# Patient Record
Sex: Male | Born: 1935
Health system: Southern US, Community
[De-identification: ages and names within clinical notes are randomized; demographics above are authoritative.]

## PROBLEM LIST (undated history)

## (undated) DIAGNOSIS — K222 Esophageal obstruction: Secondary | ICD-10-CM

## (undated) DIAGNOSIS — I739 Peripheral vascular disease, unspecified: Secondary | ICD-10-CM

## (undated) DIAGNOSIS — I722 Aneurysm of renal artery: Secondary | ICD-10-CM

## (undated) DIAGNOSIS — K219 Gastro-esophageal reflux disease without esophagitis: Secondary | ICD-10-CM

## (undated) DIAGNOSIS — Z9289 Personal history of other medical treatment: Secondary | ICD-10-CM

## (undated) DIAGNOSIS — I480 Paroxysmal atrial fibrillation: Secondary | ICD-10-CM

## (undated) DIAGNOSIS — H9190 Unspecified hearing loss, unspecified ear: Secondary | ICD-10-CM

## (undated) DIAGNOSIS — I25810 Atherosclerosis of coronary artery bypass graft(s) without angina pectoris: Secondary | ICD-10-CM

## (undated) DIAGNOSIS — G629 Polyneuropathy, unspecified: Secondary | ICD-10-CM

## (undated) DIAGNOSIS — K449 Diaphragmatic hernia without obstruction or gangrene: Secondary | ICD-10-CM

## (undated) DIAGNOSIS — I10 Essential (primary) hypertension: Secondary | ICD-10-CM

## (undated) DIAGNOSIS — C140 Malignant neoplasm of pharynx, unspecified: Secondary | ICD-10-CM

## (undated) DIAGNOSIS — D696 Thrombocytopenia, unspecified: Secondary | ICD-10-CM

## (undated) DIAGNOSIS — Z951 Presence of aortocoronary bypass graft: Secondary | ICD-10-CM

## (undated) DIAGNOSIS — K760 Fatty (change of) liver, not elsewhere classified: Secondary | ICD-10-CM

## (undated) DIAGNOSIS — N400 Enlarged prostate without lower urinary tract symptoms: Secondary | ICD-10-CM

## (undated) HISTORY — DX: Malignant neoplasm of pharynx, unspecified: C14.0

## (undated) HISTORY — DX: Gastro-esophageal reflux disease without esophagitis: K21.9

## (undated) HISTORY — DX: Aneurysm of renal artery: I72.2

## (undated) HISTORY — PX: OTHER SURGICAL HISTORY: SHX169

## (undated) HISTORY — DX: Presence of aortocoronary bypass graft: Z95.1

## (undated) HISTORY — DX: Atherosclerosis of coronary artery bypass graft(s) without angina pectoris: I25.810

## (undated) HISTORY — DX: Benign prostatic hyperplasia without lower urinary tract symptoms: N40.0

## (undated) HISTORY — DX: Esophageal obstruction: K22.2

## (undated) HISTORY — DX: Peripheral vascular disease, unspecified: I73.9

## (undated) HISTORY — DX: Polyneuropathy, unspecified: G62.9

## (undated) HISTORY — DX: Fatty (change of) liver, not elsewhere classified: K76.0

## (undated) HISTORY — PX: TONSILLECTOMY: SUR1361

## (undated) HISTORY — DX: Diaphragmatic hernia without obstruction or gangrene: K44.9

## (undated) HISTORY — DX: Essential (primary) hypertension: I10

## (undated) HISTORY — DX: Paroxysmal atrial fibrillation: I48.0

## (undated) HISTORY — DX: Gilbert syndrome: E80.4

## (undated) HISTORY — DX: Personal history of other medical treatment: Z92.89

---

## 2000-03-14 ENCOUNTER — Encounter: Payer: Self-pay | Admitting: Emergency Medicine

## 2000-03-14 ENCOUNTER — Emergency Department (HOSPITAL_COMMUNITY): Admission: EM | Admit: 2000-03-14 | Discharge: 2000-03-14 | Payer: Self-pay | Admitting: Emergency Medicine

## 2000-11-19 ENCOUNTER — Ambulatory Visit (HOSPITAL_COMMUNITY): Admission: RE | Admit: 2000-11-19 | Discharge: 2000-11-19 | Payer: Self-pay | Admitting: Family Medicine

## 2000-11-19 ENCOUNTER — Encounter: Payer: Self-pay | Admitting: Family Medicine

## 2001-04-20 ENCOUNTER — Ambulatory Visit (HOSPITAL_COMMUNITY): Admission: RE | Admit: 2001-04-20 | Discharge: 2001-04-20 | Payer: Self-pay | Admitting: Internal Medicine

## 2001-05-31 ENCOUNTER — Encounter: Payer: Self-pay | Admitting: Family Medicine

## 2001-05-31 ENCOUNTER — Ambulatory Visit (HOSPITAL_COMMUNITY): Admission: RE | Admit: 2001-05-31 | Discharge: 2001-05-31 | Payer: Self-pay | Admitting: Family Medicine

## 2001-07-08 ENCOUNTER — Ambulatory Visit (HOSPITAL_COMMUNITY): Admission: RE | Admit: 2001-07-08 | Discharge: 2001-07-08 | Payer: Self-pay | Admitting: Internal Medicine

## 2001-12-03 ENCOUNTER — Ambulatory Visit (HOSPITAL_COMMUNITY): Admission: RE | Admit: 2001-12-03 | Discharge: 2001-12-03 | Payer: Self-pay | Admitting: Internal Medicine

## 2001-12-03 ENCOUNTER — Encounter: Payer: Self-pay | Admitting: Internal Medicine

## 2002-03-30 ENCOUNTER — Ambulatory Visit (HOSPITAL_COMMUNITY): Admission: RE | Admit: 2002-03-30 | Discharge: 2002-03-30 | Payer: Self-pay | Admitting: Internal Medicine

## 2002-03-30 ENCOUNTER — Encounter: Payer: Self-pay | Admitting: Internal Medicine

## 2002-11-02 ENCOUNTER — Ambulatory Visit (HOSPITAL_COMMUNITY): Admission: RE | Admit: 2002-11-02 | Discharge: 2002-11-02 | Payer: Self-pay | Admitting: Family Medicine

## 2002-11-02 ENCOUNTER — Encounter: Payer: Self-pay | Admitting: Family Medicine

## 2002-11-18 ENCOUNTER — Ambulatory Visit (HOSPITAL_COMMUNITY): Admission: RE | Admit: 2002-11-18 | Discharge: 2002-11-18 | Payer: Self-pay | Admitting: Otolaryngology

## 2002-11-18 ENCOUNTER — Encounter: Payer: Self-pay | Admitting: Otolaryngology

## 2003-02-28 ENCOUNTER — Ambulatory Visit (HOSPITAL_COMMUNITY): Admission: RE | Admit: 2003-02-28 | Discharge: 2003-02-28 | Payer: Self-pay | Admitting: Internal Medicine

## 2003-03-30 ENCOUNTER — Ambulatory Visit (HOSPITAL_COMMUNITY): Admission: RE | Admit: 2003-03-30 | Discharge: 2003-03-30 | Payer: Self-pay | Admitting: Internal Medicine

## 2003-12-20 ENCOUNTER — Ambulatory Visit (HOSPITAL_COMMUNITY): Admission: RE | Admit: 2003-12-20 | Discharge: 2003-12-20 | Payer: Self-pay | Admitting: Family Medicine

## 2004-02-04 DIAGNOSIS — I25810 Atherosclerosis of coronary artery bypass graft(s) without angina pectoris: Secondary | ICD-10-CM

## 2004-02-04 DIAGNOSIS — Z951 Presence of aortocoronary bypass graft: Secondary | ICD-10-CM

## 2004-02-04 HISTORY — PX: ESOPHAGOGASTRODUODENOSCOPY: SHX1529

## 2004-02-04 HISTORY — DX: Atherosclerosis of coronary artery bypass graft(s) without angina pectoris: I25.810

## 2004-02-04 HISTORY — PX: COLONOSCOPY: SHX174

## 2004-02-04 HISTORY — DX: Presence of aortocoronary bypass graft: Z95.1

## 2004-04-03 HISTORY — PX: CORONARY ARTERY BYPASS GRAFT: SHX141

## 2004-04-18 ENCOUNTER — Inpatient Hospital Stay (HOSPITAL_COMMUNITY): Admission: EM | Admit: 2004-04-18 | Discharge: 2004-04-30 | Payer: Self-pay | Admitting: Emergency Medicine

## 2004-04-18 ENCOUNTER — Encounter: Payer: Self-pay | Admitting: Emergency Medicine

## 2004-05-24 ENCOUNTER — Encounter: Admission: RE | Admit: 2004-05-24 | Discharge: 2004-05-24 | Payer: Self-pay | Admitting: Cardiothoracic Surgery

## 2004-06-11 ENCOUNTER — Ambulatory Visit (HOSPITAL_COMMUNITY): Admission: RE | Admit: 2004-06-11 | Discharge: 2004-06-11 | Payer: Self-pay | Admitting: Internal Medicine

## 2004-07-22 ENCOUNTER — Ambulatory Visit: Payer: Self-pay | Admitting: Internal Medicine

## 2004-08-01 ENCOUNTER — Encounter: Payer: Self-pay | Admitting: Internal Medicine

## 2004-08-01 ENCOUNTER — Ambulatory Visit (HOSPITAL_COMMUNITY): Admission: RE | Admit: 2004-08-01 | Discharge: 2004-08-01 | Payer: Self-pay | Admitting: Internal Medicine

## 2004-08-01 ENCOUNTER — Ambulatory Visit: Payer: Self-pay | Admitting: Internal Medicine

## 2004-09-05 ENCOUNTER — Ambulatory Visit: Payer: Self-pay | Admitting: Internal Medicine

## 2004-09-17 ENCOUNTER — Ambulatory Visit: Payer: Self-pay | Admitting: Internal Medicine

## 2005-01-23 ENCOUNTER — Ambulatory Visit (HOSPITAL_COMMUNITY): Admission: RE | Admit: 2005-01-23 | Discharge: 2005-01-23 | Payer: Self-pay | Admitting: Family Medicine

## 2005-06-14 ENCOUNTER — Inpatient Hospital Stay (HOSPITAL_COMMUNITY): Admission: EM | Admit: 2005-06-14 | Discharge: 2005-06-17 | Payer: Self-pay | Admitting: Emergency Medicine

## 2005-06-16 ENCOUNTER — Ambulatory Visit: Payer: Self-pay | Admitting: Oncology

## 2005-06-16 ENCOUNTER — Ambulatory Visit: Payer: Self-pay | Admitting: Internal Medicine

## 2007-11-04 DIAGNOSIS — Z9289 Personal history of other medical treatment: Secondary | ICD-10-CM

## 2007-11-04 HISTORY — DX: Personal history of other medical treatment: Z92.89

## 2007-11-06 ENCOUNTER — Observation Stay (HOSPITAL_COMMUNITY): Admission: AD | Admit: 2007-11-06 | Discharge: 2007-11-07 | Payer: Self-pay | Admitting: Cardiology

## 2008-10-10 ENCOUNTER — Ambulatory Visit (HOSPITAL_COMMUNITY): Admission: RE | Admit: 2008-10-10 | Discharge: 2008-10-10 | Payer: Self-pay | Admitting: Family Medicine

## 2009-02-03 HISTORY — PX: CATARACT EXTRACTION: SUR2

## 2010-02-23 ENCOUNTER — Encounter: Payer: Self-pay | Admitting: Internal Medicine

## 2010-02-24 ENCOUNTER — Encounter: Payer: Self-pay | Admitting: Internal Medicine

## 2010-02-24 ENCOUNTER — Encounter: Payer: Self-pay | Admitting: Otolaryngology

## 2010-05-07 ENCOUNTER — Encounter: Payer: Self-pay | Admitting: Gastroenterology

## 2010-05-07 ENCOUNTER — Ambulatory Visit (INDEPENDENT_AMBULATORY_CARE_PROVIDER_SITE_OTHER): Payer: Medicare Other | Admitting: Gastroenterology

## 2010-05-07 VITALS — BP 138/66 | HR 61 | Temp 98.5°F | Ht 69.0 in | Wt 200.0 lb

## 2010-05-07 DIAGNOSIS — R110 Nausea: Secondary | ICD-10-CM

## 2010-05-07 DIAGNOSIS — R197 Diarrhea, unspecified: Secondary | ICD-10-CM

## 2010-05-07 DIAGNOSIS — K219 Gastro-esophageal reflux disease without esophagitis: Secondary | ICD-10-CM

## 2010-05-07 MED ORDER — ONDANSETRON HCL 4 MG PO TABS: 4.0000 mg | ORAL_TABLET | Freq: Three times a day (TID) | ORAL | Status: DC | PRN

## 2010-05-07 NOTE — Patient Instructions (Signed)
Take Protonix 30 minutes before your first meal of the day every day Call us in 5-7 days to inform us of how you are feeling. At that time, we will discuss what further evaluations need to be done. Take a probiotic daily (samples given) Supplemental fiber (samples given) Short course of nausea medicine.  If loose stools return, call our office, and we will do stool studies.

## 2010-05-08 ENCOUNTER — Encounter: Payer: Self-pay | Admitting: Gastroenterology

## 2010-05-08 ENCOUNTER — Telehealth: Payer: Self-pay | Admitting: Gastroenterology

## 2010-05-08 ENCOUNTER — Other Ambulatory Visit: Payer: Self-pay | Admitting: Gastroenterology

## 2010-05-08 DIAGNOSIS — R197 Diarrhea, unspecified: Secondary | ICD-10-CM | POA: Insufficient documentation

## 2010-05-08 DIAGNOSIS — K219 Gastro-esophageal reflux disease without esophagitis: Secondary | ICD-10-CM | POA: Insufficient documentation

## 2010-05-08 DIAGNOSIS — R110 Nausea: Secondary | ICD-10-CM | POA: Insufficient documentation

## 2010-05-08 NOTE — Progress Notes (Signed)
Primary Care Physician:  Glo Herring., MD  Chief Complaint  Patient presents with  . Abdominal Pain    diarrhea and stomach discomfort    HPI:  [name] is a 77 y.o. male here as a new patient for evaluation of nausea and loose stools. He was actually last seen by our practice in 2007. He reports occasional nausea, sometimes after eating, X 1 week now. Rare use of NSAIDs. Feels bloated at times. Taking Protonix in evenings, but he does not take regularly. Denies exacerbation of GERD. Does feel anxious since brother passing away last year. Had [place] food on Saturday and subsequently developed loose stools; he has taken imodium several times since then with a noticeable decrease in loose stools. No melena or brbpr. Does admit to taking abx 1 month ago. No sick contacts. Dull lower abdominal, non-specific and vague discomfort. +flatulence.   Past Medical History  Diagnosis Date  . CAD (coronary artery disease) of artery bypass graft 2006  . Neuropathy   . GERD (gastroesophageal reflux disease)   . HTN (hypertension)   . BPH (benign prostatic hyperplasia)   . Fatty liver     Past Surgical History  Procedure Date  . Coronary artery bypass graft 2006    placed stent for crossed coronary arteries  . Rotater cuff     repair  . Cataract extraction 2011  . Tonsillectomy   . Esophagogastroduodenoscopy 2006    non-critical Schatzki's ring, s/p 58-F Maloney dilation  . Colonoscopy 2006    scattered pancolonic diverticula    Current Outpatient Prescriptions  Medication Sig Dispense Refill  . ALPRAZolam (XANAX) 0.5 MG tablet Take 0.5 mg by mouth at bedtime as needed.        . doxazosin (CARDURA XL) 8 MG 24 hr tablet Take 8 mg by mouth daily with breakfast.        . gabapentin (NEURONTIN) 300 MG capsule Take 300 mg by mouth 3 (three) times daily.        . hydrochlorothiazide 25 MG tablet Take 12.5 mg by mouth daily.        Marland Kitchen lisinopril (PRINIVIL,ZESTRIL) 10 MG tablet Take 10 mg  by mouth daily.        Marland Kitchen loperamide (LOPERAMIDE A-D) 2 MG tablet Take 2 mg by mouth 4 (four) times daily as needed.        . metoprolol succinate (TOPROL-XL) 25 MG 24 hr tablet Take 12.5 mg by mouth daily.        . pantoprazole (PROTONIX) 40 MG tablet Take 40 mg by mouth daily.        . ondansetron (ZOFRAN) 4 MG tablet Take 1 tablet (4 mg total) by mouth every 8 (eight) hours as needed for nausea.  20 tablet  0    Allergies as of 05/07/2010  . (No Known Allergies)    Family History  Problem Relation Age of Onset  . Heart attack Mother 38    deceased  . Heart attack  80    deceased  . Colon cancer Neg Hx     History   Social History  . Marital Status: Married    Spouse Name: N/A    Number of Children: N/A  . Years of Education: N/A   Occupational History  . Not on file.   Social History Main Topics  . Smoking status: Former Smoker -- 1.0 packs/day for 40 years    Types: Cigarettes    Quit date: 02/04/2003  . Smokeless tobacco: Never  Used  . Alcohol Use: Yes     occassional drinker  . Drug Use: No  . Sexually Active: No   Other Topics Concern  . Not on file   Social History Narrative  . No narrative on file    Review of Systems: Gen: Denies any fever, chills, sweats, anorexia, fatigue, weakness CV: Denies chest pain, angina, palpitations, syncope, orthopnea, PND, peripheral edema, and claudication. Resp: Denies dyspnea at rest, dyspnea with exercise, cough, sputum, wheezing GI: See HPI GU : Denies urinary burning, blood in urine, urinary frequency, urinary hesitancy, nocturnal urination, and urinary incontinence. MS: Denies joint pain, limitation of movement, and swelling, stiffness, low back pain, extremity pain. Denies muscle weakness, cramps, atrophy.  Derm: Denies rash, itching, dry skin, hives Psych: Denies depression, anxiety, memory loss, suicidal ideation, hallucinations, paranoia, and confusion. Heme: Denies bruising, bleeding, and enlarged lymph  nodes.  Physical Exam: BP 138/66  Pulse 61  Temp 98.5 F (36.9 C)  Ht 5' 9"  (1.753 m)  Wt 200 lb (90.719 kg)  BMI 29.53 kg/m2  SpO2 95% General:   Alert,  Well-developed, well-nourished, pleasant and cooperative in NAD Head:  Normocephalic and atraumatic. Eyes:  Sclera clear, no icterus.   Conjunctiva pink. Nose:  No deformity, discharge,  or lesions. Lungs:  Clear throughout to auscultation.   No wheezes, crackles, or rhonchi. No acute distress. Heart:  Regular rate and rhythm; no murmurs, clicks, rubs,  or gallops. Abdomen: +BS, obese, large AP diameter. Non-tender/non-distended. Without rebound or guarding. No masses noted. Msk:  Symmetrical without gross deformities. Normal posture. Extremities:  Without clubbing or edema. Neurologic:  Alert and  oriented x4;  grossly normal neurologically. Skin:  Intact without significant lesions or rashes. Psych:  Alert and cooperative. Normal mood and affect.

## 2010-05-08 NOTE — Telephone Encounter (Signed)
His main complaint during OV was nausea. He had reported diarrhea was significantly improved. Let's do the following:  1. Stool studies to include:      Cdiff PCR      Giardia      Fecal lactoferrin      Stool culture  2. Imodium or Kaopectate as needed.  We will go from there. Inform pt we need to see what is causing diarrhea. May ultimately need updated colonoscopy.

## 2010-05-08 NOTE — Assessment & Plan Note (Signed)
New onset of diarrhea over weekend after eating chinese food. No melena or brbpr. Last colonoscopy in 2006. Seems to be self-limiting, as it is tapering off since this time. Has responded well to imodium. Did have course of abx over one month ago. Differentials include infectious etiology.   Probiotic daily Fiber supplement daily Stool studies if diarrhea continues or worsens. Seems to have tapered off by this visit today.

## 2010-05-08 NOTE — Telephone Encounter (Signed)
Pt would like something called in for diarrhea. He stated that was the reason he was seen but we did not give him anything. He would like it to be called in to the [facility] in [place].

## 2010-05-08 NOTE — Assessment & Plan Note (Signed)
See notes under GERD.

## 2010-05-08 NOTE — Assessment & Plan Note (Signed)
Well-controlled on Protonix. However, pt not taking daily. +nausea for approximately one week, sometimes after eating. Concern for gastritis, PUD, gastroparesis less likely but remains in the differentials. Doubt biliary component at this point. Will trial daily, scheduled PPI X 1 week and reassess.   Protonix 40 mg po 30 minutes prior to breakfast daily  Obtain labs from [employer] drawn recently Short course of Zofran secondary to nausea Consider GES, possible EGD if no symptom resolution or improvement with daily PPI dosing

## 2010-05-09 NOTE — Telephone Encounter (Signed)
Informed pt of above. Pt stated he didn't want to do stool studies right now. He wants to see if he gets any better first and he will call back if he doesn't.

## 2010-05-13 ENCOUNTER — Other Ambulatory Visit: Payer: Self-pay | Admitting: Gastroenterology

## 2010-05-24 LAB — GIARDIA ANTIGEN: Giardia Screen (EIA): NEGATIVE

## 2010-05-24 LAB — FECAL LACTOFERRIN, QUANT: Lactoferrin: POSITIVE

## 2010-05-24 LAB — STOOL CULTURE

## 2010-05-24 LAB — CLOSTRIDIUM DIFFICILE EIA: CDIFTX: NEGATIVE

## 2010-06-18 NOTE — Discharge Summary (Signed)
Shane, [name] NO.:  [id number]   MEDICAL RECORD NO.:  [id number]          PATIENT TYPE:  INP   LOCATION:  8466                         FACILITY:  [facility]   PHYSICIAN:  Tery Sanfilippo, MD     DATE OF BIRTH:  02/10/1933   DATE OF ADMISSION:  11/06/2007  DATE OF DISCHARGE:  11/07/2007                               DISCHARGE SUMMARY   DISCHARGE DIAGNOSES:  1. Bilateral arm pain, questionable anginal equivalent.  His CK-MBs      were negative x3 and troponins negative x3.  2. Known coronary artery disease with prior history of coronary artery      bypass graft x3 by Dr. Tharon Aquas Trigt in March 2006.  He had an      left internal mammary artery to his left anterior descending,      saphenous vein graft to his ramus intermedius, and saphenous vein      graft to his posterior descending artery.  3. History of gastroesophageal reflux disease and an esophageal spasm.  4. Peripheral neuropathy.  5. Hypertension.  6. History of recurrent thrombocytopenia, not on aspirin.  7. Benign prostatic hypertrophy.  8. History of transaminitis and fatty liver.  9. History of bilateral shoulder problems with rotator cuff surgery on      the left and right shoulder rotator cuff apparently has received      injections recently.  10.History of ventral and hiatal hernia.   LABORATORY DATA:  Total cholesterol is 106, triglycerides 42, HDL 44,  and LDL 54.  Sodium 137, potassium 3.8, chloride 102, CO2 24, glucose  100, BUN 16, creatinine 1.27, and total bili is 2.2.  AST is 56 and ALT  is 53.  Hemoglobin 12.7, hematocrit 36.4, WBCs 4.6, and platelets 114.  MCV is 102.2, TSH is 3.750, and magnesium 2.2.  CK-MB and troponin  negative x3.  Chest x-ray showed mild cardiomegaly.  No acute disease  status post CABG.   HOSPITAL COURSE:  [name] is 77 year old white married male with  known coronary artery disease.  He was transferred from [place]  because of arm pain.  About 1  or 2 days ago, he started having bilateral  arm discomfort.  He has a lot of shoulder problems and was unsure if  this was related to his shoulders or his heart.  Apparently, when he  initially was diagnosed with coronary artery disease, he had arm  discomfort.  The night prior to admission, the discomfort was kind of  escalated, it was nagging and he decided to go to his primary care  doctor.  On Saturday morning, he went to see somebody in Dr. Nolon Rod  office, they referred him to [facility].  [facility] did some labs  and called our doctors who was on call, Dr. Einar Gip and he was transferred  here for further eval.  He has had no associated shortness of breath,  diaphoresis, nausea, vomiting, presyncope, or palpitations with his arm  discomfort.  He works on a regular basis.  It does not seem to occur at  those times.  It was decided to admit him for observation and his CK-MBs  and troponins came back negative.  His EKG was negative for any  ischemia.  He was seen by Dr. Tery Sanfilippo on November 07, 2007, and  decided he could be discharged to home with outpatient Myoview and then  follow up with Dr. Chase Picket.   DISCHARGE MEDICATIONS:  1. Hydrochlorothiazide 25 mg half every day.  2. Doxazosin 8 mg one everyday.  3. Neurontin 300 mg 3 times per day.  4. Lisinopril 10 mg everyday.  5. Protonix 40 mg everyday.  6. Nizoral nose spray p.r.n.  7. Norvasc 2.5 mg every other day.  8. _________ 10 mg a half p.r.n.  9. Nitroglycerin 150 sublingual every 5 minutes x3 p.r.n.  10.Imdur 30 mg everyday.  11.Metoprolol 25 mg half twice per day.  Imdur and metoprolol are new.   Our office will call him with his appointments for his treadmill Myoview  and follow up with Dr. Rex Kras.  To note, his blood pressure on admission  was elevated at 177/67.  On the morning of discharge, his blood pressure  was 136/66 and heart rate was 63.      Cyndia Bent, N.P.      Tery Sanfilippo,  MD  Electronically Signed    BB/MEDQ  D:  11/07/2007  T:  11/08/2007  Job:  [id number]   cc:   Sherrilee Gilles. Gerarda Fraction, MD

## 2010-06-21 NOTE — Op Note (Signed)
NAME:  [name], [name]               ACCOUNT NO.:  [id number]   MEDICAL RECORD NO.:  [id number]          PATIENT TYPE:  OUT   LOCATION:  RAD                           FACILITY:  [facility]   PHYSICIAN:  Tharon Aquas Trigt III, M.D.DATE OF BIRTH:  August 13, 1933   DATE OF PROCEDURE:  DATE OF DISCHARGE:                                 OPERATIVE REPORT   OPERATION:  Coronary artery bypass grafting x3 (left internal mammary artery  to diagonal, saphenous vein graft to ramus intermediate, saphenous vein  graft to posterior descending).   PREOPERATIVE DIAGNOSIS:  Class IV unstable angina with severe 2 vessel  coronary artery disease.   POSTOPERATIVE DIAGNOSIS:  Class IV unstable angina with severe 2 vessel  coronary artery disease.   SURGEON:  Ivin Poot, M.D.   ASSISTANT:  Jadene Pierini PA-C.   ANESTHESIA:  General.   INDICATIONS:  The patient is a 77 year old male smoker who presented with  unstable angina and ruled out for MI.  Cardiac catheterization by Dr. Aldona Bar demonstrated high-grade proximal diagonal stenosis, high-grade  proximal circumflex disease and mid posterior descending stenosis. His EF  was preserved.  He was felt to be a candidate for surgical revascularization  as the vessels were too small for percutaneous intervention with stents.   Prior to surgery, I examined the patient in his room several times and  reviewed results of the cardiac catheterization with the patient and his  wife. I discussed the indications and expected benefits of coronary bypass  surgery for treatment for coronary artery disease.  I discussed with the  patient the alternatives to him besides coronary bypass surgery and the  expected outcome of those alternative therapies. I discussed with the  patient and wife the major aspects of the planned procedure including choice  of conduit to include mammary artery and endoscopically harvested saphenous  vein, the location of the surgical incisions, use of  general anesthesia and  cardiopulmonary bypass, and the expected postoperative hospital recovery. I  discussed with the patient the risks to him of coronary bypass surgery  including risks of MI, CVA, bleeding, blood transfusion requirement,  infection, and death.  He understood these implications for the surgery and  understood that his heavy smoking history preoperatively would place him at  increased risk for pulmonary problems after surgery.   After several discussions on all these issues, he demonstrated his  understanding and agreed to proceed with the operation as planned under what  I felt was an informed consent.   OPERATIVE FINDINGS:  The left-sided coronary vessels were all very small.  The mammary artery was a good conduit. The saphenous vein was a good conduit  but small below the knee. The patient was given a unit of packed cells for a  hemoglobin of 7 g.  The patient was given a unit of platelets following  following reversal of heparin with protamine for a platelet count of 80,000.  This patient's LAD would not be a graftable target.  This patient's  circumflex vessels are not graftable due to their small size.   PROCEDURE:  The patient was brought to the operating room, placed supine on  the operating table where general anesthesia was induced under invasive  hemodynamic monitoring.  The chest, abdomen and legs were prepped with  Betadine and draped as a sterile field. A sternal incision was made as the  saphenous vein was harvested endoscopically from the right leg. The left  internal mammary artery was harvested as a pedicle graft from its origin at  the subclavian vessels and was a good vessel with excellent flow. Heparin  was administered and the ACT was documented as being therapeutic. The  sternal retractor was placed and the pericardium was opened and suspended.  Pursestrings were placed in the ascending aorta and right atrium and the  patient was cannulated and  placed on bypass.   The coronaries were identified for grafting and cardioplegia catheters were  placed for both antegrade aortic and retrograde coronary sinus cardioplegia.  The patient was cooled to 30 degrees. Aortic crossclamp was applied. Then  800 cc of cold blood cardioplegia was delivered in split doses between the  antegrade aortic and retrograde coronary sinus catheters. There was good  cardioplegic arrest.  Septal temperature dropped to less than 12 degrees.  Topical iced saline was used to augment myocardial preservation. A  pericardial insulator pad was used to protect the left phrenic nerve.   The distal coronary anastomoses were then performed. The first distal  anastomosis was to the posterior descending. There is a 1.75 mm vessel with  a mid 50-60% stenosis.  A reverse saphenous vein was sewn end-to-side with a  running 7-0 Prolene with good flow through the graft. The second distal  anastomosis was the ramus intermediate. This was placed just under the  atrial appendage.  There is a small 1.4 mm vessel with proximal 80%  calcified stenosis.  A reverse saphenous vein was sewn end-to-side with  running 7-0 Prolene with good flow through the graft. The third distal  anastomosis was to the diagonal branch of the LAD. There is a 1.5 mm vessel  with proximal 80% stenosis. A running 8-0 Prolene anastomosis was  constructed with the left IMA pedicle which was brought through an opening  in the pericardium down to the diagonal vessel.  There is excellent flow  through the anastomosis after briefly flashing the mammary pedicle bulldog  clamp and then reapplying the bulldog. The pedicle was secured to the  epicardium and cardioplegia was redosed.   While the crossclamp was still in place, 2 proximal vein anastomoses were  placed on the ascending aorta using a 4.0 mm punch with running 6-0 Prolene. Air was vented from the coronaries and the left side of the heart using a  dose of  retrograde, warm blood cardioplegia and the usual de-airing  maneuvers on bypass. The crossclamp was removed as the last proximal  anastomosis was tied down.   The heart was cardioverted back to a regular rhythm. Air was aspirated from  the vein grafts with a 27 gauge needle, and the vein grafts were opened and  perfused.  There is good hemostasis at the proximal and distal anastomoses  with excellent flow. The cardioplegic catheters were removed. The patient  was rewarmed to 37 degrees. The temporary pacing wires were applied.  Lungs  re-expanded and the ventilator was resumed. The patient was weaned from  bypass without difficulty. Cardiac output and hemodynamics were stable.  Protamine was administered without adverse reaction. The cannulas were  removed. The mediastinum was irrigated with  warm antibiotic irrigation.   Leg incision was irrigated and closed in a standard fashion. The superior  pericardial fat was closed over the aorta and vein grafts. Two mediastinal  and a left pleural chest tube were placed and brought out through separate  incisions. The sternum was closed with interrupted steel wire. The  pectoralis fascia was closed in a running #1 Vicryl. The subcutaneous and  skin layers were closed in a running Vicryl and sterile dressings were  applied. Total bypass time was 90 minutes with crossclamp time of 55  minutes.    PV/MEDQ  D:  04/25/2004  T:  04/25/2004  Job:  [id number]

## 2010-06-21 NOTE — Consult Note (Signed)
NAME:  [name], [name]               ACCOUNT NO.:  [id number]   MEDICAL RECORD NO.:  [id number]          PATIENT TYPE:  INP   LOCATION:  A327                          FACILITY:  [facility]   PHYSICIAN:  R. Garfield Cornea, M.D. DATE OF BIRTH:  Oct 04, 1933   DATE OF CONSULTATION:  06/16/2005  DATE OF DISCHARGE:                                   CONSULTATION   REQUESTING PHYSICIAN:  Dr. Gerarda Fraction   REASON FOR CONSULTATION:  Diarrhea, abdominal pain.   HISTORY OF PRESENT ILLNESS:  [name] is a 77 year old Caucasian male who  developed acute onset of weakness, flushing, abdominal cramping, and watery  diarrhea while playing golf three days ago.  [name] has had anywhere  from three to 12 bowel movements a day.  This has gradually improved since  his admission.  He also had low-grade fever at home, hiccups, nausea without  any emesis or anorexia for two days.  His appetite has now returned.  He was  given Cipro and had had one dose on Saturday through Dr. Nolon Rod office.  He  did take multiple Lonox over the weekend, upwards of 12 per day.  He had a  CT scan on Jun 15, 2004 which showed a mild small bowel ileus pattern, mild  colonic ticks, fatty liver, and a large prostate.  C. difficile and stool  studies are pending.  Interestingly, his wife has been to the ER this  morning for similar symptoms including diarrhea.  NASH.  Will review old  records.  This may be contributing to his thrombocytopenia.   Anemia.  Hemoglobin 11.5 today down from 13.  Recent colonoscopy reassuring.  Hemoccult status unknown.  I suspect it may be positive at this point given  acute GI illness.   PAST MEDICAL HISTORY:  1.  Coronary artery disease status post CABG in March of 2006.  2.  Chronic GERD well controlled on PPI.  He has a history of EGD August 01, 2004 by Dr. Gala Romney which showed a subtle, non-critical Schatzki's ring      which was dilated with 58-French [facility] dilators, slight granularity of      the  duodenal mucosa which was biopsy benign.  3.  He has an upper abdominal hernia.  4.  Hypertension.  5.  Neuropathy.  6.  Left shoulder surgery.  7.  BPH.  8.  History of transaminitis and fatty liver.  9.  Tonsillectomy.  10. He had a colonoscopy on August 01, 2004 which showed scattered __________      diverticula by Dr. Gala Romney.   MEDICATIONS PRIOR TO ADMISSION:  1.  Doxazosin maleate 8 mg daily.  2.  Neurontin 300 mg t.i.d.  3.  Lonox 2.5/0.025 mg two p.o. q.i.d.  4.  Lisinopril 10 mg b.i.d.  5.  Hydrochlorothiazide 12.5 mg daily p.r.n.  6.  Cipro 500 mg b.i.d. since Saturday.  7.  Protonix 40 mg daily.   ALLERGIES:  UNKNOWN ANTIHISTAMINE causes anxiety.   FAMILY HISTORY:  [name] parents are both deceased mother at age 64 and  father age  80 secondary to MI.  He has four brothers with history  significant for coronary artery disease, one daughter deceased secondary to  motor vehicle accident.   SOCIAL HISTORY:  [name] is retired from [employer].  He  has been married for 46 years.  He lost a daughter in 31 secondary to [place].  He has a 40-pack-year history of tobacco use quitting two years ago.  He  drinks alcohol rarely, denies any drug use.  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   REVIEW OF SYSTEMS:  CONSTITUTIONAL:  Weight is stable.  See HPI.  CARDIOVASCULAR:  Denies __________  palpitation.  PULMONARY:  Denies  shortness of breath, dyspnea, cough, hemoptysis.  GU:  Denies any dysuria,  hematuria, increased urinary frequency.  GI:  See HPI.   PHYSICAL EXAMINATION:  VITAL SIGNS:  Temperature 97.9, pulse 66,  respirations 20, blood pressure 151/83, height 69 inches, weight 194.4  pounds.  GENERAL:  [name] is a 77 year old Caucasian male who is alert, oriented,  pleasant, cooperative, in no acute distress.  HEENT:  Sclerae clear.  Non-icteric.  Conjunctivae pink.  Oropharynx pink  and moist without any lesions.  NECK:  Supple  without any mass or thyromegaly.  CHEST:  Regular rate and rhythm.  Normal S1, S2 without any murmurs, clicks,  rubs, or gallops.  LUNGS:  Clear to auscultation bilaterally.  ABDOMEN:  Protuberant with positive bowel sounds x4.  No wheeze auscultated.  He does have diastasis rectus.  Abdomen is soft, nontender, mildly  distended.  There is no hepatosplenomegaly or mass, although examination is  limited given patient's body habitus.  No rebound tenderness or guarding.  EXTREMITIES:  Without clubbing or edema bilaterally.  SKIN:  Pink, warm, and dry without any rash or jaundice.   LABORATORIES:  WBC 5.4, hemoglobin 11.5, hematocrit 33.1, platelets 124.  Calcium 7.9, sodium 135, potassium 3.6, chloride 105, CO2 23, BUN 18,  creatinine 1.3, glucose 105.  Total bilirubin 2.6, alkaline phosphatase 53,  AST 79, ALT 54, total protein 6.8, albumin 3.9.   IMPRESSION:  [name] is a 77 year old Caucasian male with acute  onset of weakness, diarrhea, abdominal cramping, and hiccups.  CT showed  nonspecific small bowel ileus pattern.  I suspect [name] has  gastroenteritis which is resolving versus food borne illness.  Interestingly, his wife also has similar symptoms.  I doubt Clostridium  difficile is the culprit as [name] only had one dose of Cipro.   [name] has history of chronic gastroesophageal reflux disease well  controlled on PPI.   [name] has history of transaminitis and non-alcoholic steatohepatitis.  Will review old records.   PLAN:  1.  Await stool studies.  2.  Supportive management.  3.  Dr. Tressie Stalker has seen [name] regarding thrombocytopenia.   We would like to thank Dr. Gerarda Fraction for allowing [place] to participate in the care  of [name].      Les Pou, N.P.      Bridgette Habermann, M.D.  Electronically Signed   KC/MEDQ  D:  06/16/2005  T:  06/16/2005  Job:  [id number]

## 2010-06-21 NOTE — Op Note (Signed)
NAME:  [name], [name]               ACCOUNT NO.:  [id number]   MEDICAL RECORD NO.:  [id number]          PATIENT TYPE:  AMB   LOCATION:  DAY                           FACILITY:  [facility]   PHYSICIAN:  R. Garfield Cornea, M.D. DATE OF BIRTH:  October 13, 1933   DATE OF PROCEDURE:  08/01/2004  DATE OF DISCHARGE:                                 OPERATIVE REPORT   PROCEDURE PERFORMED:  Esophagogastroduodenoscopy with Venia Minks dilatation,  followed by diagnostic colonoscopy.   INDICATIONS FOR PROCEDURE:  The patient is a 77 year old gentleman with  intermittent esophageal dysphagia now with three out of three Hemoccult card  positive reportedly through the [place] [facility].  He has not had any  melena or rectal bleeding.  Last colonoscopy in 2003.   Esophagogastroduodenoscopy and colonoscopy are now being done.  This  approach has been discussed with the patient at length, potential risks,  benefits and alternatives have been reviewed and questions answered.  The  patient is agreeable.   Oxygen saturations, blood pressure, pulse and respirations were monitored  throughout the entirety of both procedures.   CONSCIOUS SEDATION:  Versed 5 mg IV, Demerol 125 mg IV in divided doses.   INSTRUMENT USED:  Olympus video chip system.   FINDINGS:  EGD:  Examination of the tubular esophagus revealed a  questionable subtle Schazke's ring, otherwise esophageal mucosa appeared  normal and the esophageal lumen was patent all the way through the EG  junction.  Stomach:  The gastric cavity was emptied and insufflated well with air.  Thorough examination of the gastric mucosa including retroflex view of the  proximal stomach, esophagogastric junction demonstrated no abnormalities.  Pylorus was patent and easily traversed.  Examination of the bulb and second  portion revealed somewhat granular mucosa.  This granularity would not wash  away.   THERAPY/DIAGNOSTIC MANEUVERS PERFORMED:  Biopsies times two at D2 were  taken  for histologic study.  Subsequently, a 7 French Maloney dilator was passed  to full insertion with ease and good patient tolerance, a look back revealed  no apparent complications related to passage of the dilator.   Colonoscopy findings:  Digital rectal exam revealed no abnormalities.   Endoscopic findings:  Prep was adequate.   Rectum:  Examination of the rectal mucosa including retroflexion to the anal  verge revealed no abnormalities.  Colon:  Colonic mucosa surveyed from the rectosigmoid junction through the  left, transverse, right colon to the area of the appendiceal orifice and  ileocecal valve and cecum.  These structures were well seen and photographed  for the record.  From this level  the scope was slowly withdrawn and all  previously mentioned mucosal surfaces were again seen.  The only abnormality  observed were scattered pancolonic diverticulum. Remainder of colonic mucosa  appeared normal.  The patient tolerated the above procedures well, was  reacted in endoscopy.   IMPRESSION:  1.  Questionable subtle, noncritical Schatzke's ring, status post dilation      as described above.  Otherwise normal esophagus.  2.  Normal stomach.  Slight granularity of the duodenal mucosa of uncertain  clinical significance.  Biopsied.   Colonoscopic findings:  Normal rectum, scattered pancolonic diverticula.  Remainder of colonic mucosa appeared normal.   RECOMMENDATIONS:  1.  CBC today.  2.  Diverticulosis literature provided to Mr. Avery.  3.  Follow-up on pathology.  4.  Further recommendations to follow.       RMR/MEDQ  D:  08/01/2004  T:  08/01/2004  Job:  [id number]

## 2010-06-21 NOTE — Discharge Summary (Signed)
[name], [name]               ACCOUNT NO.:  [id number]   MEDICAL RECORD NO.:  [id number]          PATIENT TYPE:  INP   LOCATION:  A327                          FACILITY:  [facility]   PHYSICIAN:  Sherrilee Gilles. Gerarda Fraction, MD  DATE OF BIRTH:  August 29, 1933   DATE OF ADMISSION:  06/14/2005  DATE OF DISCHARGE:  05/15/2007LH                                 DISCHARGE SUMMARY   DISCHARGE DIAGNOSES:  1.  Gastroenteritis.  2.  Recurrent thrombocytopenia.  3.  __________ hepatitis, chronic.  4.  Stable coronary artery disease status post CABG in March 2006.  5.  Chronic stable gastroesophageal reflux disease.  6.  Peripheral neuropathy.  7.  Benign prostatic hypertrophy.   DISCHARGE MEDICATIONS:  1.  Doxazosin 8 mg p.o. q.h.s.  2.  Neurontin 300 mg p.o. t.i.d.  3.  Lovenox p.o. q.i.d. p.r.n.  4.  Lisinopril 10 mg p.o. b.i.d.  5.  HCTZ 12.5 mg p.o. daily.  6.  Cipro 500 mg b.i.d., discontinued.  7.  Protonix 40 mg p.o. daily.   SUMMARY:  The patient was admitted with intractable explosive diarrhea and  an ileus-type pattern on his acute abdomen series.  He underwent evaluation  which included CT of the abdomen to rule out partial bowel obstruction, and  this was negative for same.  He responded well to lactose-free diet  following clear liquids as well as symptomatic therapy and IV hydration.  He  was seen in consultation by Dr. Tressie Stalker for mild thrombocytopenia as well  as Dr. Gala Romney for enteritis symptoms.  On the day of discharge, he was  asymptomatic, afebrile with stable vital signs.  He will resume his usual  medications.  Discharged on a lactose free diet.      Sherrilee Gilles. Gerarda Fraction, MD  Electronically Signed     LJF/MEDQ  D:  06/17/2005  T:  06/17/2005  Job:  [id number]

## 2010-06-21 NOTE — Op Note (Signed)
[facility]  Patient:    [name], [name] Visit Number: [id number] MRN: [id number]          Service Type: END Location: DAY Attending Physician:  Bridgette Habermann Dictated by:   Garfield Cornea, M.D. Proc. Date: 07/08/01 Admit Date:  07/08/2001   CC:         Elsie Lincoln, M.D.   Operative Report  PROCEDURE:  Diagnostic colonoscopy.  INDICATIONS FOR PROCEDURE:  The patient is a 77 year old gentleman followed by Dr. Orson Ape, referred for colonoscopy. Mr. Fetterman has occasional blood per rectum in a setting of constipation. He also has an elevated CEA of 8.9 but does smoke. Colonoscopy is now being done to further evaluate the intermittent rectal bleeding/elevated CEA. This approach has been discussed with Mr. Holte previously and again today at the bedside. The potential risks, benefits, and alternatives have been reviewed, questions answered. He is agreeable. He is low risk for conscious sedation.  Please see my handwritten H&P for more information.  MONITORING:  O2 saturations, blood pressure, pulse and respirations were monitored throughout the entire procedure.  CONSCIOUS SEDATION:  Versed 5 mg IV in divided doses, Demerol 75 mg IV.  INSTRUMENT:  Olympus video chip colonoscope.  FINDINGS:  Digital rectal exam revealed no abnormalities.  ENDOSCOPIC FINDINGS:  The prep was good.  RECTUM:  Examination of the rectal mucosal including a retroflexed view of the anal verge revealed and en face view of the anal canal revealed some anal canal hemorrhoids, otherwise rectal mucosa appeared normal.  COLON:  The colonic mucosa was surveyed from the rectosigmoid junction through the left transverse right colon to the area of the appendiceal orifice, ileocecal valve, and cecum. These structures were well seen and photographed for the record. The patient had a few scattered sigmoid diverticula. The remainder of the colonic mucosa appeared normal. The  terminal ileum was intubated to 5 cm and this segment of the GI tract also appeared normal. From this level, the scope was slowly and cautiously withdrawn.  All previously mentioned mucosal surfaces were again seen and no other abnormalities were observed. The patient tolerated the procedure well and was reactive in endoscopy.  IMPRESSION:  1. Anal canal hemorrhoids otherwise normal rectum.  2. A few scattered sigmoid diverticula, remainder of colonic mucosa and     terminal ileum appeared normal.  RECOMMENDATIONS:  1. Hemorrhoids/diverticulosis literature provided to Mr. Basquez. Daily     Metamucil and Citrucel fiber supplement.  2. Anusol-HC suppositories one per rectum at bedtime x10 days.  3. Mr. Haq should consider having a repeat colonoscopy in 10 years. He is     to follow-up with Dr. Orson Ape. Dictated by:   Garfield Cornea, M.D. Attending Physician:  Bridgette Habermann DD:  07/08/01 TD:  07/09/01 Job: [id number]

## 2010-06-21 NOTE — H&P (Signed)
NAME:  [name], [name]               ACCOUNT NO.:  [id number]   MEDICAL RECORD NO.:  [id number]           PATIENT TYPE:  AMB   LOCATION:  DAY                           FACILITY:  [facility]   PHYSICIAN:  R. Garfield Cornea, M.D.      DATE OF BIRTH:   DATE OF ADMISSION:  08/01/2004  DATE OF DISCHARGE:  LH                                HISTORY & PHYSICAL   CHIEF COMPLAINT:  Three out of three Hemoccults positive.  Dysphagia.   [name] is a pleasant 77 year old gentleman with history of  coronary artery disease who just had a CABG performed back in March of this  year.  Apparently his angina symptoms were somewhat atypical.  He was  complaining of some esophageal dysphagia at that time, was seen by Dr.  Cristina Gong.  He felt that his dysphagia symptoms could be worked up once his  CABG was performed.  He has a long history of esophageal dysphagia.  I  performed multiple Venia Minks dilations on him over the years for esophageal  dysphagia.  All endostructural abnormality has been found previously.  He  does respond to [facility] dilations; last EGD was in 2003.  Barium pill  esophagogram since that time has demonstrated no structural abnormality.  No  impediment to the passage of the barium pill.  He also had a modified barium  swallow which did not demonstrate any oropharyngeal abnormalities.  Since  being seen by Dr. Cristina Gong, he went to the [place] and they had him do three  Hemoccults, all reportedly positive, although we do not have any  documentation in the chart.  He has not had any melena, rectal bleeding and  no abdominal pain.  I last saw this gentleman back in February of 2005 in  the office.  He weighed 190.5 pounds and he weighs 195.5 pounds now.   PAST MEDICAL HISTORY:  1.  Significant coronary artery disease.  2.  Status post subendocardial MI.  3.  Status post CABG.  4.  History of hypertension.  5.  History of neuropathy.  6.  History of GERD, which he takes Protonix.   He has had  multiple colonoscopies along the way for elevated CEAs (without a  prior diagnosis of colon cancer).  His last colonoscopy was for some low  volume hematochezia on July 08, 2001 performed by me at [facility].  He was found to have __________ hemorrhoids and a couple of left-sided  diverticula.  That was all.   Other surgeries include tonsillectomy and left shoulder surgery.  Also has  BPH.   CURRENT MEDICATIONS:  1.  Neurontin 300 mg t.i.d.  2.  Vitamin E 400 international units daily.  3.  HCTZ 25 mg tablet, half daily.  4.  Doxazosin 8 mg daily.  5.  Lisinopril 10 mg daily.  6.  Pantoprazole 40 mg daily.   ALLERGIES:  No known drug allergies.   FAMILY HISTORY:  No history of chronic GI or liver illness, although an  uncle may have had gastric cancer.   SOCIAL HISTORY:  The patient has  been married for 43 years.  He is retired  from the [employer].  He stopped smoking.  He occasionally has  an alcoholic beverage.   REVIEW OF SYSTEMS:  As in history of present illness.   PHYSICAL EXAMINATION:  GENERAL:  A pleasant 77 year old gentleman, resting  comfortably.  VITAL SIGNS:  Weight 195.5, height 5 feet 9-1/2 inches.  Temperature 98,  blood pressure 148/68, pulse 76.  SKIN:  Warm and dry.  HEENT:  No scleral icterus.  JVD is not prominent.  Oral cavity has a  partial plate.  CHEST:  Lungs clear to auscultation.  CARDIAC:  Regular rate and rhythm without murmur, gallops or rub.  Median  sternotomy site well healed.  ABDOMEN:  Nondistended.  Positive bowel sounds.  Soft, nontender.  No  appreciable mass or organomegaly.  RECTAL:  Exam deferred until time of colonoscopy.   IMPRESSION:  [name] is a 77 year old gentleman with intermittent  esophageal dysphagia, who now has three out of three Hemoccult cards  positive through the [facility].   He has had multiple colonoscopies previously, the last one being 2003  without significant  findings.   Esophageal dysphagia may be related to a motility disorder, but he does  enjoy significant relief in symptoms after getting his esophagus dilated.   RECOMMENDATIONS:  We have offered Mr. Bridge both an EGD with probable  esophageal dilatation and a diagnostic colonoscopy in the very near future.  Potential risks, benefits, alternatives have been reviewed and  questions answered.  He is agreeable.  Not mentioned above, apparently had a  mild transaminitis during his hospitalization back in March of this year.  According to Dr. Osborn Coho note, will see that he has a hepatic profile  repeated in the near future.       RMR/MEDQ  D:  07/22/2004  T:  07/22/2004  Job:  [id number]   cc:   Bonne Dolores, M.D.  [address] [place] [address]  Fax: Jefferson Little, M.D.  [address]  [place] [address]  Fax: [phone]

## 2010-06-21 NOTE — Op Note (Signed)
[facility]  Patient:    [name], [name] Visit Number: [id number] MRN: [id number]          Service Type: END Location: DAY Attending Physician:  Bridgette Habermann Dictated by:   Garfield Cornea, M.D. Proc. Date: 04/15/01 Admit Date:  04/20/2001 Discharge Date: 04/20/2001   CC:         Bonne Dolores, M.D.   Operative Report  PROCEDURE:  Esophagogastroduodenoscopy with [facility] dilation.  ENDOSCOPIST:  Garfield Cornea, M.D.  INDICATIONS:  The patient is a 77 year old gentleman with recurrent esophageal dysphagia.  He had similar symptoms back in 1999 for which I performed an EGD. No structural lesion was found.  I passed a 23 [place] Maloney dilator.  This was associated with marked improvement of his dysphagia until recently.  He does have some reflux symptoms, but they are well controlled on what sounds like generic Prilosec 20 mg orally daily.  The EGD is being done now to further evaluate his symptoms.  Approach has been discussed with the patients wife previously in my office and today at the bedside.  Potential risks, benefits, and alternatives have been reviewed and questions answered.  He is agreeable. Please see my dictated history and physical exam for more information.  DESCRIPTION OF PROCEDURE:  The O2 saturation, blood pressure, pulse and respirations were monitored throughout the procedure, conscious sedation. Intravenous Versed and Demerol.  Cetacaine spray for topical pharyngeal anesthesia.  Olympus is video diagnostic gastroscope.  Findings:  Examination of the tubular esophagus revealed no mucosal abnormalities.  There was a 2 x 2 cm extrinsic compression which was not significantly intruding on the diameter of the esophagus in the proximal third.  Please see photos.  This again was extrinsic lesion with a widely patent esophageal lumen.  I was not able to identify a ring or stricture or other abnormality.  The EG junction was easily  traversed. On entering the stomach, gastric cavity insufflated well with air.  It was emptied.  Thorough examination of the gastric mucosa including a retroflexed view of the proximal stomach, esophagogastric junction demonstrated no mucosal abnormalities.  The pylorus was patent and easily traversed.  The duodenum, bulb and second portion appeared normal.  Therapeutic/diagnostic maneuvers performed:  The 71 French Maloney dilator was passed with good patient tolerance and without blood or turn of the dilator. A look back revealed no apparent complication related to passage of the dilator.  ADDENDUM:  Incidentally, when passing the scope initially, I noted a very small 2-3 mm AVM on his right arytenoid photos.  The patient tolerated the procedure well and was reactive in endoscopy.  IMPRESSION: 1. Noncritical extrinsic compression of the proximal esophagus. 2. The remainder of the esophagus, stomach and duodenum through the    second portion appeared normal status post passage of 56 [place]    Maloney dilator. 3. 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  RECOMMENDATIONS: 1. Continue omeprazole 20 mg orally for gastroesophageal reflux disease. 2. Hopefully todays dilation will provide the patient with years of relief    from dysphagia. 3. He is to follow up with Dr. Drenda Freeze of [facility]. Dictated by:   Garfield Cornea, M.D. Attending Physician:  Bridgette Habermann DD:  04/20/01 TD:  04/22/01 Job: [id number]

## 2010-06-21 NOTE — Cardiovascular Report (Signed)
[name], [name]               ACCOUNT NO.:  [id number]   MEDICAL RECORD NO.:  [id number]          PATIENT TYPE:  OUT   LOCATION:  RAD                           FACILITY:  [facility]   PHYSICIAN:  Jeanella Craze. Little, M.D. DATE OF BIRTH:  [birth year]   DATE OF PROCEDURE:  04/18/2004  DATE OF DISCHARGE:                              CARDIAC CATHETERIZATION   INDICATIONS:  This 77 year old male has a long history of GI type complaints  and what sounds like esophageal spasm/stricture. He has complained of  bilateral arm pain predominately after he eats not necessarily with physical  activity. A nuclear study had been performed on January 03, 2004 that was  negative for ischemia.   He presents to the emergency room with his arm pain, has borderline  troponins and because of this is brought to the cath lab for resolution of  the cardiac issue.   DESCRIPTION OF PROCEDURE:  After obtaining informed consent, the patient was  prepped and draped in the usual sterile fashion exposing the right groin.  Following local anesthetic with 1% Xylocaine, the Seldinger technique was  employed and a 5-French introducer sheath was placed in the right femoral  artery. Left and right coronary arteriography and ventriculography in the  RAO projection was performed.   EQUIPMENT:  5-French Judkins configuration catheters.   TOTAL CONTRAST USED:  85 cc.   RESULTS:  1.  Hemodynamic monitoring central aortic pressures 203/88.  2.  Left ventricular pressures 211/12 and there was no significant aortic      valve gradient at time of pullback.   VENTRICULOGRAPHY:  Ventriculography in the RAO projection revealed normal LV  systolic function with ejection fraction excess of 60%. Left ventricular end-  diastolic pressure was only 15 and there was normal systolic function.   CORONARY ARTERIOGRAPHY:  1.  Left main normal.  2.  LAD: The LAD was a small vessel that extended towards the apex of the      heart. It gave  rise to a small diagonal vessel. The ostium of the      diagonal vessel was 90% narrowed.  3.  Circumflex:  There is ostial 60-80% narrowing of the circumflex. The      first OM has ostial 80% narrowing and in the midportion of the vessel      before it bifurcates into two branches. There is another area of 80%      narrowing. The mid circumflex has an area of 50% narrowing.  4.  The right coronary artery:  The right coronary artery is a huge vessel      about 4.5 mm. There is mild irregularities. The greatest significance is      in the midportion at about 40%. The posterior lateral vessel is free of      disease and enlarged. The PDA had an area of 40% narrowing in its      midportion.   CONCLUSION:  1.  Ostial circumflex, diagonal and obtuse marginal disease. All of which is      not amenable by percutaneous intervention.  2.  Normal left  ventricular function.  3.  Hypertension. The patient was given a total of 40 mg of IV labetalol 20      mg IV hydralazine and 1 mg of p.o. clonidine.  4.  Because of the smallness of the left system, I have asked my colleagues      for consideration of reviewing the films and their opinion regarding      bypass surgery. For now, we will add beta blockers to his current      medications. end of the note on Mr. Meiklejohn thank you      ABL/MEDQ  D:  04/18/2004  T:  04/18/2004  Job:  [id number]   cc:   Sherrilee Gilles. Gerarda Fraction, MD  [address] [place] [address]  Fax: [phone]   Catheter Lab

## 2010-06-21 NOTE — Consult Note (Signed)
[name], [name] NO.:  [id number]   MEDICAL RECORD NO.:  [id number]          PATIENT TYPE:  INP   LOCATION:  2620                         FACILITY:  [facility]   PHYSICIAN:  Tharon Aquas Trigt III, M.D.DATE OF BIRTH:  09/16/1933   DATE OF CONSULTATION:  04/19/2004  DATE OF DISCHARGE:                                   CONSULTATION   REQUESTING PHYSICIAN:  Aldona Bar, MD   PRIMARY CARE PHYSICIAN:  Redmond School, MD in [place].   REASON FOR CONSULTATION:  Severe coronary artery disease with subendocardial  MI and unstable angina.   CHIEF COMPLAINT:  Chest pain.   HISTORY OF PRESENT ILLNESS:  I was asked to evaluate this 77 year old white  male smoker for potential surgical coronary revascularization for recently  diagnosed severe coronary artery disease based on cardiac catheterization  performed yesterday on March 16.  He has had exertional chest pain while  playing golf.  He was initially evaluated at [facility] where his  EKG was unremarkable, and his CT scan showed no evidence of a dissection.  His cardiac enzymes were positive with a CPK-MB of 2.5, troponin of 0.2.  The patient initially thought his chest and shoulder pain was due to his old  problems with arthritis, and he delayed presentation for that reason.  His  risk factors include hypertension, positive smoking, and positive family  history.  He underwent a cardiac catheterization by Dr. Rex Kras which  demonstrated a small LAD diagonal system with a 90% stenosis and a fairly  small circumflex system with an approximal 80% stenosis and a mid 50%  stenosis.  The right coronary was large and ectatic with a 40-50% stenosis.  His ejection fraction was normal, and he had no evidence of aortic of mitral  valve disease.  Based on his coronary anatomy and small size coronaries, he  is not felt to be a candidate for percutaneous intervention, and a surgical  evaluation was requested.   PAST MEDICAL  HISTORY:  1.  Actively smoking 1 pack-per-day for 40 years.  2.  Hypertension.  3.  BPH.  4.  Neuropathy of the lower extremities.  5.  Status post left shoulder surgery, tonsillectomy.  6.  No known drug allergies.  7.  Unstable angina with three-vessel disease.   HOME MEDICATIONS:  Doxycycline, HCTZ, nitroglycerin, Protonix, Neurontin,  Imdur.   ALLERGIES:  None, however ANTIHISTAMINES give him voiding difficulties.   SOCIAL HISTORY:  He is a retired Product/process development scientist.  He smokes 1 pack of  cigarettes a day.  He lives with his wife and does not use alcohol.   FAMILY HISTORY:  Positive for coronary disease.   REVIEW OF SYSTEMS:  CONSTITUTIONAL REVIEW:  Negative for fever or weight  loss.  ENT:  Negative for active dental problems.  He has had some GERD, but  his barium swallow study shows no obstruction or stricture.  PULMONARY:  Positive for his active smoking without history of an abnormal chest x-ray  with nodular mass.  He denies hemoptysis.  GI:  Positive for GERD, negative  for blood per  rectum.  UROLOGIC:  Positive for BPH and nocturia, negative  for kidney stones.  VASCULAR:  Negative for DVT or claudication.  MUSCULOSKELETAL:  Negative for fracture of the lower extremities.  NEUROLOGIC:  Negative for stroke, seizure, or syncope.   PHYSICAL EXAMINATION:  VITAL SIGNS:  The patient is 5 feet 9 inches and  weighs 190 pounds.  Blood pressure 130/70, pulse 80 and regular.  Respirations 18, temperature 99.0.  GENERAL APPEARANCE:  An elderly male, who appears to have chronic lung  disease but no distress.  HEENT:  Normocephalic, full EOMs.  Pharynx is clear.  NECK:  Without JVD, mass, or carotid bruit.  LYMPHATICS:  No palpable supraclavicular or axillary adenopathy.  LUNGS:  Scattered rhonchi, no wheezing.  CARDIAC:  Regular rhythm without S3 gallop or murmur.  ABDOMEN:  Obese without pulsatile mass or focal tenderness.  EXTREMITIES:  2+ pulses.  No edema or cyanosis.   NEUROLOGIC:  Alert and oriented without focal deficit.   LABORATORY DATA:  I have reviewed the coronary arteriograms, and he has  fairly tight proximal disease of the LAD, diagonal, and circumflex vessels.  His right coronary has a 40-50% lesion in the mid posterior descending.   IMPRESSION AND PLAN:  The patient has unstable angina with significant 2-3  vessel coronary disease.  He is not a candidate for percutaneous  intervention.  Surgical coronary vascularization would be his best long-term  option for relief of symptoms and preservation of LV function.  Prior to  surgery, we will start pulmonary toilet with bronchodilators and  expectorants to improve his pulmonary function.  His surgery will be  scheduled next week at the first available operating time.  This was  explained in detail with the patient and all the questions addressed.  Thank  you for the consultation.      PV/MEDQ  D:  04/19/2004  T:  04/19/2004  Job:  [id number]

## 2010-06-21 NOTE — H&P (Signed)
[name], [name]               ACCOUNT NO.:  [id number]   MEDICAL RECORD NO.:  [id number]          PATIENT TYPE:  INP   LOCATION:  A327                          FACILITY:  [facility]   PHYSICIAN:  Sherrilee Gilles. Gerarda Fraction, MD  DATE OF BIRTH:  Apr 22, 1933   DATE OF ADMISSION:  06/14/2005  DATE OF DISCHARGE:  LH                                HISTORY & PHYSICAL   CHIEF COMPLAINT:  Intractable diarrhea with abdominal pain.   HISTORY OF PRESENT ILLNESS:  The patient began with abdominal pain,  cramping, nausea and profuse explosive watery diarrhea, too numerous to  count.  He had no hematemesis, hematochezia or melena, but he did have a  stool approximately every one hour all evening long.  This was a gradual  onset.  Severity severe.  He was started on Cipro as an outpatient prior to  the onset of this diarrhea.   PAST MEDICAL HISTORY:  1.  Coronary artery disease.  2.  Gastroesophageal reflux disease.  3.  Abdominal hernia.   SOCIAL HISTORY:  A non-smoker, non-drinker.  No alcohol or drug use.   FAMILY HISTORY:  Noncontributory for this illness.   REVIEW OF SYSTEMS:  As under the HPI.   PHYSICAL EXAMINATION:  GENERAL:  Awake, alert and cooperative.  HEENT/NECK:  No jugular venous distention or adenopathy.  Neck is supple.  CHEST:  Clear.  HEART:  No murmur, gallop or rub.  ABDOMEN:  Soft, no organomegaly or masses.  Positive hernia noted without  incarceration.  EXTREMITIES:  No clubbing, cyanosis or edema.  NEUROLOGIC:  Nonfocal.   IMPRESSION:  Probable gastroenteritis, rule out Clostridium difficile  enterocolitis, although very early on antibiotics for this to appear.   PLAN:  Will give him IV fluids, hydration.  One of his x-rays was reported  to me as showing marked air fluid levels, possible ileus.  Therefore we will  get a CT scan of his abdomen, to make sure that there is nothing else going  on.  Supportive care.  Intervene as needed.      Sherrilee Gilles. Gerarda Fraction, MD  Electronically Signed     LJF/MEDQ  D:  06/15/2005  T:  06/15/2005  Job:  [id number]

## 2010-06-21 NOTE — Discharge Summary (Signed)
[name], [name] NO.:  [id number]   MEDICAL RECORD NO.:  [id number]          PATIENT TYPE:  INP   LOCATION:  2023                         FACILITY:  [facility]   PHYSICIAN:  Ivin Poot, M.D.  DATE OF BIRTH:  02/12/1933   DATE OF ADMISSION:  04/18/2004  DATE OF DISCHARGE:  04/30/2004                                 DISCHARGE SUMMARY   PRIMARY ADMISSION DIAGNOSIS:  Chest pain.   DISCHARGE DIAGNOSES:  1.  Coronary artery disease.  2.  Unstable angina.  3.  Status post subendocardial myocardial infarction.  4.  History of tobacco abuse.  5.  Hypertension.  6.  Benign prostatic hypertrophy.  7.  Peripheral neuropathy.  8.  History of esophageal spasm.   PROCEDURES PERFORMED:  1.  Cardiac catheterization.  2.  Coronary artery bypass graft surgery x3 (left internal mammary artery to      the diagonal, saphenous vein graft to the ramus intermedius, saphenous      vein graft to the posterior descending).  3.  Endoscopic vein harvest, right thigh.   HISTORY OF PRESENT ILLNESS:  The patient is a 77 year old male with a  history of hypertension and tobacco abuse.  On the date of this admission he  presented to the emergency room at [facility], complaining of an  intermittent bilateral upper arm pain, which had been occurring on and off  for several weeks.  The pain usually resolved within 15-20 minutes, and is  unrelated to activity.  On evaluation at [facility], his  electrocardiogram was unremarkable, and his CT scan showed no evidence of an  aortic dissection.  His cardiac enzymes, however, were positive with a CK-MB  of 2.5 and a troponin of 0.2.  Because of these findings, he was transferred  to [facility] for a cardiac catheterization and for  further treatment.   HOSPITAL COURSE:  He was admitted by cardiology and underwent a cardiac  catheterization which showed a small LAD diagonal system with a 90% stenosis  and a  fairly small circumflex system with an approximately 80% stenosis and  a mid-50% stenosis.  The right coronary artery was large and ectatic with a  40%-50% stenosis.  His ejection fraction was normal, and he had no evidence  of aortic or mitral valve disease.  Because of his small coronaries, he was  not felt to be a good candidate for a percutaneous intervention.  He was  seen in consultation by Dr. Tharon Aquas Trigt, for the consideration of bypass  surgery.  After a review of his arteriogram and examination of the patient,  Dr. Prescott Gum agreed that his best course of action would be to proceed with  a surgical revascularization at this time.  He explained the risks, benefits  and alternatives to the patient, and the patient agreed to proceed.  He was  taken to the operating room on April 25, 2004, and underwent a coronary  artery bypass graft surgery x3, as described in detail above.  He tolerated  the procedure well and was transferred to the  SICU in stable condition.  He  was able to be extubated shortly after surgery.  He was hemodynamically  stable and doing well on postoperative day one.  Overall he has done very  well postoperatively.  His  only postoperative difficulty really has been  some abdominal distention and nausea, which was felt to be related to his  pain medication.  He did undergo an abdominal x-ray series, and this showed  no evidence of obstruction.  He also had no evidence of significant ileus.  Since that time his pain medications have been adjusted, and he is not on  any narcotics at this time.  His diet has been advanced as tolerated.  He  has had normal return of his bowel function.  From a cardiac standpoint, he  has remained stable.  He has been started on a beta blocker, and the dose  has been titrated upward, based on his blood pressure and heart rate.  He is  tolerating this well and is remaining in sinus rhythm.  He has otherwise  been afebrile and his vital  signs have been stable.  He has been diuresed  back down to just below his preoperative weight, and shows no evidence of  clinical volume overload.  The surgical incision sites are all healing well.  He is ambulating in the halls without difficulty.  He has been weaned off of  supplemental oxygen, and is maintaining O2 saturations of greater than 90%  on room air.   PRESENT LABORATORY VALUES:  Hemoglobin 10, hematocrit 28.1, platelets 131,  white count 4.7.  Potassium 4.2, BUN 17, creatinine 1.2.   His last chest x-ray showed no evidence of pneumothorax with very mild lower  lobe atelectasis.   DISPOSITION:  It was felt that if he continues to remain stable over the  next 24 hours, and his GI function remains stable, he will hopefully be  ready for discharge home on April 30, 2004.   DISCHARGE MEDICATIONS:  1.  Enteric-coated aspirin 325 mg daily.  2.  Lopressor 50 mg t.i.d.  3.  Lipitor 20 mg daily.  4.  Nicotine patch 21 mg daily.  5.  Neurontin 300 mg t.i.d.  6.  Protonix 40 mg daily.  7.  Ultram 50 mg to 100 mg q.4-6h. p.r.n. pain.   DISCHARGE INSTRUCTIONS:  1.  He is asked to refrain from driving, heavy lifting, or strenuous      activity.  2.  He may continue ambulating daily and using his incentive spirometer.  3.  He may shower daily and clean his incisions with soap and water.   DISCHARGE FOLLOWUP:  He is asked to call and schedule an appointment with  Dr. Delrae Alfred B. Little in two weeks.  1.  He will then see Dr. Prescott Gum the following week, and our office will      call to arrange this appointment.  2.  He will have a chest x-ray at [facility] one hour      prior to his appointment with Dr. Prescott Gum, and should bring his films      to the [facility] Office.  He will call our office in the interim if he      experiences any problems or has questions.      GC/MEDQ  D:  04/29/2004  T:  04/29/2004  Job:  [id number]   cc:   Sherrilee Gilles. Gerarda Fraction, MD [address] [place] [address]  Fax: [phone]  Jeanella Craze. Little, M.D.  [address]  [place] [address]  Fax: [phone]

## 2010-06-21 NOTE — Consult Note (Signed)
[name], [name]               ACCOUNT NO.:  [id number]   MEDICAL RECORD NO.:  [id number]          PATIENT TYPE:  OUT   LOCATION:  RAD                           FACILITY:  [facility]   PHYSICIAN:  [name], M.D. DATE OF BIRTH:  1933/12/19   DATE OF CONSULTATION:  04/19/2004  DATE OF DISCHARGE:                                   CONSULTATION   REFERRING PHYSICIAN:  Jeanella Craze. [name], M.D.   REASON FOR CONSULTATION:  Dr. Delrae Alfred B. [name], covering for [name], asked [place] to see this 77 year old gentleman because of  dysphagia.   HISTORY OF PRESENT ILLNESS:  [name] was admitted to the hospital  yesterday with chest pain that turned out to be due to unstable angina with  a subendocardial MI (positive enzymes) and a catheterization showing two-  vessel coronary disease for which bypass surgery is planned next week. The  patient is currently on heparin.   With that background, we were asked to see the patient because of dysphagia  symptoms and initially was even thought that perhaps his chest pain was of  esophageal origin.   The patient has had longstanding dysphagia symptoms, for which he has been  treated by [name] in [place]. In 1999, he underwent  esophageal dilatation to 56-French with marked improvement in his dysphagia  symptoms apparently for several years, and then he represented with  dysphagia in 2003 and underwent a subsequent dilatation, apparently again  with good fairly long-lasting control of symptoms. He is maintained on  chronic PPI therapy (Protonix).   For the past eight months or so, however, he has had recurrent dysphagia  symptoms occurring on a frequent, almost daily basis. The dysphagia can  occur with liquids or solids and consists of transient hang up of the bolus  in the lower esophagus with associated epigastric pain. The spells are  brief, lasting less than a minute, and are typically terminated by taking  a  few additional swallows of a liquid to flush the bolus through. He has never  had a prolonged food impaction nor vomiting or regurgitation nor has he  required self-induced regurgitation to clear the bolus.   Today, we obtained a barium swallow with a tablet. This showed free passage  of barium tablet but some of the films revealed a corkscrew pattern to the  esophagus (not called on the official interpretation) suggesting the  presence of esophageal spasm.   ALLERGY:  ANTIHISTAMINE.   CURRENT MEDICATIONS:  HCTZ, nitroglycerin, Protonix, gabapentin, isosorbide  mononitrate, and doxazosin.   PAST SURGICAL HISTORY:  Abdominal surgery to my knowledge.   PAST MEDICAL HISTORY:  1.  Coronary disease with current subendocardial MI and unstable angina as      described above.  2.  History of hypertension and neuropathy.   HABITS:  Smokes one pack per day, nondrinker.   FAMILY HISTORY:  His father apparently had reflux symptoms and dysphagia.   SOCIAL HISTORY:  Retired, former [name] who lives with his wife.   REVIEW OF SYSTEMS:  The patient is apparently  up-to-date on colon cancer  screening, having had several colonoscopies by Dr. Bridgette [name] and  apparently he has been released for some time before he needs another   PHYSICAL EXAMINATION:  GENERAL:  A pleasant overweight white male in no  evident distress.  CHEST: Clear.  HEART: Normal.  ABDOMEN:  Abdomen has what appears to be a small ventral hernia in the  midabdominal area (no associated abdominal incision), versus a mild case of  diastasis recti. No overt abdominal tenderness.   LABORATORY DATA:  Mild elevation of liver chemistries (AST 67, ALT 71 with  bilirubin and alkaline phosphate normal), albumin normal at 3.5. Hemoglobin  13.1 with an MCV of 100, platelets 130,000.   IMPRESSION:  1.  Clinical and radiographic evidence of esophageal spasm. No evidence of      esophageal stricture.  2.  Mild elevation  of liver chemistries, hepatocellular pattern, presumably      due to fatty infiltration of the liver.   RECOMMENDATIONS:  1.  Since the patient's symptoms are not disabling, and since he is awaiting      bypass surgery with unstable angina, I would favor expectant management      for now, with consideration for elective dilatation of the esophagus by      his primary gastroenterologist ([name]) when he has      recovered from was CABG surgery.  2.  Concerning elevated liver chemistries, these could be followed as an      outpatient. It may be that these are stable for the patient and/or that      he has had imaging studies of the liver such as an ultrasound which      might      demonstrate fatty infiltration. If not, consideration might be given to      serologic workup including hepatitis serologies iron studies, etc.   I appreciate the opportunity of seeing this patient in consultation with  you. signed [name] end of dictation      RB/MEDQ  D:  04/19/2004  T:  04/20/2004  Job:  [id number]   cc:   [name], M.D.  Fax: [phone]   [name], M.D.  [address]  [place]  [place] [address]   Shane J. Gerarda Fraction, MD  [address] [place] [address]  Fax: [phone]

## 2010-06-21 NOTE — Consult Note (Signed)
[name], [name]               ACCOUNT NO.:  [id number]   MEDICAL RECORD NO.:  [id number]          PATIENT TYPE:  INP   LOCATION:  A327                          FACILITY:  [facility]   PHYSICIAN:  Gaston Islam. Neijstrom, MD  DATE OF BIRTH:  1933/07/09   DATE OF CONSULTATION:  06/16/2005  DATE OF DISCHARGE:                                   CONSULTATION   ADMITTING DIAGNOSIS:  1.  Acute diarrheal disorder with dehydration, abdominal pain, cramping,      nausea.  2.  History of coronary artery disease status post coronary artery bypass      grafting March of 2006 by Dr. Tharon Aquas Trigt.  3.  Gastroesophageal reflux disease.  4.  History of steatohepatitis in the past for which he has seen Dr. Gala Romney      and Dr. Laural Golden according to the nurse practitioner who has seen him this      morning.  5.  Obesity.  6.  Peripheral neuropathy for which he was diagnosed by Dr. Katherina Right      approximately six years ago and he has been treated with gabapentin 300      mg t.i.d. since.  7.  Hypertension.  8.  Benign prostatic hypertrophy (sees Dr. Lawerance Bach and is on doxazosin).  9.  Left rotator cuff repair several years ago.   HISTORY:  This is a pleasant 77 year old gentleman who is going to be 58  tomorrow who was admitted the other day with explosive diarrheal disorder.  He had been feeling well until Friday when he was playing golf and felt  lightheaded, weak, felt like he had to have bowel movement.  He went to his  clubhouse, had fairly significant stool, but became more and more loose as  the day went on.  Friday night he was having just watery diarrheal stools  throughout the night.  He was very weak and dehydrated.  By Saturday was  very, very weak.  Called his [employer] referral and nurse who said he should go  to the hospital.   He was admitted here by Dr. Gerarda Fraction and his group, found to be afebrile but  his pulse was 82, respirations 20, blood pressure was 157/83 and those vital  signs have not  changed very much since then.   He was also found to have thrombocytopenia which indeed in retrospect is not  necessarily new.  It was 138,000 on admission and 124,000 yesterday.  His  hemoglobin was 13 on admission, dropped down to 11.5 with rehydration.  His  white count was normal.  Differential was unremarkable.   His BUN was 20 on admission, creatinine 1.4.  Today his creatinine is 1.2  with a BUN of 13.   His liver enzymes showed his SGOT and SGPT to be mildly elevated at 79 and  54, respectively.   Total bilirubin was 2.6 on admission, was 1.8 today.   Urinalysis was unremarkable.  He had CT of the abdomen and pelvis which in  retrospect clearly does show splenomegaly.  He has no distinct focal hepatic  lesions but  has diffuse fatty infiltration of his liver.  CT of the pelvis  showed an enlarged prostate gland and some mild diverticular disease, but  nothing specific.   He had a chest x-ray which along with his flat plate and upright showed no  acute cardiopulmonary findings.   In retrospect, his laboratories show that he had thrombocytopenia even at  the time of his heart surgery in March of 2006 as well as a repeat in March  of 2006.  His platelets in June of 2006 were in the normal range of 165,000.   Even prior to surgery, however, his platelets were low at 136,000 last year.   He has not had any bleeding problems that he has ever been aware of.  He has  no family history of a blood disorder that he is aware of.   All of his family members died of heart disease including his parents,  essentially.   He has four brothers, one of whom also has cardiac disease as well.   He and his wife have been married many years.  He is a retired former  Counsellor.  His wife is in good health for the most part.  He and his  wife had one daughter who died at age 20 many years ago in a car wreck.  They had no other children.   He has no known allergies.   He does drink  alcohol, perhaps one to four mixed drinks per night and may  have used as much as a pint of alcohol per week in years past.   PHYSICAL EXAMINATION:  GENERAL:  He is afebrile.  Skin is warm and dry to  the touch.  He has no obvious lymphadenopathy.  VITAL SIGNS:  Blood pressure is 150/70, respirations 16-18 and unlabored at  rest.  He certainly is overweight for his height.  LYMPH NODES:  His lymph node examination, however, is negative throughout.  LUNGS:  Clear at this time.  He was a former smoker __________ one to two  packs a day for many years.  HEART:  Regular rhythm and rate without murmur, rub, or gallop.  He has a  mid sternal incision which has healed well.  ABDOMEN:  Little fullness left upper quadrant but I can feel his liver edge,  but overall span is about 12 cm by percussion, palpation.  He has normal  bowel sounds.  EXTREMITIES:  He has no peripheral edema of his arms or legs at this time.  Pulses in his feet are 1-2+ and symmetrical.  HEENT:  He has partial upper and lower plates.  Tongue is unremarkable.  Pupils are small, nonreactive to light at this time.  He is alert and very  oriented.  There are no petechiae, no ecchymoses.   This gentleman certainly has several reasons possibly for thrombocytopenia.   He certainly only has what appears to be liver damage from fatty  infiltration and I think he has secondary splenomegaly probably causing  sequestration.   This acute viral illness which is what this appears to be so far may also be  causing his platelets to decline in the the immediate phase of this disease  but lastly he has been taking HCTZ as part of his therapy for a p.r.n. edema  and I think that is a drug that might be causing some of this potentially as  well.  He has been on HCTZ for quite some time so I would like to stop the  HCTZ, asked him to stay away from alcohol completely which might be exacerbating his thrombocytopenia, see him back in six  weeks.  If things  have not changed I think we have to blame this process primarily on  sequestration from secondary splenomegaly from his primary liver disease.   Dr. Gerarda Fraction has already ordered a P11 level, folic acid level, and serum iron,  TIBC, and ferritin.  I totally agree with those laboratories and will see if  they show [place] anything.  Thus far he has no stigmata suggesting E16 or folic  acid deficiency.  At this time the iron will be interesting because he has  had surgery and he states he was not given any blood transfusions after his  cardiac bypass surgery.      Gaston Islam. Tressie Stalker, MD  Electronically Signed     ESN/MEDQ  D:  06/16/2005  T:  06/17/2005  Job:  [id number]

## 2010-06-21 NOTE — H&P (Signed)
NAME:  COMPTON, BRIGANCE NO.:  [id number]   MEDICAL RECORD NO.:  [id number]          PATIENT TYPE:  EMS   LOCATION:  MAJO                         FACILITY:  [facility]   PHYSICIAN:  Broadus John, MD DATE OF BIRTH:  07-12-1933   DATE OF ADMISSION:  04/18/2004  DATE OF DISCHARGE:                                HISTORY & PHYSICAL   HISTORY OF PRESENT ILLNESS:  [name] is a 77 year old white male who  is admitted to [facility] for suspected unstable angina.   The patient, who has no past history of cardiac disease, presented to the  emergency department at [facility] with a several week history of  bilateral upper arm pain.  This has been intermittent in nature, occurring  both during activity such as walking and also during eating.  The discomfort  is described as an ache.  It usually resolves within 15-20 minutes.  It  appears to be unrelated to respirations or other activities.  He does not  have to discontinue the activity when he experiences this discomfort.  There  is no associated dyspnea, diaphoresis or nausea.  There were no exacerbating  or ameliorating factors.  He has been undergoing evaluation recently for  epigastric discomfort which, at times, seems to be related to the upper arm  discomfort at times not, but the level of discomfort in his arm today  appeared was out of proportion to what he has experienced.  He has  experienced perhaps three episodes of upper arm pain daily.  He reports no  chest pain, tightness, heaviness, pressure or squeezing.   As noted, the patient has no past history of cardiac disease, including no  history of chest pain, myocardial infarction, coronary artery disease,  congestive heart failure or arrhythmia.  He was placed on Isosorbide  mononitrate and nitroglycerin by his primary care physician yesterday.  However, he has not taken any nitroglycerin yet.  He has a number of risk  factors for coronary  artery disease including hypertension, smoking (one  pack per day) and both parents and numerous siblings who suffered heart  attacks, some at young and some at advanced ages.  There was no history of  diabetes mellitus.  There was no history of dyslipidemia.   The patient's other medical problems including prostate enlargement and  neuropathy.   MEDICATIONS:  1.  Doxycin.  2.  Hydrochlorothiazide.  3.  Nitroglycerin.  4.  Protonix.  5.  Gabapentin.  6.  Isosorbide mononitrate.   ALLERGIES:  He reports that he is allergic to some unknown antihistamine.   FAMILY HISTORY:  As described above.   SIGNIFICANT INJURIES:  None.   PREVIOUS OPERATIONS:  None.   SOCIAL HISTORY:  The patient is retired.  He lives with his wife.  He does  not drink.  He smokes one pack of cigarettes per day.   REVIEW OF SYSTEMS:  No problems related to his head, eyes, ears, nose,  mouth, throat, lungs, gastrointestinal system, genitourinary system or  extremities.  There is no history of neurological or psychiatric disorder.  There  is no history of fever, chills or weight loss.   PHYSICAL EXAMINATION:  VITAL SIGNS:  Blood pressure 136/74, pulse 80 and  regular, respirations 18, temperature 98.1.  The patient was an obese, older  white male in no discomfort.  He was alert, oriented, appropriate and  responsive.  HEENT:  Normal.  NECK:  Without thyromegaly or adenopathy.  Carotid pulses were palpable  bilaterally and without bruits.  CARDIAC:  Normal S1, S2.  There was no S3, S4, murmur, rub or click.  Cardiac rhythm was regular.  No chest wall tenderness was noted.  LUNGS:  Clear.  ABDOMEN:  Soft and nontender.  There was no mass, hepatosplenomegaly, bruit,  distention, rebound, guarding, or rigidity.  Bowel sounds were normal.  RECTAL/GENITAL:  No performed as they were not pertinent for the reason for  acute care hospitalization.  EXTREMITIES:  Without edema, deviation or deformity.  Radial and  dorsalis  pedis pulses were palpable bilaterally.  Prescreening neurological survey  was unremarkable.   LABORATORY DATA:  The electrocardiogram was normal.  Chest radiograph was  unremarkable.  Initial set of cardiac markers revealed a myoglobin of 54.3,  CK MB 2.5, troponin 0.10.  White count was 8.3 with a hemoglobin of 14.3 and  hematocrit of 40.0.  Potassium 3.9, BUN 22, creatinine 1.2.  The abdominal  and pelvic CT scans from [facility], according to the radiologist,  demonstrated no evidence of acute cardiovascular disease.  The full reports  are pending.  The remaining studies were pending at the time of this  dictation.   IMPRESSION:  1.  Unstable angina.  2.  Hypertension.  3.  Prostate enlargement.  4.  Neuropathy.   PLAN:  1.  Telemetry.  2.  Serial cardiac enzymes.  3.  Aspirin for intravenous heparin.  4.  Intravenous nitroglycerin.  5.  Metoprolol.  6.  Fasting lipid profile.  7.  Further measures per Dr. Gwenlyn Found.      MSC/MEDQ  D:  04/18/2004  T:  04/18/2004  Job:  [id number]   cc:   Quay Burow, M.D.  Fax: [phone]

## 2010-11-04 LAB — POCT CARDIAC MARKERS
CKMB, poc: 1.1
CKMB, poc: <1 — ABNORMAL LOW
Myoglobin, poc: 84.4
Troponin i, poc: <0.05

## 2010-11-04 LAB — CBC
HCT: 36.4 — ABNORMAL LOW
HCT: 36.2 — ABNORMAL LOW
MCHC: 34.1
MCV: 102.2 — ABNORMAL HIGH
MCV: 102.2 — ABNORMAL HIGH
MCV: 100.5 — ABNORMAL HIGH
Platelets: 114 — ABNORMAL LOW
RBC: 3.47 — ABNORMAL LOW
RBC: 3.6 — ABNORMAL LOW
RDW: 12.8
WBC: 4.6
WBC: 3.8 — ABNORMAL LOW

## 2010-11-04 LAB — CARDIAC PANEL(CRET KIN+CKTOT+MB+TROPI)
CK, MB: 1
Troponin I: 0.01

## 2010-11-04 LAB — COMPREHENSIVE METABOLIC PANEL
BUN: 16
CO2: 24
Chloride: 102
Creatinine, Ser: 1.27
GFR calc non Af Amer: 56 — ABNORMAL LOW
Total Bilirubin: 2.2 — ABNORMAL HIGH

## 2010-11-04 LAB — LIPID PANEL
LDL Cholesterol: 54
Triglycerides: 42

## 2010-11-04 LAB — DIFFERENTIAL
Eosinophils Absolute: 0.1
Eosinophils Relative: 3
Lymphs Abs: 0.9
Monocytes Relative: 10
Neutrophils Relative %: 63

## 2010-11-04 LAB — BASIC METABOLIC PANEL
Chloride: 104
GFR calc Af Amer: >60
Potassium: 3.8

## 2011-02-07 DIAGNOSIS — R0989 Other specified symptoms and signs involving the circulatory and respiratory systems: Secondary | ICD-10-CM | POA: Diagnosis not present

## 2011-06-19 DIAGNOSIS — R269 Unspecified abnormalities of gait and mobility: Secondary | ICD-10-CM | POA: Diagnosis not present

## 2011-06-19 DIAGNOSIS — M9420 Chondromalacia, unspecified site: Secondary | ICD-10-CM | POA: Diagnosis not present

## 2011-08-13 DIAGNOSIS — Z79899 Other long term (current) drug therapy: Secondary | ICD-10-CM | POA: Diagnosis not present

## 2011-08-13 DIAGNOSIS — D649 Anemia, unspecified: Secondary | ICD-10-CM | POA: Diagnosis not present

## 2011-08-13 DIAGNOSIS — R42 Dizziness and giddiness: Secondary | ICD-10-CM | POA: Diagnosis not present

## 2011-08-26 ENCOUNTER — Encounter (HOSPITAL_COMMUNITY): Payer: Self-pay | Admitting: Oncology

## 2011-08-26 ENCOUNTER — Encounter (HOSPITAL_COMMUNITY): Payer: Medicare Other | Attending: Oncology | Admitting: Oncology

## 2011-08-26 VITALS — BP 158/89 | HR 58 | Temp 98.3°F | Ht 68.5 in | Wt 198.0 lb

## 2011-08-26 DIAGNOSIS — Z951 Presence of aortocoronary bypass graft: Secondary | ICD-10-CM | POA: Diagnosis not present

## 2011-08-26 DIAGNOSIS — D649 Anemia, unspecified: Secondary | ICD-10-CM | POA: Diagnosis not present

## 2011-08-26 DIAGNOSIS — D696 Thrombocytopenia, unspecified: Secondary | ICD-10-CM | POA: Diagnosis not present

## 2011-08-26 DIAGNOSIS — I10 Essential (primary) hypertension: Secondary | ICD-10-CM | POA: Diagnosis not present

## 2011-08-26 LAB — CBC WITH DIFFERENTIAL/PLATELET
Basophils Absolute: 0 10*3/uL (ref 0.0–0.1)
Basophils Relative: 0 % (ref 0–1)
Hemoglobin: 12.5 g/dL — ABNORMAL LOW (ref 13.0–17.0)
MCHC: 35.1 g/dL (ref 30.0–36.0)
Monocytes Relative: 8 % (ref 3–12)
Neutro Abs: 3 10*3/uL (ref 1.7–7.7)
Neutrophils Relative %: 61 % (ref 43–77)
Platelets: 136 10*3/uL — ABNORMAL LOW (ref 150–400)

## 2011-08-26 LAB — RETICULOCYTES
RBC.: 3.64 MIL/uL — ABNORMAL LOW (ref 4.22–5.81)
Retic Count, Absolute: 83.7 10*3/uL (ref 19.0–186.0)
Retic Ct Pct: 2.3 % (ref 0.4–3.1)

## 2011-08-26 LAB — COMPREHENSIVE METABOLIC PANEL
AST: 42 U/L — ABNORMAL HIGH (ref 0–37)
Albumin: 4.2 g/dL (ref 3.5–5.2)
BUN: 15 mg/dL (ref 6–23)
Calcium: 9.6 mg/dL (ref 8.4–10.5)
Creatinine, Ser: 1.24 mg/dL (ref 0.50–1.35)
Total Protein: 7.3 g/dL (ref 6.0–8.3)

## 2011-08-26 LAB — GAMMA GT: GGT: 107 U/L — ABNORMAL HIGH (ref 7–51)

## 2011-08-26 NOTE — Progress Notes (Signed)
[name] presented for labwork. Labs per MD order drawn via Peripheral Line 25 gauge needle inserted in lt forearm.  Good blood return present. Procedure without incident.  Needle removed intact. Patient tolerated procedure well.

## 2011-08-26 NOTE — Patient Instructions (Signed)
[facility]  Discharge Instructions [name]  [id number] September 28, 1933 Dr. Everardo All  RECOMMENDATIONS MADE BY THE CONSULTANT AND ANY TEST RESULTS WILL BE SENT TO YOUR REFERRING DOCTOR.   EXAM FINDINGS BY MD TODAY AND SIGNS AND SYMPTOMS TO REPORT TO CLINIC OR PRIMARY MD:  We need to do some lab work and ct scans of your abdomen to look at your spleen, and liver.   INSTRUCTIONS GIVEN AND DISCUSSED: We don't think this is leukemia based on past labs that you have had and your history.  SPECIAL INSTRUCTIONS/FOLLOW-UP: We will see you back next year to go over tests   I acknowledge that I have been informed and understand all the instructions given to me and received a copy. I do not have any more questions at this time, but understand that I may call the Specialty Clinic at [facility] at [phone] during business hours should I have any further questions or need assistance in obtaining follow-up care.    __________________________________________  _____________  __________ Signature of Patient or Authorized Representative            Date                   Time    __________________________________________ Nurse's Signature

## 2011-08-26 NOTE — Progress Notes (Signed)
Problem #1 thrombocytopenia dating back to at least 2011 when his platelets were 129,000. At that time his hemoglobin was 12.7 g also low. White count was normal at that time. Problem #2 anemia rarely macrocytic but usually normocytic but his MCV was 102.3 on 04/16/2010  Problem #3 CABG 8 years ago by Dr. Prescott Gum he still takes one aspirin 3-4 times per week  Problem #4 BPH on Cardura XL8 milligrams daily  Problem #5 peripheral neuropathy on gabapentin  Problem #6 hypertension on HCTZ amlodipine lisinopril and metoprolol  Problem #7 long-standing smoking history though he quit at the time of his heart surgery  Problem #8 orthostatic hypotension intermittently.  This is a very pleasant gentleman who is 78 years old and tells me that he has been told that his platelets were low for more than 5-10 years. He said that a physician at the [place] several years ago told him not to worry about them. He does not bleed from his GU GI or and nose or gums. He has not lost weight has no fevers no chills no weight loss. His appetite is good he is not aware any lumps anywhere. He has had 2 or 3 benign skin lesions removed by Dr. Denna Haggard several years ago. He has no change in bowel habits passes urine fine with the Cardura which is taken for several years. He sees Dr. Rosana Hoes in urology for followup of his BPH.  None of these drugs are recent additions that he can remember.  He is accompanied by his wife. They had one daughter who was killed in an auto accident at age 62 and they have no other children. He was one a 6 boys and all who made it to adulthood have had heart disease. He had one brother who died at 18 months of age from pneumonia.  Vital signs are recorded he weighs 198 pounds and is 5 feet 8/2 inches tall with a body surface area 2.08 m. His BMI is 29.7. Pulse is 60 and regular respirations 16-18 and unlabored at rest. Blood pressure is recorded he denies pain. He has a large ventral abdominal hernia. He  has chest scar from his CABG. Lungs are clear but with diminished breath sounds throughout. He has no thyromegaly. No adenopathy in any location. He has numerous seborrheic keratoses on his chest and back. He has upper and lower partial plate he has eye changes from prior cataract operations. His facial symmetry is intact. He has a protuberant abdomen with what I think is a palpable spleen in the right lateral decubitus position in the left upper quadrant on deep this of inspirations. There is no hepatomegaly. Bowel sounds are diminished. He has puffiness of his anterior tibial areas and 1+ pulses in his feet both posterior tibialis and dorsalis pedis pulses. He has pale nail color and palmar erythema.  This gentleman never drank heavily perhaps one or 2 cocktails on her very social basis only. He has been overweight for some time. He did serve in [employer] for 2 years weighing 150 pounds when he entered and 180 pounds when he was discharged. I think he has need of a workup for both anemia and thrombocytopenia and may need a bone marrow biopsy if we cannot come up with an explanation for the above findings. I will see him after her CAT scan and blood work that I ordered today

## 2011-08-27 LAB — IRON AND TIBC
Iron: 106 ug/dL (ref 42–135)
TIBC: 294 ug/dL (ref 215–435)
UIBC: 188 ug/dL (ref 125–400)

## 2011-08-27 LAB — FERRITIN: Ferritin: 213 ng/mL (ref 22–322)

## 2011-09-01 ENCOUNTER — Ambulatory Visit (HOSPITAL_COMMUNITY)
Admission: RE | Admit: 2011-09-01 | Discharge: 2011-09-01 | Disposition: A | Discharge status: Home / Self Care | Payer: Medicare Other | Source: Ambulatory Visit | Attending: Oncology | Admitting: Oncology

## 2011-09-01 DIAGNOSIS — K7689 Other specified diseases of liver: Secondary | ICD-10-CM | POA: Insufficient documentation

## 2011-09-01 DIAGNOSIS — D696 Thrombocytopenia, unspecified: Secondary | ICD-10-CM | POA: Diagnosis not present

## 2011-09-01 DIAGNOSIS — I722 Aneurysm of renal artery: Secondary | ICD-10-CM | POA: Diagnosis not present

## 2011-09-01 DIAGNOSIS — R161 Splenomegaly, not elsewhere classified: Secondary | ICD-10-CM | POA: Insufficient documentation

## 2011-09-01 MED ORDER — IOHEXOL 300 MG/ML  SOLN
100.0000 mL | Freq: Once | INTRAMUSCULAR | Status: AC | PRN
Start: 2011-09-01 — End: 2011-09-01
  Administered 2011-09-01: 100 mL via INTRAVENOUS

## 2011-09-02 ENCOUNTER — Encounter (HOSPITAL_BASED_OUTPATIENT_CLINIC_OR_DEPARTMENT_OTHER): Payer: Medicare Other | Admitting: Oncology

## 2011-09-02 VITALS — BP 165/60 | HR 58 | Temp 98.2°F | Wt 197.0 lb

## 2011-09-02 DIAGNOSIS — D696 Thrombocytopenia, unspecified: Secondary | ICD-10-CM

## 2011-09-02 DIAGNOSIS — K219 Gastro-esophageal reflux disease without esophagitis: Secondary | ICD-10-CM | POA: Diagnosis not present

## 2011-09-02 DIAGNOSIS — R161 Splenomegaly, not elsewhere classified: Secondary | ICD-10-CM

## 2011-09-02 DIAGNOSIS — D649 Anemia, unspecified: Secondary | ICD-10-CM | POA: Diagnosis not present

## 2011-09-02 NOTE — Progress Notes (Signed)
Problem #1 thrombocytopenia and minimal anemia. Problem #2 steatohepatitis and splenomegaly Problem #3 excessive weight with a BMI of 29.7  Problem #4 history of CABG Problem #5 hypertension Problem #6 GERD  The patient is here today with his wife to go over the results of his tests. He does not have any indication of vitamin deficiency etc. when he does have is mild splenomegaly and CLL hepatitis with elevation of his GGT SGOT and mild elevation of his bilirubin. It appears that I have seen this gentleman in the past and 2009 when he was hospitalized with an acute diarrheal disorder at which time he had the same findings. His spleen has not changed in size essentially.  I do think he needs to lose weight which might help retard the development of worsening liver disease. I want him a consultation once again with Dr. Sydell Axon who he has seen in the past. He does not want to pursue that presently. Therefore he will try to change his lifestyle and try to lose to 3 pounds of weight per month and aim for a weight of 165 to 170 pounds. I will see him back in 6-7 months with repeat blood count and liver enzymes if he is liver enzymes any worse I will send him back to Dr. Sydell Axon. Otherwise I do not think he is changed significantly from 2009. I do not think is an indication for bone marrow aspirate and biopsy.

## 2011-09-02 NOTE — Patient Instructions (Addendum)
[name]  [id number] Feb 16, 1933 Dr. Everardo All   [facility]  Discharge Instructions  RECOMMENDATIONS MADE BY THE CONSULTANT AND ANY TEST RESULTS WILL BE SENT TO YOUR REFERRING DOCTOR.   EXAM FINDINGS BY MD TODAY AND SIGNS AND SYMPTOMS TO REPORT TO CLINIC OR PRIMARY MD: MD feels that your problems with you blood counts are related to your fatty liver disease which is due to excessive weight.  This can affect your spleen which can in turn can hold onto your platelets.  Try to lose at 2 - 3 pounds each month.  If you want we can refer you to a gastroenterologist - just let us know.  MEDICATIONS PRESCRIBED: none   INSTRUCTIONS GIVEN AND DISCUSSED: Other :  Report unusual bruising or bleeding.  SPECIAL INSTRUCTIONS/FOLLOW-UP: Lab work Needed in 6 months and Return to Clinic for follow-up in 6 months after blood work.   I acknowledge that I have been informed and understand all the instructions given to me and received a copy. I do not have any more questions at this time, but understand that I may call the Specialty Clinic at [facility] at [phone] during business hours should I have any further questions or need assistance in obtaining follow-up care.    __________________________________________  _____________  __________ Signature of Patient or Authorized Representative            Date                   Time    __________________________________________ Nurse's Signature

## 2011-09-26 DIAGNOSIS — M545 Low back pain: Secondary | ICD-10-CM | POA: Diagnosis not present

## 2011-09-26 DIAGNOSIS — M5430 Sciatica, unspecified side: Secondary | ICD-10-CM | POA: Diagnosis not present

## 2011-09-26 DIAGNOSIS — Z6828 Body mass index (BMI) 28.0-28.9, adult: Secondary | ICD-10-CM | POA: Diagnosis not present

## 2011-10-28 DIAGNOSIS — M5137 Other intervertebral disc degeneration, lumbosacral region: Secondary | ICD-10-CM | POA: Diagnosis not present

## 2011-10-28 DIAGNOSIS — M25569 Pain in unspecified knee: Secondary | ICD-10-CM | POA: Diagnosis not present

## 2011-10-28 DIAGNOSIS — M25559 Pain in unspecified hip: Secondary | ICD-10-CM | POA: Diagnosis not present

## 2011-12-08 ENCOUNTER — Telehealth: Payer: Self-pay | Admitting: Internal Medicine

## 2011-12-08 NOTE — Telephone Encounter (Signed)
Pt agreed to come Friday at 9 to see RMR

## 2011-12-08 NOTE — Telephone Encounter (Signed)
Sere if we can put the patient on Dr. Roseanne Kaufman schedule for this Friday.

## 2011-12-08 NOTE — Telephone Encounter (Signed)
Patient called to make OV with RMR ONLY and refuses to see anyone else. He is having bad burning in his stomach after he eats and he is having bad gas. I offered OV with PA, but patient does not want to see anyone but RMR or he was going to find another doctor in [place]. I told him to let me see what RMR advises and I would call him back at [phone]

## 2011-12-12 ENCOUNTER — Ambulatory Visit (INDEPENDENT_AMBULATORY_CARE_PROVIDER_SITE_OTHER): Payer: Medicare Other | Admitting: Internal Medicine

## 2011-12-12 ENCOUNTER — Encounter: Payer: Self-pay | Admitting: Internal Medicine

## 2011-12-12 VITALS — BP 120/64 | HR 73 | Temp 98.2°F | Ht 68.5 in | Wt 198.8 lb

## 2011-12-12 DIAGNOSIS — R143 Flatulence: Secondary | ICD-10-CM | POA: Diagnosis not present

## 2011-12-12 DIAGNOSIS — R141 Gas pain: Secondary | ICD-10-CM

## 2011-12-12 DIAGNOSIS — R140 Abdominal distension (gaseous): Secondary | ICD-10-CM | POA: Insufficient documentation

## 2011-12-12 DIAGNOSIS — R142 Eructation: Secondary | ICD-10-CM | POA: Diagnosis not present

## 2011-12-12 NOTE — Patient Instructions (Addendum)
Return 1 stool sample for occult blood testing  Trial of Restora - probiotic daily to combat abdominal bloating.  Weight loss recommended. GERD information provided  Office followup appointment for 6 weeks

## 2011-12-12 NOTE — Progress Notes (Signed)
Primary Care Physician:  Glo Herring., MD Primary Gastroenterologist:  Dr. Gala Romney  Pre-Procedure History & Physical: HPI:  [name] is a 78 y.o. male here for several day history of abdominal bloating. History of GERD. History Schatzki's ring  - dilated. Was seen here last year for a acute diarrheal illness after consuming [place] food. Stool studies negative. The symptoms resolved. He stopped taking protonix recently bloating has improved considerably. Has 1-2 bowel movements daily - does not always feel he completely evacuates. Has trouble with the anorectal hygiene from time to time because of this problem. Last colonoscopy 2006 demonstrated diverticulosis. Has not been on regular fiber supplement. Follow Dr. Dr. Tressie Stalker for mild thrombocytopenia. Occasional EtOH-1-2 times weekly. No dysphagia. No melena no rectal bleeding. Family history GI neoplasia.  Mild splenomegaly and fatty liver on prior CT.  Past Medical History  Diagnosis Date  . CAD (coronary artery disease) of artery bypass graft 2006  . Neuropathy   . GERD (gastroesophageal reflux disease)   . HTN (hypertension)   . BPH (benign prostatic hyperplasia)   . Fatty liver   . Schatzki's ring     non critical    Past Surgical History  Procedure Date  . Coronary artery bypass graft 2006    placed stent for crossed coronary arteries  . Rotater cuff     repair  . Cataract extraction 2011  . Tonsillectomy   . Esophagogastroduodenoscopy 2006    Dr. Ruthell Rummage Schatzki's ring, s/p 58-F Maloney dilation  . Colonoscopy 2006    Dr. Margarito Courser pancolonic diverticula    Prior to Admission medications   Medication Sig Start Date End Date Taking? Authorizing Provider  ALPRAZolam Duanne Moron) 0.5 MG tablet Take 0.5 mg by mouth at bedtime as needed.     Yes Historical Provider, MD  amLODipine (NORVASC) 10 MG tablet Take 10 mg by mouth daily.   Yes Historical Provider, MD  aspirin 81 MG tablet Take 81 mg by mouth 2  (two) times a week.   Yes Historical Provider, MD  B Complex Vitamins (B COMPLEX PO) Take 1 mL by mouth daily.   Yes Historical Provider, MD  doxazosin (CARDURA XL) 8 MG 24 hr tablet Take 8 mg by mouth daily with breakfast.     Yes Historical Provider, MD  flunisolide (NASALIDE) 0.025 % SOLN Inhale 2 sprays into the lungs 2 (two) times daily.   Yes Historical Provider, MD  gabapentin (NEURONTIN) 300 MG capsule Take 300 mg by mouth 3 (three) times daily.     Yes Historical Provider, MD  hydrochlorothiazide 25 MG tablet Take 12.5 mg by mouth daily.     Yes Historical Provider, MD  lisinopril (PRINIVIL,ZESTRIL) 10 MG tablet Take 10 mg by mouth daily.     Yes Historical Provider, MD  loperamide (LOPERAMIDE A-D) 2 MG tablet Take 2 mg by mouth 4 (four) times daily as needed.     Yes Historical Provider, MD  metoprolol succinate (TOPROL-XL) 25 MG 24 hr tablet Take 12.5 mg by mouth daily.     Yes Historical Provider, MD  pantoprazole (PROTONIX) 40 MG tablet Take 40 mg by mouth daily.     Yes Historical Provider, MD    Allergies as of 12/12/2011 - Review Complete 12/12/2011  Allergen Reaction Noted  . Antihistamines, chlorpheniramine-type Anxiety 08/26/2011    Family History  Problem Relation Age of Onset  . Heart attack Mother 53    deceased  . Heart attack  80    deceased  .  Colon cancer Neg Hx     History   Social History  . Marital Status: Married    Spouse Name: N/A    Number of Children: N/A  . Years of Education: N/A   Occupational History  . Not on file.   Social History Main Topics  . Smoking status: Former Smoker -- 1.0 packs/day for 40 years    Types: Cigarettes    Quit date: 02/04/2003  . Smokeless tobacco: Never Used  . Alcohol Use: Yes     Comment: occassional drinker  . Drug Use: No  . Sexually Active: No   Other Topics Concern  . Not on file   Social History Narrative  . No narrative on file    Review of Systems: See HPI, otherwise negative  ROS  Physical Exam: BP 120/64  Pulse 73  Temp 98.2 F (36.8 C) (Temporal)  Ht 5' 8.5" (1.74 m)  Wt 198 lb 12.8 oz (90.175 kg)  BMI 29.79 kg/m2 General:   Alert,  Well-developed, well-nourished, pleasant and cooperative in NAD Skin:  Intact without significant lesions or rashes. Eyes:  Sclera clear, no icterus.   Conjunctiva pink. Ears:  Normal auditory acuity. Nose:  No deformity, discharge,  or lesions. Mouth:  No deformity or lesions. Neck:  Supple; no masses or thyromegaly. No significant cervical adenopathy. Lungs:  Clear throughout to auscultation.   No wheezes, crackles, or rhonchi. No acute distress. Heart:  Regular rate and rhythm; no murmurs, clicks, rubs,  or gallops. Abdomen: Obese. Positive bowel sounds. No succussion splash Non-distended, normal bowel sounds.  Soft and nontender without appreciable mass or hepatosplenomegaly.  Pulses:  Normal pulses noted. Extremities:  Without clubbing or edema.  Impression/Plan: 77 year old gentleman with recent nonspecific abdominal bloating symptoms now resolved with cessation of Protonix. This may be a coincidence. He does not look at all acutely ill; has a benign abdominal exam currently. Has some trouble with a emptying after having a bowel movement. He has known diverticulosis.  Recommendations: Weight loss. Antireflux diet. Trial of Crestor a probiotic. Benefiber 2 tablespoons daily. The patient is to return 1 stool sample for occult blood testing. Office visit with [place] in 4-6 weeks. History of minimally elevated transaminases previously and a history of fatty-appearing liver on CT previously . Will repeat hepatic profile at next office visit.

## 2011-12-15 ENCOUNTER — Ambulatory Visit (INDEPENDENT_AMBULATORY_CARE_PROVIDER_SITE_OTHER): Payer: Medicare Other | Admitting: Gastroenterology

## 2011-12-15 DIAGNOSIS — R197 Diarrhea, unspecified: Secondary | ICD-10-CM | POA: Diagnosis not present

## 2011-12-16 NOTE — Progress Notes (Signed)
ifobt to be addressed by Dr. Gala Romney, ordering provider.

## 2011-12-19 ENCOUNTER — Other Ambulatory Visit: Payer: Self-pay | Admitting: Internal Medicine

## 2011-12-19 DIAGNOSIS — K921 Melena: Secondary | ICD-10-CM

## 2011-12-19 MED ORDER — PEG 3350-KCL-NA BICARB-NACL 420 G PO SOLR: 4000.0000 mL | ORAL | Status: DC

## 2011-12-19 NOTE — Progress Notes (Signed)
Patient is scheduled for TCS+/-EGD w/RMR on 01/07/12 I have mailed him instructions and I have LMOM for him to call me and confirm

## 2011-12-29 ENCOUNTER — Encounter (HOSPITAL_COMMUNITY): Payer: Self-pay | Admitting: Pharmacy Technician

## 2012-01-06 MED ORDER — SODIUM CHLORIDE 0.45 % IV SOLN
INTRAVENOUS | Status: DC
  Administered 2012-01-07: 1000 mL via INTRAVENOUS

## 2012-01-07 ENCOUNTER — Encounter (HOSPITAL_COMMUNITY): Payer: Self-pay | Admitting: *Deleted

## 2012-01-07 ENCOUNTER — Ambulatory Visit (HOSPITAL_COMMUNITY)
Admission: RE | Admit: 2012-01-07 | Discharge: 2012-01-07 | Disposition: A | Discharge status: Home / Self Care | Payer: Medicare Other | Source: Ambulatory Visit | Attending: Internal Medicine | Admitting: Internal Medicine

## 2012-01-07 ENCOUNTER — Encounter (HOSPITAL_COMMUNITY)
Admission: RE | Disposition: A | Discharge status: Home / Self Care | Payer: Self-pay | Source: Ambulatory Visit | Attending: Internal Medicine

## 2012-01-07 DIAGNOSIS — K219 Gastro-esophageal reflux disease without esophagitis: Secondary | ICD-10-CM | POA: Insufficient documentation

## 2012-01-07 DIAGNOSIS — K449 Diaphragmatic hernia without obstruction or gangrene: Secondary | ICD-10-CM | POA: Insufficient documentation

## 2012-01-07 DIAGNOSIS — K2940 Chronic atrophic gastritis without bleeding: Secondary | ICD-10-CM | POA: Insufficient documentation

## 2012-01-07 DIAGNOSIS — K2990 Gastroduodenitis, unspecified, without bleeding: Secondary | ICD-10-CM | POA: Diagnosis not present

## 2012-01-07 DIAGNOSIS — D126 Benign neoplasm of colon, unspecified: Secondary | ICD-10-CM | POA: Insufficient documentation

## 2012-01-07 DIAGNOSIS — K2960 Other gastritis without bleeding: Secondary | ICD-10-CM | POA: Diagnosis not present

## 2012-01-07 DIAGNOSIS — K5730 Diverticulosis of large intestine without perforation or abscess without bleeding: Secondary | ICD-10-CM | POA: Insufficient documentation

## 2012-01-07 DIAGNOSIS — R195 Other fecal abnormalities: Secondary | ICD-10-CM | POA: Insufficient documentation

## 2012-01-07 DIAGNOSIS — R1310 Dysphagia, unspecified: Secondary | ICD-10-CM | POA: Diagnosis not present

## 2012-01-07 DIAGNOSIS — D131 Benign neoplasm of stomach: Secondary | ICD-10-CM | POA: Diagnosis not present

## 2012-01-07 DIAGNOSIS — K2970 Gastritis, unspecified, without bleeding: Secondary | ICD-10-CM | POA: Diagnosis not present

## 2012-01-07 DIAGNOSIS — K222 Esophageal obstruction: Secondary | ICD-10-CM | POA: Diagnosis not present

## 2012-01-07 DIAGNOSIS — I10 Essential (primary) hypertension: Secondary | ICD-10-CM | POA: Insufficient documentation

## 2012-01-07 DIAGNOSIS — K921 Melena: Secondary | ICD-10-CM

## 2012-01-07 HISTORY — PX: COLONOSCOPY: SHX5424

## 2012-01-07 HISTORY — PX: ESOPHAGOGASTRODUODENOSCOPY: SHX5428

## 2012-01-07 SURGERY — COLONOSCOPY
Anesthesia: Moderate Sedation

## 2012-01-07 MED ORDER — BUTAMBEN-TETRACAINE-BENZOCAINE 2-2-14 % EX AERO
INHALATION_SPRAY | CUTANEOUS | Status: DC | PRN
  Administered 2012-01-07: 2 via TOPICAL

## 2012-01-07 MED ORDER — MEPERIDINE HCL 100 MG/ML IJ SOLN
INTRAMUSCULAR | Status: DC | PRN
  Administered 2012-01-07: 50 mg via INTRAVENOUS
  Administered 2012-01-07 (×2): 25 mg via INTRAVENOUS

## 2012-01-07 MED ORDER — MIDAZOLAM HCL 5 MG/5ML IJ SOLN
INTRAMUSCULAR | Status: AC
  Filled 2012-01-07: qty 10

## 2012-01-07 MED ORDER — MEPERIDINE HCL 100 MG/ML IJ SOLN
INTRAMUSCULAR | Status: AC
  Filled 2012-01-07: qty 2

## 2012-01-07 MED ORDER — MIDAZOLAM HCL 5 MG/5ML IJ SOLN
INTRAMUSCULAR | Status: DC | PRN
  Administered 2012-01-07: 1 mg via INTRAVENOUS
  Administered 2012-01-07 (×2): 2 mg via INTRAVENOUS

## 2012-01-07 MED ORDER — STERILE WATER FOR IRRIGATION IR SOLN
Status: DC | PRN
  Administered 2012-01-07: 11:00:00

## 2012-01-07 NOTE — Op Note (Signed)
NAME:  [name], [name]               ACCOUNT NO.:  [id number]  MEDICAL RECORD NO.:  [id number]  LOCATION:                                 FACILITY:  PHYSICIAN:  R. Garfield Cornea, MD [place]:  [birth year]  DATE OF PROCEDURE:  01/07/2012 DATE OF DISCHARGE:  01/07/2012                              OPERATIVE REPORT   PROCEDURE:  Colonoscopy biopsy.  INDICATIONS FOR PROCEDURE:  A 78 year old gentleman with Hemoccult positive stool  recently.  Notes he has had some sinus problems and may have swallowed some blood from the sinuses.  Colonoscopy is now being done.  Risks, benefits, limitations, alternatives, imponderables have been discussed, questions answered.  Please see the documentation medical record.  PROCEDURE NOTE:  O2 saturation, blood pressure, pulse, respirations were monitored throughout the entire procedure.  Conscious sedation of Versed 5 mg IV and Demerol 100 mg IV in divided doses.  INSTRUMENT:  Pentax video chip system.  FINDINGS:  Digital rectal exam revealed no abnormalities.  Endoscopic findings, prep was marginal to poor with some formed stool elements throughout his colon.  Examination of rectal mucosa including retroflexed view of the anal verge demonstrated no abnormalities.  Colon:  Colonic mucosa was surveyed from the rectosigmoid junction through the left, transverse, right colon to the appendiceal orifice, ileocecal valve/cecum.  These structures were well seen, photographed for the record.  From this level, scope was slowly and cautiously withdrawn.  All previously discussed surfaces were again seen.  The patient had diverticula throughout his colon from the rectosigmoid junction all way to the cecum.  There was a single diminutive polyp in the mid sigmoid segment.  The remainder of colonic mucosa appeared grossly normal,  however, poor prep hampered the examination.  The smaller lesion may not have been seen. The polyp in the sigmoid was  cold biopsy/removed.  The patient tolerated the procedure well.  IMPRESSION: 1. Grossly normal rectum. 2. Pancolonic diverticulosis. 3. Colonic polyp - removed as described above.  RECOMMENDATIONS:  Diverticulosis and polyp  information provided.  Follow up pathology.  See EGD report.     Bridgette Habermann, MD FACP [facility]     RMR/MEDQ  D:  01/07/2012  T:  01/07/2012  Job:  [id number]  cc:   Sherrilee Gilles. Gerarda Fraction, MD Fax: [phone]

## 2012-01-07 NOTE — H&P (View-Only) (Signed)
Primary Care Physician:  Glo Herring., MD Primary Gastroenterologist:  Dr. Gala Romney  Pre-Procedure History & Physical: HPI:  [name] is a 78 y.o. male here for several day history of abdominal bloating. History of GERD. History Schatzki's ring  - dilated. Was seen here last year for a acute diarrheal illness after consuming [place] food. Stool studies negative. The symptoms resolved. He stopped taking protonix recently bloating has improved considerably. Has 1-2 bowel movements daily - does not always feel he completely evacuates. Has trouble with the anorectal hygiene from time to time because of this problem. Last colonoscopy 2006 demonstrated diverticulosis. Has not been on regular fiber supplement. Follow Dr. Dr. Tressie Stalker for mild thrombocytopenia. Occasional EtOH-1-2 times weekly. No dysphagia. No melena no rectal bleeding. Family history GI neoplasia.  Mild splenomegaly and fatty liver on prior CT.  Past Medical History  Diagnosis Date  . CAD (coronary artery disease) of artery bypass graft 2006  . Neuropathy   . GERD (gastroesophageal reflux disease)   . HTN (hypertension)   . BPH (benign prostatic hyperplasia)   . Fatty liver   . Schatzki's ring     non critical    Past Surgical History  Procedure Date  . Coronary artery bypass graft 2006    placed stent for crossed coronary arteries  . Rotater cuff     repair  . Cataract extraction 2011  . Tonsillectomy   . Esophagogastroduodenoscopy 2006    Dr. Ruthell Rummage Schatzki's ring, s/p 58-F Maloney dilation  . Colonoscopy 2006    Dr. Margarito Courser pancolonic diverticula    Prior to Admission medications   Medication Sig Start Date End Date Taking? Authorizing Provider  ALPRAZolam Duanne Moron) 0.5 MG tablet Take 0.5 mg by mouth at bedtime as needed.     Yes Historical Provider, MD  amLODipine (NORVASC) 10 MG tablet Take 10 mg by mouth daily.   Yes Historical Provider, MD  aspirin 81 MG tablet Take 81 mg by mouth 2  (two) times a week.   Yes Historical Provider, MD  B Complex Vitamins (B COMPLEX PO) Take 1 mL by mouth daily.   Yes Historical Provider, MD  doxazosin (CARDURA XL) 8 MG 24 hr tablet Take 8 mg by mouth daily with breakfast.     Yes Historical Provider, MD  flunisolide (NASALIDE) 0.025 % SOLN Inhale 2 sprays into the lungs 2 (two) times daily.   Yes Historical Provider, MD  gabapentin (NEURONTIN) 300 MG capsule Take 300 mg by mouth 3 (three) times daily.     Yes Historical Provider, MD  hydrochlorothiazide 25 MG tablet Take 12.5 mg by mouth daily.     Yes Historical Provider, MD  lisinopril (PRINIVIL,ZESTRIL) 10 MG tablet Take 10 mg by mouth daily.     Yes Historical Provider, MD  loperamide (LOPERAMIDE A-D) 2 MG tablet Take 2 mg by mouth 4 (four) times daily as needed.     Yes Historical Provider, MD  metoprolol succinate (TOPROL-XL) 25 MG 24 hr tablet Take 12.5 mg by mouth daily.     Yes Historical Provider, MD  pantoprazole (PROTONIX) 40 MG tablet Take 40 mg by mouth daily.     Yes Historical Provider, MD    Allergies as of 12/12/2011 - Review Complete 12/12/2011  Allergen Reaction Noted  . Antihistamines, chlorpheniramine-type Anxiety 08/26/2011    Family History  Problem Relation Age of Onset  . Heart attack Mother 44    deceased  . Heart attack  80    deceased  .  Colon cancer Neg Hx     History   Social History  . Marital Status: Married    Spouse Name: N/A    Number of Children: N/A  . Years of Education: N/A   Occupational History  . Not on file.   Social History Main Topics  . Smoking status: Former Smoker -- 1.0 packs/day for 40 years    Types: Cigarettes    Quit date: 02/04/2003  . Smokeless tobacco: Never Used  . Alcohol Use: Yes     Comment: occassional drinker  . Drug Use: No  . Sexually Active: No   Other Topics Concern  . Not on file   Social History Narrative  . No narrative on file    Review of Systems: See HPI, otherwise negative  ROS  Physical Exam: BP 120/64  Pulse 73  Temp 98.2 F (36.8 C) (Temporal)  Ht 5' 8.5" (1.74 m)  Wt 198 lb 12.8 oz (90.175 kg)  BMI 29.79 kg/m2 General:   Alert,  Well-developed, well-nourished, pleasant and cooperative in NAD Skin:  Intact without significant lesions or rashes. Eyes:  Sclera clear, no icterus.   Conjunctiva pink. Ears:  Normal auditory acuity. Nose:  No deformity, discharge,  or lesions. Mouth:  No deformity or lesions. Neck:  Supple; no masses or thyromegaly. No significant cervical adenopathy. Lungs:  Clear throughout to auscultation.   No wheezes, crackles, or rhonchi. No acute distress. Heart:  Regular rate and rhythm; no murmurs, clicks, rubs,  or gallops. Abdomen: Obese. Positive bowel sounds. No succussion splash Non-distended, normal bowel sounds.  Soft and nontender without appreciable mass or hepatosplenomegaly.  Pulses:  Normal pulses noted. Extremities:  Without clubbing or edema.  Impression/Plan: 78 year old gentleman with recent nonspecific abdominal bloating symptoms now resolved with cessation of Protonix. This may be a coincidence. He does not look at all acutely ill; has a benign abdominal exam currently. Has some trouble with a emptying after having a bowel movement. He has known diverticulosis.  Recommendations: Weight loss. Antireflux diet. Trial of Crestor a probiotic. Benefiber 2 tablespoons daily. The patient is to return 1 stool sample for occult blood testing. Office visit with [place] in 4-6 weeks. History of minimally elevated transaminases previously and a history of fatty-appearing liver on CT previously . Will repeat hepatic profile at next office visit.

## 2012-01-07 NOTE — Interval H&P Note (Signed)
History and Physical Interval Note:  01/07/2012 11:12 AM  [name]  has presented today for surgery, with the diagnosis of HEME POSITIVE STOOLS  The various methods of treatment have been discussed with the patient and family. After consideration of risks, benefits and other options for treatment, the patient has consented to  Procedure(s) (LRB) with comments: COLONOSCOPY (N/A) - 12:00 ESOPHAGOGASTRODUODENOSCOPY (EGD) (N/A) as a surgical intervention .  The patient's history has been reviewed, patient examined, no change in status, stable for surgery.  I have reviewed the patient's chart and labs.  Questions were answered to the patient's satisfaction.     [name]  Stool occult blood positive for blood. Colonoscopy today. Patient having some typical reflux symptoms and recurrent esophageal dysphagia. Patient tells me that abdominal bloating improved significantly on Restora.  Colonoscopy and EGD with dilation as appropriate now being performed.  The risks, benefits, limitations, imponderables and alternatives regarding both EGD and colonoscopy have been reviewed with the patient. Questions have been answered. All parties agreeable.

## 2012-01-07 NOTE — Op Note (Signed)
NAME:  [name], [name]               ACCOUNT NO.:  [id number]  MEDICAL RECORD NO.:  [id number]  LOCATION:  [facility]                          FACILITY:  [facility]  PHYSICIAN:  R. Garfield Cornea, MD [place]:  1933/07/21  DATE OF PROCEDURE:  01/07/2012 DATE OF DISCHARGE:                              OPERATIVE REPORT   PROCEDURE:  EGD with Venia Minks dilation followed by gastric biopsy.  INDICATIONS FOR PROCEDURE:  A 78 year old gentleman, who presents for further evaluation of Hemoccult-positive stool.  Colonoscopy demonstrated diverticulosis with a colonic polyp - removed.  EGD now being performed.  The patient related to me today, was having recurrent esophageal dysphagia in setting of a known Schatzki's ring.  He came off acid suppression, recently feeling Protonix produced some side affects. EGD now being done.  Risks, benefits, limitations, alternatives, imponderables have been discussed, questions answered.  Potential plans for esophageal dilation reviewed.  PROCEDURE NOTE:  O2 saturation, blood pressure, pulse, respirations were monitored throughout the entire procedure.  Conscious sedation of Versed 5 mg IV and Demerol 100 mg IV in divided doses.  INSTRUMENT:  Pentax video chip system, Cetacaine spray.  FINDINGS:  Examination of tubular esophagus revealed noncritical appearing Schatzki's ring, otherwise esophageal mucosa appeared normal. EG junction easily traversed.  Stomach:  Gastric cavity was emptied and insufflated well with air. Thorough exam of the gastric mucosa including retroflexed, proximal stomach, esophagogastric junction demonstrated a small hiatal hernia. Multiple hyperplastic appearing polyps and some focal erosions in the antrum.  No ulcer infiltrating process was observed.  Pylorus was patent and easily traversed.  Examination of the bulb and second portion revealed no abnormalities.  Therapeutic/diagnostic maneuvers performed. Scope was withdrawn.  A  56-French Maloney dilator passed a full insertion easily.  Capped subsequently, a 58-French Maloney dilator passed a full insertion easily.  A look back revealed superficial tear through the UES and adherent to the GE junction consistent with good rupture of the ring.  There was minimal bleeding and no apparent complication to these maneuvers.  Finally, biopsies of the eroded antrum and biopsies of one of the polyps was taken separately for histologic study.  The patient tolerated the procedure well and was nonreactive to endoscopy.  IMPRESSION: 1. Schatzki's ring - status post dilation as described above. 2. Hiatal hernia. 3. Gastric polyps - status post biopsy. 4. Antral erosions - status post biopsy.  RECOMMENDATIONS: 1. Begin Prevacid 30 mg orally daily. 2. Follow up on pathology. 3. Go to my office to get samples of Restore 1 capsule daily.  This     has helped in tremendously.  Further recommendations to follow, pending review of pathology report. See EGD report.     Bridgette Habermann, MD FACP [facility]     RMR/MEDQ  D:  01/07/2012  T:  01/07/2012  Job:  [id number]  cc:   Sherrilee Gilles. Gerarda Fraction, MD Fax: [phone]

## 2012-01-08 DIAGNOSIS — E782 Mixed hyperlipidemia: Secondary | ICD-10-CM | POA: Diagnosis not present

## 2012-01-08 DIAGNOSIS — I2510 Atherosclerotic heart disease of native coronary artery without angina pectoris: Secondary | ICD-10-CM | POA: Diagnosis not present

## 2012-01-08 DIAGNOSIS — Z9861 Coronary angioplasty status: Secondary | ICD-10-CM | POA: Diagnosis not present

## 2012-01-09 ENCOUNTER — Encounter: Payer: Self-pay | Admitting: Internal Medicine

## 2012-01-12 ENCOUNTER — Encounter (HOSPITAL_COMMUNITY): Payer: Self-pay | Admitting: Internal Medicine

## 2012-01-12 ENCOUNTER — Encounter: Payer: Self-pay | Admitting: *Deleted

## 2012-02-13 ENCOUNTER — Encounter: Payer: Self-pay | Admitting: Internal Medicine

## 2012-02-13 ENCOUNTER — Other Ambulatory Visit (HOSPITAL_COMMUNITY): Payer: Self-pay | Admitting: Family Medicine

## 2012-02-13 ENCOUNTER — Ambulatory Visit (INDEPENDENT_AMBULATORY_CARE_PROVIDER_SITE_OTHER): Payer: Medicare Other | Admitting: Internal Medicine

## 2012-02-13 ENCOUNTER — Ambulatory Visit (HOSPITAL_COMMUNITY)
Admission: RE | Admit: 2012-02-13 | Discharge: 2012-02-13 | Disposition: A | Discharge status: Home / Self Care | Payer: Medicare Other | Source: Ambulatory Visit | Attending: Family Medicine | Admitting: Family Medicine

## 2012-02-13 ENCOUNTER — Other Ambulatory Visit: Payer: Self-pay

## 2012-02-13 VITALS — BP 145/65 | HR 67 | Temp 98.2°F | Ht 68.0 in | Wt 202.0 lb

## 2012-02-13 DIAGNOSIS — K219 Gastro-esophageal reflux disease without esophagitis: Secondary | ICD-10-CM

## 2012-02-13 DIAGNOSIS — M545 Low back pain, unspecified: Secondary | ICD-10-CM | POA: Diagnosis not present

## 2012-02-13 DIAGNOSIS — R1310 Dysphagia, unspecified: Secondary | ICD-10-CM

## 2012-02-13 DIAGNOSIS — R110 Nausea: Secondary | ICD-10-CM | POA: Diagnosis not present

## 2012-02-13 DIAGNOSIS — R197 Diarrhea, unspecified: Secondary | ICD-10-CM | POA: Diagnosis not present

## 2012-02-13 DIAGNOSIS — Z6829 Body mass index (BMI) 29.0-29.9, adult: Secondary | ICD-10-CM | POA: Diagnosis not present

## 2012-02-13 DIAGNOSIS — M5137 Other intervertebral disc degeneration, lumbosacral region: Secondary | ICD-10-CM | POA: Diagnosis not present

## 2012-02-13 LAB — CBC WITH DIFFERENTIAL/PLATELET
Basophils Absolute: 0 10*3/uL (ref 0.0–0.1)
Basophils Relative: 0 % (ref 0–1)
HCT: 38 % — ABNORMAL LOW (ref 39.0–52.0)
Hemoglobin: 13.5 g/dL (ref 13.0–17.0)
Lymphocytes Relative: 29 % (ref 12–46)
MCHC: 35.5 g/dL (ref 30.0–36.0)
Neutro Abs: 2.7 10*3/uL (ref 1.7–7.7)
Neutrophils Relative %: 58 % (ref 43–77)
RDW: 12.8 % (ref 11.5–15.5)
WBC: 4.7 10*3/uL (ref 4.0–10.5)

## 2012-02-13 LAB — HEPATIC FUNCTION PANEL
AST: 31 U/L (ref 0–37)
Albumin: 4.6 g/dL (ref 3.5–5.2)
Alkaline Phosphatase: 65 U/L (ref 39–117)
Total Bilirubin: 1.3 mg/dL — ABNORMAL HIGH (ref 0.3–1.2)
Total Protein: 7 g/dL (ref 6.0–8.3)

## 2012-02-13 NOTE — Patient Instructions (Signed)
Continue Protonix for GERD  LFTs and CBC within the next week  Further recommendations to follow

## 2012-02-13 NOTE — Progress Notes (Signed)
Primary Care Physician:  Glo Herring., MD Primary Gastroenterologist:  Dr. Gala Romney  Pre-Procedure History & Physical: HPI:  [name] is a 79 y.o. male here for followup of dysphagia and Hemoccult-positive stool colonoscopy and EGD last month demonstrated a benign colonic polyp and pancolonic diverticulosis. He also had a benign gastric polyp. Schatzki's ring was dilated. Dysphagia has resolved. He had a sinus infection and may have swallowed some blood leading to the Hemoccult-positive stool. Reflux symptoms well controlled on Protonix 40 mg orally daily. He has not had a followup CBC recently. He is followed by Dr. Tressie [name] for thrombocytopenia. Had a mildly elevated ALT previously; followup lab work is planned at this time. He tells me he's having some pelvic and thigh pain and feels that he has "prostatitis" once again. He is planning see Dr. Gerarda [name] this afternoon. He was having some abdominal bloating. Protonixs was held previously but he tells me he's back on that at this time and doing well.  Past Medical History  Diagnosis Date  . CAD (coronary artery disease) of artery bypass graft 2006  . Neuropathy   . GERD (gastroesophageal reflux disease)   . HTN (hypertension)   . BPH (benign prostatic hyperplasia)   . Fatty liver   . Schatzki's ring     non critical  . Hiatal hernia     Past Surgical History  Procedure Date  . Coronary artery bypass graft 2006    placed stent for crossed coronary arteries  . Rotater cuff     repair  . Cataract extraction 2011  . Tonsillectomy   . Esophagogastroduodenoscopy 2006    Dr. Ruthell Rummage Schatzki's ring, s/p 58-F Maloney dilation  . Colonoscopy 2006    Dr. Margarito Courser pancolonic diverticula  . Colonoscopy 01/07/2012    Dr. Abbe [place] normal rectum, pancolonic diverticulosis, hyperplastic polyp  . Esophagogastroduodenoscopy 01/07/2012    Dr. Gareth Morgan ring, hiatal hernia, fundic gland polyp    Prior to Admission  medications   Medication Sig Start Date End Date Taking? Authorizing Provider  amLODipine (NORVASC) 10 MG tablet Take 10 mg by mouth daily.   Yes Historical Provider, MD  aspirin 81 MG tablet Take 81 mg by mouth 2 (two) times a week.   Yes Historical Provider, MD  doxazosin (CARDURA XL) 8 MG 24 hr tablet Take 8 mg by mouth daily with breakfast.     Yes Historical Provider, MD  flunisolide (NASALIDE) 0.025 % SOLN Inhale 2 sprays into the lungs 2 (two) times daily.   Yes Historical Provider, MD  gabapentin (NEURONTIN) 300 MG capsule Take 300 mg by mouth 3 (three) times daily.     Yes Historical Provider, MD  hydrochlorothiazide 25 MG tablet Take 12.5 mg by mouth daily.     Yes Historical Provider, MD  lisinopril (PRINIVIL,ZESTRIL) 10 MG tablet Take 10 mg by mouth daily.     Yes Historical Provider, MD  loperamide (LOPERAMIDE A-D) 2 MG tablet Take 2 mg by mouth 4 (four) times daily as needed. Diarrhea.   Yes Historical Provider, MD  metoprolol succinate (TOPROL-XL) 25 MG 24 hr tablet Take 12.5 mg by mouth daily.     Yes Historical Provider, MD  pantoprazole (PROTONIX) 40 MG tablet Take 40 mg by mouth daily.     Yes Historical Provider, MD  ALPRAZolam Duanne Moron) 0.5 MG tablet Take 0.5 mg by mouth at bedtime as needed. Sleep.    Historical Provider, MD  polyethylene glycol-electrolytes (TRILYTE) 420 G solution Take 4,000 mLs by mouth as directed. 12/19/11  Daneil Dolin, MD    Allergies as of 02/13/2012 - Review Complete 02/13/2012  Allergen Reaction Noted  . Antihistamines, chlorpheniramine-type Anxiety 08/26/2011    Family History  Problem Relation Age of Onset  . Heart attack Mother 68    deceased  . Heart attack  80    deceased  . Colon cancer Neg Hx     History   Social History  . Marital Status: Married    Spouse Name: N/A    Number of Children: N/A  . Years of Education: N/A   Occupational History  . Not on file.   Social History Main Topics  . Smoking status: Former Smoker  -- 1.0 packs/day for 40 years    Types: Cigarettes    Quit date: 02/04/2003  . Smokeless tobacco: Never Used  . Alcohol Use: Yes     Comment: occassional drinker  . Drug Use: No  . Sexually Active: No   Other Topics Concern  . Not on file   Social History Narrative  . No narrative on file    Review of Systems: See HPI, otherwise negative ROS  Physical Exam: BP 145/65  Pulse 67  Temp 98.2 F (36.8 C) (Oral)  Ht 5' 8"  (1.727 m)  Wt 202 lb (91.627 kg)  BMI 30.71 kg/m2 General:   Alert,  Well-developed, well-nourished, pleasant and cooperative in NAD Skin:  Intact without significant lesions or rashes. Eyes:  Sclera clear, no icterus.   Conjunctiva pink. Ears:  Normal auditory acuity. Nose:  No deformity, discharge,  or lesions. Mouth:  No deformity or lesions. Neck:  Supple; no masses or thyromegaly. No significant cervical adenopathy. Lungs:  Clear throughout to auscultation.   No wheezes, crackles, or rhonchi. No acute distress. Heart:  Regular rate and rhythm; no murmurs, clicks, rubs,  or gallops. Abdomen: Non-distended, normal bowel sounds.  Soft and nontender without appreciable mass or hepatosplenomegaly.  Pulses:  Normal pulses noted. Extremities:  Without clubbing or edema.  Impression/Plan:  Dysphagia resolved status post dilation of a Schatzki's ring. GERD well-controlled on for tonics. History Hemoccult-positive stool workup as outlined above. We need to check a CBC to see where we stand. Also noted to reassess LFTs. I told him deathly follow through was followed up on with Dr. Gerarda [name] later today.  I'll make further recommendations once I have a CBC and LFTs on file for review.

## 2012-02-17 NOTE — Progress Notes (Signed)
Results faxed to Dr Gerarda Fraction and Dr Tressie Stalker

## 2012-03-08 ENCOUNTER — Other Ambulatory Visit (HOSPITAL_COMMUNITY): Payer: Medicare Other

## 2012-03-10 ENCOUNTER — Ambulatory Visit (HOSPITAL_COMMUNITY): Payer: Medicare Other | Admitting: Oncology

## 2012-03-19 ENCOUNTER — Ambulatory Visit (HOSPITAL_COMMUNITY): Payer: Medicare Other | Admitting: Oncology

## 2012-03-23 ENCOUNTER — Encounter (HOSPITAL_COMMUNITY): Payer: Self-pay | Admitting: Oncology

## 2012-03-23 ENCOUNTER — Encounter (HOSPITAL_COMMUNITY): Payer: Medicare Other | Attending: Oncology | Admitting: Oncology

## 2012-03-23 VITALS — BP 126/66 | HR 84 | Temp 98.3°F | Resp 18 | Wt 204.4 lb

## 2012-03-23 DIAGNOSIS — D696 Thrombocytopenia, unspecified: Secondary | ICD-10-CM

## 2012-03-23 DIAGNOSIS — I10 Essential (primary) hypertension: Secondary | ICD-10-CM | POA: Diagnosis not present

## 2012-03-23 DIAGNOSIS — D649 Anemia, unspecified: Secondary | ICD-10-CM | POA: Diagnosis not present

## 2012-03-23 NOTE — Patient Instructions (Addendum)
.  [facility] Discharge Instructions  RECOMMENDATIONS MADE BY THE CONSULTANT AND ANY TEST RESULTS WILL BE SENT TO YOUR REFERRING PHYSICIAN.  EXAM FINDINGS BY THE PHYSICIAN TODAY AND SIGNS OR SYMPTOMS TO REPORT TO CLINIC OR PRIMARY PHYSICIAN:  We will do labs in 7 months then to see our physcian assistant.  SPECIAL INSTRUCTIONS/FOLLOW-UP: Work on your weight loss.   Thank you for choosing [facility] to provide your oncology and hematology care.  To afford each patient quality time with our providers, please arrive at least 15 minutes before your scheduled appointment time.  With your help, our goal is to use those 15 minutes to complete the necessary work-up to ensure our physicians have the information they need to help with your evaluation and healthcare recommendations.    Effective January 1st, 2014, we ask that you re-schedule your appointment with our physicians should you arrive 10 or more minutes late for your appointment.  We strive to give you quality time with our providers, and arriving late affects you and other patients whose appointments are after yours.    Again, thank you for choosing [facility]  Our hope is that these requests will decrease the amount of time that you wait before being seen by our physicians.       _____________________________________________________________  Should you have questions after your visit to [facility], please contact our office at [phone] between the hours of 8:30 a.m. and 5:00 p.m.  Voicemails left after 4:30 p.m. will not be returned until the following business day.  For prescription refill requests, have your pharmacy contact our office with your prescription refill request.

## 2012-03-23 NOTE — Progress Notes (Signed)
#  1 minimal thrombocytopenia stable recent values. He also has occasional mild anemia which is also stable. I suspect these are related to #2 #2 steatohepatitis with splenomegaly #3 obesity #4 CABG in the past #5 hypertension #6 GERD #7 degenerative joint disease for which she was recently treated with steroids. His steroids were used after his blood work in January. He has gained 7 pounds since I saw him. I would just like to see him in 7 months with repeat blood work sooner he has clinical problems with bleeding etc. he is fine with this plan. He needs no intervention presently.

## 2012-05-12 DIAGNOSIS — I2510 Atherosclerotic heart disease of native coronary artery without angina pectoris: Secondary | ICD-10-CM | POA: Diagnosis not present

## 2012-05-12 DIAGNOSIS — I10 Essential (primary) hypertension: Secondary | ICD-10-CM | POA: Diagnosis not present

## 2012-05-12 DIAGNOSIS — E782 Mixed hyperlipidemia: Secondary | ICD-10-CM | POA: Diagnosis not present

## 2012-05-17 DIAGNOSIS — Z6830 Body mass index (BMI) 30.0-30.9, adult: Secondary | ICD-10-CM | POA: Diagnosis not present

## 2012-05-17 DIAGNOSIS — J019 Acute sinusitis, unspecified: Secondary | ICD-10-CM | POA: Diagnosis not present

## 2012-05-17 DIAGNOSIS — T148 Other injury of unspecified body region: Secondary | ICD-10-CM | POA: Diagnosis not present

## 2012-05-20 ENCOUNTER — Encounter (HOSPITAL_COMMUNITY): Payer: Self-pay | Admitting: Emergency Medicine

## 2012-05-20 ENCOUNTER — Other Ambulatory Visit: Payer: Self-pay

## 2012-05-20 ENCOUNTER — Emergency Department (HOSPITAL_COMMUNITY)
Admission: EM | Admit: 2012-05-20 | Discharge: 2012-05-20 | Disposition: A | Discharge status: Home / Self Care | Payer: Medicare Other | Attending: Emergency Medicine | Admitting: Emergency Medicine

## 2012-05-20 DIAGNOSIS — I2510 Atherosclerotic heart disease of native coronary artery without angina pectoris: Secondary | ICD-10-CM | POA: Insufficient documentation

## 2012-05-20 DIAGNOSIS — K219 Gastro-esophageal reflux disease without esophagitis: Secondary | ICD-10-CM | POA: Insufficient documentation

## 2012-05-20 DIAGNOSIS — J3489 Other specified disorders of nose and nasal sinuses: Secondary | ICD-10-CM | POA: Diagnosis not present

## 2012-05-20 DIAGNOSIS — G589 Mononeuropathy, unspecified: Secondary | ICD-10-CM | POA: Diagnosis not present

## 2012-05-20 DIAGNOSIS — Z87891 Personal history of nicotine dependence: Secondary | ICD-10-CM | POA: Diagnosis not present

## 2012-05-20 DIAGNOSIS — Z8719 Personal history of other diseases of the digestive system: Secondary | ICD-10-CM | POA: Insufficient documentation

## 2012-05-20 DIAGNOSIS — Z79899 Other long term (current) drug therapy: Secondary | ICD-10-CM | POA: Diagnosis not present

## 2012-05-20 DIAGNOSIS — I10 Essential (primary) hypertension: Secondary | ICD-10-CM

## 2012-05-20 DIAGNOSIS — Z7982 Long term (current) use of aspirin: Secondary | ICD-10-CM | POA: Insufficient documentation

## 2012-05-20 DIAGNOSIS — N400 Enlarged prostate without lower urinary tract symptoms: Secondary | ICD-10-CM | POA: Insufficient documentation

## 2012-05-20 DIAGNOSIS — R6889 Other general symptoms and signs: Secondary | ICD-10-CM | POA: Diagnosis not present

## 2012-05-20 NOTE — ED Notes (Signed)
Pt reports having bouts of hypertension throughout yesterday and this morning. Reports taking HTN medication with no effect. Denies chest pain, SOB, dizziness.

## 2012-05-20 NOTE — ED Provider Notes (Signed)
History     CSN: [id number]  Arrival date & time 05/20/12  0436   First MD Initiated Contact with Patient 05/20/12 [phone]      Chief Complaint  Patient presents with  . Hypertension    (Consider location/radiation/quality/duration/timing/severity/associated sxs/prior treatment) The history is provided by the patient.   patient reports he woke up in the middle the night and was having a slight sense of nasal congestion and fullness in his forehead and therefore took his blood pressure note his blood pressure to be 035 systolic and thus he became concerned checked his blood pressure several more times again the emergency department.  He states he's been compliant with his blood pressure medicines for which he takes hydrochlorothiazide, lisinopril, Toprol, Norvasc.  He does have a history of coronary artery bypass grafting.  No prior history of stroke.  He denies headache or weakness of his upper lower extremities.  No altered mental status or confusion per family.  No chest pain or shortness of breath.  No palpitations.  No abdominal pain.  No nausea vomiting or diarrhea.  He denies chest pain or back pain.  At this time he is without any symptoms at all on arrival to the emergency department.  He states he feels fine and that he was simply concerned about the degree of his blood pressure elevation.  He is without any focal complaints.  No recent illness.  He is currently on doxycycline for questionable tick bite.  He wonders whether or not this could have elevated his blood pressure.  Past Medical History  Diagnosis Date  . CAD (coronary artery disease) of artery bypass graft 2006  . Neuropathy   . GERD (gastroesophageal reflux disease)   . HTN (hypertension)   . BPH (benign prostatic hyperplasia)   . Fatty liver   . Schatzki's ring     non critical  . Hiatal hernia     Past Surgical History  Procedure Laterality Date  . Coronary artery bypass graft  2006    placed stent for crossed  coronary arteries  . Rotater cuff      repair  . Cataract extraction  2011  . Tonsillectomy    . Esophagogastroduodenoscopy  2006    Dr. Ruthell Rummage Schatzki's ring, s/p 58-F Maloney dilation  . Colonoscopy  2006    Dr. Margarito Courser pancolonic diverticula  . Colonoscopy  01/07/2012    Dr. Abbe Amsterdam normal rectum, pancolonic diverticulosis, hyperplastic polyp  . Esophagogastroduodenoscopy  01/07/2012    Dr. Gareth Morgan ring, hiatal hernia, fundic gland polyp    Family History  Problem Relation Age of Onset  . Heart attack Mother 21    deceased  . Heart attack  80    deceased  . Colon cancer Neg Hx     History  Substance Use Topics  . Smoking status: Former Smoker -- 1.00 packs/day for 40 years    Types: Cigarettes    Quit date: 02/04/2003  . Smokeless tobacco: Never Used  . Alcohol Use: Yes     Comment: occassional drinker      Review of Systems  All other systems reviewed and are negative.    Allergies  Antihistamines, chlorpheniramine-type  Home Medications   Current Outpatient Rx  Name  Route  Sig  Dispense  Refill  . ALPRAZolam (XANAX) 0.5 MG tablet   Oral   Take 0.5 mg by mouth at bedtime as needed. Sleep.         Marland Kitchen amLODipine (NORVASC) 10 MG tablet  Oral   Take 10 mg by mouth daily.         Marland Kitchen aspirin 81 MG tablet   Oral   Take 81 mg by mouth 2 (two) times a week.         . doxazosin (CARDURA XL) 8 MG 24 hr tablet   Oral   Take 8 mg by mouth daily with breakfast.           . Flaxseed, Linseed, (FLAXSEED OIL) 1000 MG CAPS   Oral   Take 1,000 mg by mouth daily.         . flunisolide (NASALIDE) 0.025 % SOLN   Inhalation   Inhale 2 sprays into the lungs 2 (two) times daily.         Marland Kitchen gabapentin (NEURONTIN) 300 MG capsule   Oral   Take 300 mg by mouth 3 (three) times daily.           . hydrochlorothiazide 25 MG tablet   Oral   Take 12.5 mg by mouth daily.           Marland Kitchen lisinopril (PRINIVIL,ZESTRIL) 10 MG  tablet   Oral   Take 10 mg by mouth daily.           Marland Kitchen loperamide (LOPERAMIDE A-D) 2 MG tablet   Oral   Take 2 mg by mouth 4 (four) times daily as needed. Diarrhea.         . metoprolol succinate (TOPROL-XL) 25 MG 24 hr tablet   Oral   Take 12.5 mg by mouth daily.           . pantoprazole (PROTONIX) 40 MG tablet   Oral   Take 40 mg by mouth daily.           . polyethylene glycol-electrolytes (TRILYTE) 420 G solution   Oral   Take 4,000 mLs by mouth as directed.   4000 mL   0     BP 199/78  Pulse 68  Temp(Src) 98.1 F (36.7 C) (Oral)  Resp 20  Ht 5' 9"  (1.753 m)  Wt 202 lb (91.627 kg)  BMI 29.82 kg/m2  SpO2 98%  Physical Exam  Nursing note and vitals reviewed. Constitutional: He is oriented to person, place, and time. He appears well-developed and well-nourished.  HENT:  Head: Normocephalic and atraumatic.  Eyes: EOM are normal.  Neck: Normal range of motion.  Cardiovascular: Normal rate, regular rhythm, normal heart sounds and intact distal pulses.   Pulmonary/Chest: Effort normal and breath sounds normal. No respiratory distress.  Abdominal: Soft. He exhibits no distension. There is no tenderness.  Musculoskeletal: Normal range of motion.  Neurological: He is alert and oriented to person, place, and time.  Skin: Skin is warm and dry.  Psychiatric: He has a normal mood and affect. Judgment normal.    ED Course  Procedures (including critical care time)   Date: 05/20/2012  Rate: 59  Rhythm: normal sinus rhythm  QRS Axis: normal  Intervals: normal  ST/T Wave abnormalities: normal  Conduction Disutrbances: none  Narrative Interpretation:   Old EKG Reviewed: No significant changes noted     Labs Reviewed - No data to display No results found.   1. Hypertension       MDM  Asymptomatic HTN.  No chest pain shortness of breath.  EKG is normal.  The patient's discharge blood pressure was 177/66.  I've encouraged the patient to followup with  his physicians regarding his blood pressure.  Also encourage the  patient to not check his blood pressure too much at home as this can lead to increased worry when the patient is completely without symptoms.  We discussed exercise and diet changes.        Hoy Morn, MD 05/20/12 [phone]

## 2012-05-20 NOTE — ED Notes (Signed)
Pt reports taking 325 mg ASA prior to arrival.

## 2012-05-20 NOTE — ED Notes (Signed)
Patient reports "I took extra of pretty much all my blood pressure medication to bring it down but it isn't working". Last dose he took was his Amlodapine at roughly 00:00 this morning.

## 2012-05-24 ENCOUNTER — Encounter: Payer: Self-pay | Admitting: Emergency Medicine

## 2012-06-07 DIAGNOSIS — N529 Male erectile dysfunction, unspecified: Secondary | ICD-10-CM | POA: Diagnosis not present

## 2012-06-07 DIAGNOSIS — N320 Bladder-neck obstruction: Secondary | ICD-10-CM | POA: Diagnosis not present

## 2012-06-07 DIAGNOSIS — N400 Enlarged prostate without lower urinary tract symptoms: Secondary | ICD-10-CM | POA: Diagnosis not present

## 2012-06-21 DIAGNOSIS — J209 Acute bronchitis, unspecified: Secondary | ICD-10-CM | POA: Diagnosis not present

## 2012-06-21 DIAGNOSIS — J029 Acute pharyngitis, unspecified: Secondary | ICD-10-CM | POA: Diagnosis not present

## 2012-07-02 ENCOUNTER — Other Ambulatory Visit (HOSPITAL_COMMUNITY): Payer: Self-pay | Admitting: Family Medicine

## 2012-07-02 ENCOUNTER — Ambulatory Visit (HOSPITAL_COMMUNITY)
Admission: RE | Admit: 2012-07-02 | Discharge: 2012-07-02 | Disposition: A | Discharge status: Home / Self Care | Payer: Medicare Other | Source: Ambulatory Visit | Attending: Family Medicine | Admitting: Family Medicine

## 2012-07-02 DIAGNOSIS — R059 Cough, unspecified: Secondary | ICD-10-CM | POA: Diagnosis not present

## 2012-07-02 DIAGNOSIS — Z6829 Body mass index (BMI) 29.0-29.9, adult: Secondary | ICD-10-CM | POA: Diagnosis not present

## 2012-07-02 DIAGNOSIS — R05 Cough: Secondary | ICD-10-CM | POA: Diagnosis not present

## 2012-07-02 DIAGNOSIS — J209 Acute bronchitis, unspecified: Secondary | ICD-10-CM | POA: Diagnosis not present

## 2012-07-02 DIAGNOSIS — R0989 Other specified symptoms and signs involving the circulatory and respiratory systems: Secondary | ICD-10-CM | POA: Diagnosis not present

## 2012-07-02 DIAGNOSIS — I10 Essential (primary) hypertension: Secondary | ICD-10-CM | POA: Diagnosis not present

## 2012-07-02 DIAGNOSIS — I2510 Atherosclerotic heart disease of native coronary artery without angina pectoris: Secondary | ICD-10-CM | POA: Diagnosis not present

## 2012-09-06 DIAGNOSIS — T148 Other injury of unspecified body region: Secondary | ICD-10-CM | POA: Diagnosis not present

## 2012-09-06 DIAGNOSIS — Z6829 Body mass index (BMI) 29.0-29.9, adult: Secondary | ICD-10-CM | POA: Diagnosis not present

## 2012-09-06 DIAGNOSIS — W57XXXA Bitten or stung by nonvenomous insect and other nonvenomous arthropods, initial encounter: Secondary | ICD-10-CM | POA: Diagnosis not present

## 2012-09-06 DIAGNOSIS — I10 Essential (primary) hypertension: Secondary | ICD-10-CM | POA: Diagnosis not present

## 2012-09-20 DIAGNOSIS — L570 Actinic keratosis: Secondary | ICD-10-CM | POA: Diagnosis not present

## 2012-09-20 DIAGNOSIS — L821 Other seborrheic keratosis: Secondary | ICD-10-CM | POA: Diagnosis not present

## 2012-09-20 DIAGNOSIS — D1800 Hemangioma unspecified site: Secondary | ICD-10-CM | POA: Diagnosis not present

## 2012-09-20 DIAGNOSIS — D485 Neoplasm of uncertain behavior of skin: Secondary | ICD-10-CM | POA: Diagnosis not present

## 2012-10-18 ENCOUNTER — Encounter (HOSPITAL_COMMUNITY): Payer: Medicare Other | Attending: Internal Medicine

## 2012-10-18 DIAGNOSIS — D696 Thrombocytopenia, unspecified: Secondary | ICD-10-CM | POA: Insufficient documentation

## 2012-10-18 LAB — CBC WITH DIFFERENTIAL/PLATELET
Eosinophils Absolute: 0.2 10*3/uL (ref 0.0–0.7)
Eosinophils Relative: 4 % (ref 0–5)
Hemoglobin: 13.4 g/dL (ref 13.0–17.0)
Lymphocytes Relative: 30 % (ref 12–46)
Lymphs Abs: 1.2 10*3/uL (ref 0.7–4.0)
Monocytes Relative: 10 % (ref 3–12)
Neutro Abs: 2.2 10*3/uL (ref 1.7–7.7)
Neutrophils Relative %: 55 % (ref 43–77)
Platelets: 112 10*3/uL — ABNORMAL LOW (ref 150–400)
RBC: 3.87 MIL/uL — ABNORMAL LOW (ref 4.22–5.81)
Smear Review: DECREASED
WBC: 4 10*3/uL (ref 4.0–10.5)

## 2012-10-18 NOTE — Progress Notes (Signed)
Labs drawn today for cbc/diff

## 2012-10-20 ENCOUNTER — Encounter (HOSPITAL_COMMUNITY): Payer: Self-pay

## 2012-10-20 ENCOUNTER — Encounter (HOSPITAL_BASED_OUTPATIENT_CLINIC_OR_DEPARTMENT_OTHER): Payer: Medicare Other

## 2012-10-20 VITALS — BP 179/75 | HR 55 | Temp 97.6°F | Resp 18 | Wt 194.4 lb

## 2012-10-20 DIAGNOSIS — D696 Thrombocytopenia, unspecified: Secondary | ICD-10-CM

## 2012-10-20 NOTE — Progress Notes (Signed)
[facility] OFFICE PROGRESS NOTE  Glo Herring., MD [address] [place] [address]  DIAGNOSIS: Thrombocytopenia, unspecified  Chief Complaint  Patient presents with  . Abnormal Lab    CURRENT THERAPY: No active treatment for thrombocytopenia at this time  INTERVAL HISTORY: [name] 79 y.o. male returns for followup of chronic ITP. He also suffers from steatohepatitis with splenomegaly and moderate obesity having undergone coronary artery bypass grafting in the past in the setting of hypertension and gastroesophageal reflux disease.  He continues to do well with good appetite and no episodes of epistaxis, melena, hematochezia, hematuria, or significant lower M.D. swelling. He does have chronic back pain and approximately 8 months ago was infected by a tick for which he received appropriate antibiotic therapy from his family physician.  He denies any easy bruising, fever, night sweats, easy satiety, cough, wheezing, headache, or seizures.  MEDICAL HISTORY: Past Medical History  Diagnosis Date  . CAD (coronary artery disease) of artery bypass graft 2006  . Neuropathy   . GERD (gastroesophageal reflux disease)   . HTN (hypertension)   . BPH (benign prostatic hyperplasia)   . Fatty liver   . Schatzki's ring     non critical  . Hiatal hernia     INTERIM HISTORY: has Nausea; GERD (gastroesophageal reflux disease); Diarrhea; Bloating symptom; and Thrombocytopenia, unspecified on his problem list.    ALLERGIES:  is allergic to antihistamines, chlorpheniramine-type.  MEDICATIONS: has a current medication list which includes the following prescription(s): amlodipine, aspirin, doxazosin, flunisolide, gabapentin, lisinopril, loperamide, metoprolol succinate, pantoprazole, alprazolam, flaxseed oil, and hydrochlorothiazide.  SURGICAL HISTORY:  Past Surgical History  Procedure Laterality Date  . Coronary artery bypass graft  2006   placed stent for crossed coronary arteries  . Rotater cuff      repair  . Cataract extraction  2011  . Tonsillectomy    . Esophagogastroduodenoscopy  2006    Dr. Ruthell Rummage Schatzki's ring, s/p 58-F Maloney dilation  . Colonoscopy  2006    Dr. Margarito Courser pancolonic diverticula  . Colonoscopy  01/07/2012    Dr. Abbe Amsterdam normal rectum, pancolonic diverticulosis, hyperplastic polyp  . Esophagogastroduodenoscopy  01/07/2012    Dr. Gareth Morgan ring, hiatal hernia, fundic gland polyp    REVIEW OF SYSTEMS:   Constitutional: Denies fevers, chills or abnormal weight loss Eyes: Denies blurriness of vision Ears, nose, mouth, throat, and face: Denies mucositis or sore throat Respiratory: Denies cough, dyspnea or wheezes Cardiovascular: Denies palpitation, chest discomfort or lower extremity swelling Gastrointestinal:  Denies nausea, heartburn or change in bowel habits Skin: Denies abnormal skin rashes. Tick bite associated with a residual ulcer on his posterior thorax Lymphatics: Denies new lymphadenopathy or easy bruising Neurological:Denies numbness, tingling or new weaknesses Behavioral/Psych: Mood is stable, no new changes Musculoskeletal: Chronic low back pain not worsening  All other systems were reviewed with the patient and are negative.  PHYSICAL EXAMINATION: ECOG PERFORMANCE STATUS: 0 - Asymptomatic  Blood pressure 179/75, pulse 55, temperature 97.6 F (36.4 C), temperature source Oral, resp. rate 18, weight 194 lb 6.4 oz (88.179 kg).  GENERAL:alert, no distress and comfortable SKIN: skin color, texture, turgor are normal, no rashes or significant lesions EYES: normal, Conjunctiva are pink and non-injected, sclera clear OROPHARYNX:no exudate, no erythema and lips, buccal mucosa, and tongue normal  NECK: supple, thyroid normal size, non-tender, without nodularity LYMPH:  no palpable lymphadenopathy in the cervical, axillary or inguinal LUNGS: clear to  auscultation and percussion with normal breathing effort HEART:  regular rate & rhythm and no murmurs and no lower extremity edema ABDOMEN:abdomen soft, non-tender and normal bowel sounds Musculoskeletal:no cyanosis of digits and no clubbing  NEURO: alert & oriented x 3 with fluent speech, no focal motor/sensory deficits   LABORATORY DATA: 10/18/2012 WBC 4.0, hemoglobin 13.4, platelets 112,000. This is in comparison to a platelet count of 150,000 in January of 2014. No results found for this or any previous visit (from the past 48 hour(s)).    RADIOGRAPHIC STUDIES: No results found.  ASSESSMENT: #1. Chronic immune pancytopenia, stable. #2. Recent take bite with appropriate antibiotic therapy for Lyme disease. #3. Coronary artery disease, status post coronary artery bypass, no evidence of heart failure or dysrhythmia. #4. Hypertension, controlled. #5. Hypertension #6 degenerative joint disease, stable. #7. Steatohepatitis with splenomegaly, enlarged spleen not appreciated on physical examination   PLAN: #1. No definitive therapy is recommended. #2 in the presence of his wife, he was advised about what to look for in the future which would indicate a drop in his platelet count. This included any bleeding episode that would not stopped, epistaxis, melena, hematochezia, hematuria, or the appearance of petechiae. Followup is scheduled for one year with CBC one day prior to his next visit.   All questions were answered. The patient knows to call the clinic with any problems, questions or concerns. We can certainly see the patient much sooner if necessary.  The patient and plan discussed with Farrel Gobble A and he is in agreement with the aforementioned.  I spent 25 minutes counseling the patient face to face. The total time spent in the appointment was 20 minutes.    Doroteo Bradford, MD 10/20/2012 2:00 PM

## 2012-10-20 NOTE — Patient Instructions (Signed)
.  [facility] Discharge Instructions  RECOMMENDATIONS MADE BY THE CONSULTANT AND ANY TEST RESULTS WILL BE SENT TO YOUR REFERRING PHYSICIAN.  EXAM FINDINGS BY THE PHYSICIAN TODAY AND SIGNS OR SYMPTOMS TO REPORT TO CLINIC OR PRIMARY PHYSICIAN: Exam and findings as discussed by Dr. Barnet Glasgow.  Report any unusual bruising or bleeding INSTRUCTIONS/FOLLOW-UP:  labs in 1 year then to see the MD.  Thank you for choosing [facility] to provide your oncology and hematology care.  To afford each patient quality time with our providers, please arrive at least 15 minutes before your scheduled appointment time.  With your help, our goal is to use those 15 minutes to complete the necessary work-up to ensure our physicians have the information they need to help with your evaluation and healthcare recommendations.    Effective January 1st, 2014, we ask that you re-schedule your appointment with our physicians should you arrive 10 or more minutes late for your appointment.  We strive to give you quality time with our providers, and arriving late affects you and other patients whose appointments are after yours.    Again, thank you for choosing [facility].  Our hope is that these requests will decrease the amount of time that you wait before being seen by our physicians.       _____________________________________________________________  Should you have questions after your visit to [facility], please contact our office at [phone] between the hours of 8:30 a.m. and 5:00 p.m.  Voicemails left after 4:30 p.m. will not be returned until the following business day.  For prescription refill requests, have your pharmacy contact our office with your prescription refill request.

## 2012-11-15 DIAGNOSIS — Z6829 Body mass index (BMI) 29.0-29.9, adult: Secondary | ICD-10-CM | POA: Diagnosis not present

## 2012-11-15 DIAGNOSIS — M5137 Other intervertebral disc degeneration, lumbosacral region: Secondary | ICD-10-CM | POA: Diagnosis not present

## 2012-11-15 DIAGNOSIS — R7301 Impaired fasting glucose: Secondary | ICD-10-CM | POA: Diagnosis not present

## 2012-11-15 DIAGNOSIS — M545 Low back pain: Secondary | ICD-10-CM | POA: Diagnosis not present

## 2012-11-15 DIAGNOSIS — F411 Generalized anxiety disorder: Secondary | ICD-10-CM | POA: Diagnosis not present

## 2012-12-22 DIAGNOSIS — Z23 Encounter for immunization: Secondary | ICD-10-CM | POA: Diagnosis not present

## 2013-01-05 ENCOUNTER — Encounter: Payer: Self-pay | Admitting: *Deleted

## 2013-01-07 ENCOUNTER — Ambulatory Visit (INDEPENDENT_AMBULATORY_CARE_PROVIDER_SITE_OTHER): Payer: Medicare Other | Admitting: Internal Medicine

## 2013-01-07 ENCOUNTER — Encounter: Payer: Self-pay | Admitting: Internal Medicine

## 2013-01-07 VITALS — BP 130/62 | HR 64 | Ht 69.0 in | Wt 199.9 lb

## 2013-01-07 DIAGNOSIS — I6523 Occlusion and stenosis of bilateral carotid arteries: Secondary | ICD-10-CM

## 2013-01-07 DIAGNOSIS — K219 Gastro-esophageal reflux disease without esophagitis: Secondary | ICD-10-CM | POA: Diagnosis not present

## 2013-01-07 DIAGNOSIS — I6529 Occlusion and stenosis of unspecified carotid artery: Secondary | ICD-10-CM

## 2013-01-07 DIAGNOSIS — Z951 Presence of aortocoronary bypass graft: Secondary | ICD-10-CM | POA: Diagnosis not present

## 2013-01-07 DIAGNOSIS — I10 Essential (primary) hypertension: Secondary | ICD-10-CM

## 2013-01-07 DIAGNOSIS — I2510 Atherosclerotic heart disease of native coronary artery without angina pectoris: Secondary | ICD-10-CM | POA: Diagnosis not present

## 2013-01-07 DIAGNOSIS — D696 Thrombocytopenia, unspecified: Secondary | ICD-10-CM

## 2013-01-07 DIAGNOSIS — I658 Occlusion and stenosis of other precerebral arteries: Secondary | ICD-10-CM

## 2013-01-07 NOTE — Patient Instructions (Signed)
Your physician wants you to follow-up in: 1 year. You will receive a reminder letter in the mail two months in advance. If you don't receive a letter, please call our office to schedule the follow-up appointment.  

## 2013-01-07 NOTE — Progress Notes (Signed)
OFFICE NOTE  Chief Complaint:  Occasional dyspnea, otherwise feels well  Primary Care Physician: Glo Herring., MD  HPI:  [name] is a 79 year old gentleman who had bypass in 2006, a normal nuclear study in 2009 and has done well without any episodes of angina. He has remained active and does exercise several times a week and has had no worsening shortness of breath, palpitations, presyncope or syncopal symptoms. He also has a history of right carotid disease which he was told to be about 60% at the time of bypass, however, recent Dopplers show only mild disease. He does have a very faint bruit. He last saw Tarri Fuller, PA-C, in the office this past summer. He was having problems with elevated blood pressures at night. It was recommended that he change his blood pressure medications around, however he did not make that change. He continues on the lisinopril 10 mg daily and his blood pressure is better controlled today. He occasionally gets short of breath with marked exertion but denies any chest pain.   PMHx:  Past Medical History  Diagnosis Date  . CAD (coronary artery disease) of artery bypass graft 2006  . Neuropathy   . GERD (gastroesophageal reflux disease)   . HTN (hypertension)   . BPH (benign prostatic hyperplasia)   . Fatty liver   . Schatzki's ring     non critical  . Hiatal hernia   . S/P CABG (coronary artery bypass graft) 2006  . PVD (peripheral vascular disease)     0-49% R & L ICA stenosis (2013)   . History of nuclear stress test 11/2007    bruce myoview; normal pattern of persuion in all regions; post-stress EF 70%; low risk     Past Surgical History  Procedure Laterality Date  . Coronary artery bypass graft  04/2004    LIMA to diagonal, SVG to ramus intermedius, SVG to PDA; placed stent for crossed coronary arteries (Dr. Prescott Gum)  . Rotater cuff      repair  . Cataract extraction  2011  . Tonsillectomy    . Esophagogastroduodenoscopy  2006      Dr. Ruthell Rummage Schatzki's ring, s/p 58-F Maloney dilation  . Colonoscopy  2006    Dr. Margarito Courser pancolonic diverticula  . Colonoscopy  01/07/2012    Dr. Abbe Amsterdam normal rectum, pancolonic diverticulosis, hyperplastic polyp  . Esophagogastroduodenoscopy  01/07/2012    Dr. Gareth Morgan ring, hiatal hernia, fundic gland polyp    FAMHx:  Family History  Problem Relation Age of Onset  . Heart attack Mother 66    deceased  . Heart attack Father 57    deceased  . Colon cancer Neg Hx   . Heart disease Brother     x2  . Diabetes Brother     x3    SOCHx:   reports that he quit smoking about 8 years ago. His smoking use included Cigarettes. He has a 40 pack-year smoking history. He has never used smokeless tobacco. He reports that he drinks alcohol. He reports that he does not use illicit drugs.  ALLERGIES:  No Known Allergies  ROS: A comprehensive review of systems was negative except for: Respiratory: positive for dyspnea on exertion  HOME MEDS: Current Outpatient Prescriptions  Medication Sig Dispense Refill  . ALPRAZolam (XANAX) 0.5 MG tablet Take 0.5 mg by mouth at bedtime as needed. Sleep.      Marland Kitchen aspirin 81 MG tablet Take 81 mg by mouth 2 (two) times a week.      Marland Kitchen  doxazosin (CARDURA XL) 8 MG 24 hr tablet Take 4 mg by mouth 2 (two) times daily.       . flunisolide (NASALIDE) 0.025 % SOLN Inhale 2 sprays into the lungs 2 (two) times daily.      Marland Kitchen gabapentin (NEURONTIN) 300 MG capsule Take 300 mg by mouth 3 (three) times daily.        . hydrochlorothiazide 25 MG tablet Take 12.5 mg by mouth daily as needed.       Marland Kitchen lisinopril (PRINIVIL,ZESTRIL) 10 MG tablet Take 10 mg by mouth daily.       . metoprolol succinate (TOPROL-XL) 25 MG 24 hr tablet Take 12.5 mg by mouth 2 (two) times daily.       . pantoprazole (PROTONIX) 40 MG tablet Take 40 mg by mouth daily.         No current facility-administered medications for this visit.    LABS/IMAGING: No results  found for this or any previous visit (from the past 48 hour(s)). No results found.  VITALS: BP 130/62  Pulse 64  Ht 5' 9"  (1.753 m)  Wt 199 lb 14.4 oz (90.674 kg)  BMI 29.51 kg/m2  EXAM: General appearance: alert and no distress Neck: no carotid bruit and no JVD Lungs: clear to auscultation bilaterally Heart: regular rate and rhythm, S1, S2 normal, no murmur, click, rub or gallop Abdomen: soft, non-tender; bowel sounds normal; no masses,  no organomegaly Extremities: extremities normal, atraumatic, no cyanosis or edema Pulses: 2+ and symmetric Skin: Skin color, texture, turgor normal. No rashes or lesions Neurologic: Grossly normal Psych: Mood, affect normal  EKG:  sinus rhythm at 64  ASSESSMENT: 1. Coronary artery disease status post three-vessel CABG in 2006 (LIMA to diagonal, SVG to PDA and SVG to ramus intermedius) 2. Hypertension-controlled 3. Mild carotid artery disease 4. Mild dyspnea with marked exertion  PLAN: 1.   Mr. Mcglocklin is doing fairly well at this point, with only mild shortness of breath with doing heavy activities. Blood pressure is well controlled and he is having no anginal symptoms.  I recommended checking a lipid profile today, but he wants to defer this to his primary care provider at the [facility] and says that he will have the records sent to our office. Plan to see him back annually or sooner as necessary.  Pixie Casino, MD, [facility] Attending Cardiologist [facility]  Otilio Groleau C 01/07/2013, 12:28 PM

## 2013-01-20 DIAGNOSIS — J209 Acute bronchitis, unspecified: Secondary | ICD-10-CM | POA: Diagnosis not present

## 2013-01-20 DIAGNOSIS — Z6829 Body mass index (BMI) 29.0-29.9, adult: Secondary | ICD-10-CM | POA: Diagnosis not present

## 2013-01-20 DIAGNOSIS — J019 Acute sinusitis, unspecified: Secondary | ICD-10-CM | POA: Diagnosis not present

## 2013-03-17 DIAGNOSIS — N400 Enlarged prostate without lower urinary tract symptoms: Secondary | ICD-10-CM | POA: Diagnosis not present

## 2013-03-17 DIAGNOSIS — Z6829 Body mass index (BMI) 29.0-29.9, adult: Secondary | ICD-10-CM | POA: Diagnosis not present

## 2013-03-17 DIAGNOSIS — J309 Allergic rhinitis, unspecified: Secondary | ICD-10-CM | POA: Diagnosis not present

## 2013-03-17 DIAGNOSIS — R350 Frequency of micturition: Secondary | ICD-10-CM | POA: Diagnosis not present

## 2013-03-29 ENCOUNTER — Other Ambulatory Visit: Payer: Self-pay

## 2013-03-29 MED ORDER — METOPROLOL TARTRATE 25 MG PO TABS: 12.5000 mg | ORAL_TABLET | Freq: Two times a day (BID) | ORAL | Status: DC

## 2013-03-29 MED ORDER — LISINOPRIL 10 MG PO TABS: 10.0000 mg | ORAL_TABLET | Freq: Every day | ORAL | Status: DC

## 2013-03-29 NOTE — Telephone Encounter (Signed)
Per Dr Lysbeth Penner last office visit on, 01/07/2013, patient reported Metoprolol Succinate. Received request for Metoprolol Tartrate.  Call to patient to clarify which Metoprolol patient was taking - Metoprolol Tartrate.  Rx was sent to pharmacy electronically.

## 2013-03-30 ENCOUNTER — Telehealth: Payer: Self-pay | Admitting: Internal Medicine

## 2013-03-30 NOTE — Telephone Encounter (Signed)
Per answering service-Blood pressure medicine dosage was wrong. It was called in for 43m and he takes 20 mg.Please call pharmacy for him.

## 2013-03-30 NOTE — Telephone Encounter (Signed)
Returned call to patient. Patient claims he has been taking Lisinopril 36m 1/2 tablet twice daily. Per last office visit, from December with Dr CWells Guiles he was taking Lisinopril 174mQD. Pulled patient's paper chart and the last office note (from 05/12/2012 w/ Dr A Rockne Menghinistated - "Lisinopril 1025m/2 tablet in the morning and 1/2 tablet in the evening." Patient reports he doesn't remember when it was increased, but that it was discussed to increased during an office visit w/ Dr C HWells Guiles

## 2013-04-04 NOTE — Telephone Encounter (Signed)
I had him taking 10 mg daily. If is blood pressure is good and he is taking 20 mg daily, then have him stay on that dose. There is not a big difference between the 2 doses.   Dr. Debara Pickett

## 2013-04-05 MED ORDER — LISINOPRIL 20 MG PO TABS: 10.0000 mg | ORAL_TABLET | Freq: Every day | ORAL | Status: DC

## 2013-04-05 NOTE — Telephone Encounter (Signed)
Returned call to patient.  Patient states, his blood pressure has been doing well - highest systolic 957.  Will send in rx for Lisinopril 93m.

## 2013-04-08 ENCOUNTER — Ambulatory Visit: Payer: Medicare Other | Admitting: Internal Medicine

## 2013-04-12 DIAGNOSIS — J209 Acute bronchitis, unspecified: Secondary | ICD-10-CM | POA: Diagnosis not present

## 2013-04-12 DIAGNOSIS — I2510 Atherosclerotic heart disease of native coronary artery without angina pectoris: Secondary | ICD-10-CM | POA: Diagnosis not present

## 2013-04-12 DIAGNOSIS — J0100 Acute maxillary sinusitis, unspecified: Secondary | ICD-10-CM | POA: Diagnosis not present

## 2013-04-12 DIAGNOSIS — I10 Essential (primary) hypertension: Secondary | ICD-10-CM | POA: Diagnosis not present

## 2013-04-12 DIAGNOSIS — Z6829 Body mass index (BMI) 29.0-29.9, adult: Secondary | ICD-10-CM | POA: Diagnosis not present

## 2013-04-15 ENCOUNTER — Emergency Department (HOSPITAL_COMMUNITY): Payer: Medicare Other

## 2013-04-15 ENCOUNTER — Inpatient Hospital Stay (HOSPITAL_COMMUNITY)
Admission: EM | Admit: 2013-04-15 | Discharge: 2013-04-17 | DRG: 684 | Disposition: A | Discharge status: Home / Self Care | Payer: Medicare Other | Attending: Internal Medicine | Admitting: Internal Medicine

## 2013-04-15 ENCOUNTER — Encounter (HOSPITAL_COMMUNITY): Payer: Self-pay | Admitting: Emergency Medicine

## 2013-04-15 DIAGNOSIS — E860 Dehydration: Secondary | ICD-10-CM | POA: Diagnosis not present

## 2013-04-15 DIAGNOSIS — I2510 Atherosclerotic heart disease of native coronary artery without angina pectoris: Secondary | ICD-10-CM | POA: Diagnosis not present

## 2013-04-15 DIAGNOSIS — Z951 Presence of aortocoronary bypass graft: Secondary | ICD-10-CM

## 2013-04-15 DIAGNOSIS — Z833 Family history of diabetes mellitus: Secondary | ICD-10-CM

## 2013-04-15 DIAGNOSIS — I517 Cardiomegaly: Secondary | ICD-10-CM | POA: Diagnosis not present

## 2013-04-15 DIAGNOSIS — Z87891 Personal history of nicotine dependence: Secondary | ICD-10-CM

## 2013-04-15 DIAGNOSIS — N400 Enlarged prostate without lower urinary tract symptoms: Secondary | ICD-10-CM | POA: Diagnosis present

## 2013-04-15 DIAGNOSIS — K7689 Other specified diseases of liver: Secondary | ICD-10-CM | POA: Diagnosis present

## 2013-04-15 DIAGNOSIS — I4891 Unspecified atrial fibrillation: Secondary | ICD-10-CM | POA: Diagnosis not present

## 2013-04-15 DIAGNOSIS — I959 Hypotension, unspecified: Secondary | ICD-10-CM | POA: Diagnosis present

## 2013-04-15 DIAGNOSIS — I951 Orthostatic hypotension: Secondary | ICD-10-CM | POA: Diagnosis not present

## 2013-04-15 DIAGNOSIS — R5383 Other fatigue: Secondary | ICD-10-CM

## 2013-04-15 DIAGNOSIS — K219 Gastro-esophageal reflux disease without esophagitis: Secondary | ICD-10-CM | POA: Diagnosis present

## 2013-04-15 DIAGNOSIS — I739 Peripheral vascular disease, unspecified: Secondary | ICD-10-CM | POA: Diagnosis present

## 2013-04-15 DIAGNOSIS — N179 Acute kidney failure, unspecified: Secondary | ICD-10-CM | POA: Diagnosis not present

## 2013-04-15 DIAGNOSIS — D696 Thrombocytopenia, unspecified: Secondary | ICD-10-CM | POA: Diagnosis not present

## 2013-04-15 DIAGNOSIS — Z8249 Family history of ischemic heart disease and other diseases of the circulatory system: Secondary | ICD-10-CM

## 2013-04-15 DIAGNOSIS — R197 Diarrhea, unspecified: Secondary | ICD-10-CM

## 2013-04-15 DIAGNOSIS — R5381 Other malaise: Secondary | ICD-10-CM

## 2013-04-15 DIAGNOSIS — R140 Abdominal distension (gaseous): Secondary | ICD-10-CM

## 2013-04-15 DIAGNOSIS — I10 Essential (primary) hypertension: Secondary | ICD-10-CM | POA: Diagnosis present

## 2013-04-15 DIAGNOSIS — I6529 Occlusion and stenosis of unspecified carotid artery: Secondary | ICD-10-CM

## 2013-04-15 DIAGNOSIS — J40 Bronchitis, not specified as acute or chronic: Secondary | ICD-10-CM | POA: Diagnosis present

## 2013-04-15 DIAGNOSIS — R42 Dizziness and giddiness: Secondary | ICD-10-CM | POA: Diagnosis not present

## 2013-04-15 DIAGNOSIS — R531 Weakness: Secondary | ICD-10-CM | POA: Diagnosis present

## 2013-04-15 DIAGNOSIS — R110 Nausea: Secondary | ICD-10-CM

## 2013-04-15 HISTORY — DX: Thrombocytopenia, unspecified: D69.6

## 2013-04-15 LAB — TROPONIN I

## 2013-04-15 LAB — CBC WITH DIFFERENTIAL/PLATELET
BASOS ABS: 0 10*3/uL (ref 0.0–0.1)
BASOS PCT: 0 % (ref 0–1)
Eosinophils Absolute: 0 10*3/uL (ref 0.0–0.7)
Eosinophils Relative: 1 % (ref 0–5)
HEMATOCRIT: 40.2 % (ref 39.0–52.0)
HEMOGLOBIN: 14.2 g/dL (ref 13.0–17.0)
Lymphocytes Relative: 16 % (ref 12–46)
Lymphs Abs: 0.7 10*3/uL (ref 0.7–4.0)
MCH: 34.2 pg — ABNORMAL HIGH (ref 26.0–34.0)
MCHC: 35.3 g/dL (ref 30.0–36.0)
MCV: 96.9 fL (ref 78.0–100.0)
MONO ABS: 0.6 10*3/uL (ref 0.1–1.0)
Monocytes Relative: 13 % — ABNORMAL HIGH (ref 3–12)
NEUTROS ABS: 3.2 10*3/uL (ref 1.7–7.7)
Neutrophils Relative %: 70 % (ref 43–77)
Platelets: 116 10*3/uL — ABNORMAL LOW (ref 150–400)
RBC: 4.15 MIL/uL — ABNORMAL LOW (ref 4.22–5.81)
RDW: 13.2 % (ref 11.5–15.5)
Smear Review: DECREASED
WBC: 4.6 10*3/uL (ref 4.0–10.5)

## 2013-04-15 LAB — URINALYSIS, ROUTINE W REFLEX MICROSCOPIC
Bilirubin Urine: NEGATIVE
GLUCOSE, UA: NEGATIVE mg/dL
Hgb urine dipstick: NEGATIVE
Ketones, ur: NEGATIVE mg/dL
Leukocytes, UA: NEGATIVE
Nitrite: NEGATIVE
PH: 5 (ref 5.0–8.0)
PROTEIN: NEGATIVE mg/dL
Specific Gravity, Urine: >1.03 — ABNORMAL HIGH (ref 1.005–1.030)
Urobilinogen, UA: 0.2 mg/dL (ref 0.0–1.0)

## 2013-04-15 LAB — BASIC METABOLIC PANEL
BUN: 35 mg/dL — ABNORMAL HIGH (ref 6–23)
CHLORIDE: 98 meq/L (ref 96–112)
CO2: 26 mEq/L (ref 19–32)
Calcium: 9.3 mg/dL (ref 8.4–10.5)
Creatinine, Ser: 1.99 mg/dL — ABNORMAL HIGH (ref 0.50–1.35)
GFR, EST AFRICAN AMERICAN: 36 mL/min — AB (ref >90)
GFR, EST NON AFRICAN AMERICAN: 31 mL/min — AB (ref >90)
Glucose, Bld: 115 mg/dL — ABNORMAL HIGH (ref 70–99)
Potassium: 4.2 mEq/L (ref 3.7–5.3)
SODIUM: 138 meq/L (ref 137–147)

## 2013-04-15 LAB — LACTIC ACID, PLASMA: LACTIC ACID, VENOUS: 2.3 mmol/L — AB (ref 0.5–2.2)

## 2013-04-15 MED ORDER — SODIUM CHLORIDE 0.9 % IV BOLUS (SEPSIS)
250.0000 mL | Freq: Once | INTRAVENOUS | Status: AC
  Administered 2013-04-15: 250 mL via INTRAVENOUS

## 2013-04-15 MED ORDER — FLUTICASONE PROPIONATE 50 MCG/ACT NA SUSP
2.0000 | Freq: Every day | NASAL | Status: DC
  Administered 2013-04-15 – 2013-04-16 (×2): 2 via NASAL
  Filled 2013-04-15: qty 16

## 2013-04-15 MED ORDER — FLUNISOLIDE 25 MCG/ACT (0.025%) NA SOLN
2.0000 | Freq: Two times a day (BID) | NASAL | Status: DC
  Filled 2013-04-15: qty 25

## 2013-04-15 MED ORDER — DILTIAZEM HCL 25 MG/5ML IV SOLN
10.0000 mg | Freq: Once | INTRAVENOUS | Status: AC
  Administered 2013-04-15: 10 mg via INTRAVENOUS
  Filled 2013-04-15: qty 5

## 2013-04-15 MED ORDER — GUAIFENESIN-DM 100-10 MG/5ML PO SYRP
5.0000 mL | ORAL_SOLUTION | ORAL | Status: DC | PRN
  Administered 2013-04-15: 5 mL via ORAL
  Filled 2013-04-15: qty 5

## 2013-04-15 MED ORDER — SODIUM CHLORIDE 0.9 % IV SOLN: INTRAVENOUS | Status: AC

## 2013-04-15 MED ORDER — SODIUM CHLORIDE 0.9 % IV SOLN
INTRAVENOUS | Status: DC
  Administered 2013-04-15 – 2013-04-17 (×4): via INTRAVENOUS

## 2013-04-15 MED ORDER — ONDANSETRON HCL 4 MG PO TABS
4.0000 mg | ORAL_TABLET | Freq: Four times a day (QID) | ORAL | Status: DC | PRN
Start: 2013-04-15 — End: 2013-04-17

## 2013-04-15 MED ORDER — METOPROLOL TARTRATE 25 MG PO TABS
25.0000 mg | ORAL_TABLET | Freq: Two times a day (BID) | ORAL | Status: DC
  Administered 2013-04-15 – 2013-04-17 (×4): 25 mg via ORAL
  Filled 2013-04-15 (×5): qty 1

## 2013-04-15 MED ORDER — ONDANSETRON HCL 4 MG/2ML IJ SOLN
4.0000 mg | Freq: Four times a day (QID) | INTRAMUSCULAR | Status: DC | PRN
Start: 2013-04-15 — End: 2013-04-17

## 2013-04-15 MED ORDER — SODIUM CHLORIDE 0.9 % IJ SOLN
3.0000 mL | Freq: Two times a day (BID) | INTRAMUSCULAR | Status: DC
  Administered 2013-04-17 (×2): 3 mL via INTRAVENOUS

## 2013-04-15 MED ORDER — SODIUM CHLORIDE 0.9 % IV BOLUS (SEPSIS): 250.0000 mL | Freq: Once | INTRAVENOUS | Status: DC

## 2013-04-15 MED ORDER — SODIUM CHLORIDE 0.9 % IV SOLN: INTRAVENOUS | Status: DC

## 2013-04-15 MED ORDER — ALPRAZOLAM 0.5 MG PO TABS
0.5000 mg | ORAL_TABLET | Freq: Every evening | ORAL | Status: DC | PRN
  Administered 2013-04-16: 0.5 mg via ORAL
  Filled 2013-04-15: qty 1

## 2013-04-15 MED ORDER — ACETAMINOPHEN 650 MG RE SUPP: 650.0000 mg | Freq: Four times a day (QID) | RECTAL | Status: DC | PRN

## 2013-04-15 MED ORDER — GABAPENTIN 300 MG PO CAPS
300.0000 mg | ORAL_CAPSULE | Freq: Three times a day (TID) | ORAL | Status: DC
  Administered 2013-04-15 – 2013-04-17 (×6): 300 mg via ORAL
  Filled 2013-04-15 (×6): qty 1

## 2013-04-15 MED ORDER — DILTIAZEM HCL 25 MG/5ML IV SOLN
10.0000 mg | Freq: Four times a day (QID) | INTRAVENOUS | Status: DC | PRN
  Administered 2013-04-16: 10 mg via INTRAVENOUS
  Filled 2013-04-15: qty 5

## 2013-04-15 MED ORDER — ACETAMINOPHEN 325 MG PO TABS: 650.0000 mg | ORAL_TABLET | Freq: Four times a day (QID) | ORAL | Status: DC | PRN

## 2013-04-15 MED ORDER — PANTOPRAZOLE SODIUM 40 MG PO TBEC
40.0000 mg | DELAYED_RELEASE_TABLET | Freq: Every day | ORAL | Status: DC
  Administered 2013-04-15 – 2013-04-17 (×3): 40 mg via ORAL
  Filled 2013-04-15 (×3): qty 1

## 2013-04-15 MED ORDER — HEPARIN SODIUM (PORCINE) 5000 UNIT/ML IJ SOLN
5000.0000 [IU] | Freq: Three times a day (TID) | INTRAMUSCULAR | Status: DC
  Filled 2013-04-15: qty 1

## 2013-04-15 NOTE — Progress Notes (Signed)
Patient noted to be in rapid Afib prior to being transferred to floor. EKG showed Afib with RVR @ 125 beats / min. No ST-T changes  No hx of Afib. BP stable on lying down( 118/70 mhg). ordered 10 mg IV Cardizem before transferring to telemetry.

## 2013-04-15 NOTE — H&P (Addendum)
[facility] History and Physical  [name] YPP:[id number] DOB: 05-03-1933 DOA: 04/15/2013  Referring physician: Dr Elise Benne PCP: Glo Herring., MD   Chief Complaint:  DIZZINESS X 1 DAY   HPI:  80 Y/o male with hx of CAD s/p CABG, HTN, BPH, GERD  Who has bronchitis like symptoms for 1 week and and started levaquin by PCP 4 days back presented to ED with symptoms of dizziness and weakness since this am. He reports feeling dizzy on getting up with sensation of room spinning and feels like falling down. He denies headache , chest pain, palpitations or syncope. Reports having chills and being sweaty last night.  Patient denies  fever,  nausea , vomiting, chest pain, palpitations, SOB, abdominal pain, bowel or urinary symptoms. Denies change in weight but has poor appetite for past week. Denies any any medication except levaquin. Mild thrombocytopenia and AKI with BUN creatinine of 35 and 1.99 respectively. Troponin was negative. CXR and UA was unremarkable. patient given NS 250 cc x2 and [facility] called for admission to telemetry.   Course in ED Patient was hypotensive to 70s/ 40 on getting up and felt dizzy. He was afebrile. Blood work showed  Review of Systems:  Constitutional: Chills+, diaphoresis +, poor appetite, and fatigue Denies fever,  .  HEENT: cough , chest congestion, Denies photophobia, eye pain, redness, hearing loss, ear pain, sore throat, rhinorrhea, sneezing, mouth sores, trouble swallowing, neck pain, neck stiffness  Respiratory: cough, Denies SOB, DOE, chest tightness,  and wheezing.  Cardiovascular: Denies chest pain, palpitations and leg swelling.  Gastrointestinal: Denies nausea, vomiting, abdominal pain, diarrhea, constipation, blood in stool and abdominal distention.  Genitourinary: Denies dysuria, urgency, frequency, hematuria, flank pain and difficulty urinating.  Endocrine: sweats+, Denies hot or cold intolerance,   polyuria,  polydipsia. Musculoskeletal: Denies myalgias, back pain, joint swelling, arthralgias and gait problem.  Skin: Denies pallor, rash and wound.  Neurological: Denies dizziness, weakness, lightheadedness, denies seizures, syncope, weakness,and headaches.  Psychiatric/Behavioral: Denies  confusion, nervousness, sleep disturbance    Past Medical History  Diagnosis Date  . CAD (coronary artery disease) of artery bypass graft 2006  . Neuropathy   . GERD (gastroesophageal reflux disease)   . HTN (hypertension)   . BPH (benign prostatic hyperplasia)   . Fatty liver   . Schatzki's ring     non critical  . Hiatal hernia   . S/P CABG (coronary artery bypass graft) 2006  . PVD (peripheral vascular disease)     0-49% R & L ICA stenosis (2013)   . History of nuclear stress test 11/2007    bruce myoview; normal pattern of persuion in all regions; post-stress EF 70%; low risk   . Gilbert's syndrome    Past Surgical History  Procedure Laterality Date  . Coronary artery bypass graft  04/2004    LIMA to diagonal, SVG to ramus intermedius, SVG to PDA; placed stent for crossed coronary arteries (Dr. Prescott Gum)  . Rotater cuff      repair  . Cataract extraction  2011  . Tonsillectomy    . Esophagogastroduodenoscopy  2006    Dr. Ruthell Rummage Schatzki's ring, s/p 58-F Maloney dilation  . Colonoscopy  2006    Dr. Margarito Courser pancolonic diverticula  . Colonoscopy  01/07/2012    Dr. Abbe Amsterdam normal rectum, pancolonic diverticulosis, hyperplastic polyp  . Esophagogastroduodenoscopy  01/07/2012    Dr. Gareth Morgan ring, hiatal hernia, fundic gland polyp   Social History:  reports that he quit smoking about 9 years  ago. His smoking use included Cigarettes. He has a 40 pack-year smoking history. He has never used smokeless tobacco. He reports that he drinks alcohol. He reports that he does not use illicit drugs.  No Known Allergies  Family History  Problem Relation Age of Onset  .  Heart attack Mother 27    deceased  . Heart attack Father 12    deceased  . Colon cancer Neg Hx   . Heart disease Brother     x2  . Diabetes Brother     x3    Prior to Admission medications   Medication Sig Start Date End Date Taking? Authorizing Provider  ALPRAZolam Duanne Moron) 0.5 MG tablet Take 0.5 mg by mouth at bedtime as needed. Sleep.   Yes Historical Provider, MD  aspirin 81 MG tablet Take 81 mg by mouth 2 (two) times a week.   Yes Historical Provider, MD  doxazosin (CARDURA XL) 8 MG 24 hr tablet Take 4 mg by mouth 2 (two) times daily.    Yes Historical Provider, MD  flunisolide (NASALIDE) 0.025 % SOLN Inhale 2 sprays into the lungs 2 (two) times daily.   Yes Historical Provider, MD  gabapentin (NEURONTIN) 300 MG capsule Take 300 mg by mouth 3 (three) times daily.     Yes Historical Provider, MD  hydrochlorothiazide 25 MG tablet Take 12.5 mg by mouth daily as needed. Blood pressure/fluid   Yes Historical Provider, MD  lisinopril (PRINIVIL,ZESTRIL) 20 MG tablet Take 0.5 tablets (10 mg total) by mouth daily. 04/05/13  Yes Pixie Casino, MD  metoprolol tartrate (LOPRESSOR) 25 MG tablet Take 0.5 tablets (12.5 mg total) by mouth 2 (two) times daily. 03/29/13  Yes Pixie Casino, MD  pantoprazole (PROTONIX) 40 MG tablet Take 40 mg by mouth daily as needed. Acid reflux   Yes Historical Provider, MD     Physical Exam:  Filed Vitals:   04/15/13 1251 04/15/13 1336 04/15/13 1337 04/15/13 1340  BP: 93/40 111/55 79/64 74/56   Pulse: 84  98 98  Temp: 98.1 F (36.7 C)     TempSrc: Oral     Resp: 20     Height: 5' 9"  (1.753 m)     Weight: 88.451 kg (195 lb)     SpO2: 95% 97%      Constitutional: Vital signs reviewed.  Patient is an elderly mael in NAD HEENT: no pallor, no icterus, moist oral mucosa, no cervical lymphadenopathy Cardiovascular: RRR, S1 normal, S2 normal, no MRG Chest: CTAB, no wheezes, rales, or rhonchi Abdominal: Soft. Non-tender, non-distended, bowel sounds are  normal, no masses, organomegaly, or guarding present.  Ext: warm, no edema Neurological: A&O x3, non focal  Labs on Admission:  Basic Metabolic Panel:  Recent Labs Lab 04/15/13 1328  NA 138  K 4.2  CL 98  CO2 26  GLUCOSE 115*  BUN 35*  CREATININE 1.99*  CALCIUM 9.3   Liver Function Tests: No results found for this basename: AST, ALT, ALKPHOS, BILITOT, PROT, ALBUMIN,  in the last 168 hours No results found for this basename: LIPASE, AMYLASE,  in the last 168 hours No results found for this basename: AMMONIA,  in the last 168 hours CBC:  Recent Labs Lab 04/15/13 1328  WBC 4.6  NEUTROABS 3.2  HGB 14.2  HCT 40.2  MCV 96.9  PLT 116*   Cardiac Enzymes:  Recent Labs Lab 04/15/13 1328  TROPONINI <0.30   BNP: No components found with this basename: POCBNP,  CBG: No results found for  this basename: GLUCAP,  in the last 168 hours  Radiological Exams on Admission: Dg Chest Port 1 View  04/15/2013   CLINICAL DATA:  Weakness  EXAM: PORTABLE CHEST - 1 VIEW  COMPARISON:  Jul 02, 2012  FINDINGS: The heart size and mediastinal contours are stable. Patient is status post prior median sternotomy. Both lungs are clear. The visualized skeletal structures are unremarkable.  IMPRESSION: No active cardiopulmonary disease.   Electronically Signed   By: Abelardo Diesel M.D.   On: 04/15/2013 13:28    EKG: pending  Assessment/Plan   Principal Problem:   Postural hypotension Possible etiologies include dehydration, medications and ? Infection. Admit to tele under observation.  IV hydration with NS@125  cc/ hr. Given 500 cc NS in ED. -CXR and UA unremarkable. initial troponin negative. EKG pending. Ordered blood cx. Check TSH and am cortisol. Has mildly elevated lactate possibly from dehydration.  -hold BP meds and doxazosin -monitor BP closely  Active Problems:   Acute kidney injury Possibly prerenal from dehydration. Check urine lytes and TSH Hold lisinopril, HCTZ and  doxazosin    GERD (gastroesophageal reflux disease) Continue PPI    CAD (coronary artery disease), s/p CABG Hold metoprolol given low BP     Weakness, generalized Possibly with dehydration and recent bronchitis like symptom.   Bronchitis  reports symptoms fo chest congestion and cough improving. Was on levaquin for past 4 days prescribed by PCP. CXR wnl. Will hold abx for now and monitor  Thrombocytopenia  appears chronuic  Diet:cardiac  DVT prophylaxis: SCD   Code Status: full code Family Communication: wife at bedside Disposition Plan: home once improved  Rasheida Broden, [place] [facility] Pager [phone]  Total time spent on admission :50 minutes  If 7PM-7AM, please contact night-coverage [email or web address] Password [email or web address] 04/15/2013, 2:47 PM

## 2013-04-15 NOTE — ED Notes (Signed)
Pt states that he "feels weak as usual" while sitting and standing up. Pt has orthostatic blood pressure drop.  Gave pt urinal to void while standing

## 2013-04-15 NOTE — ED Notes (Signed)
Await bed change to tele per edp orders

## 2013-04-15 NOTE — ED Provider Notes (Signed)
CSN: [id number]     Arrival date & time 04/15/13  1238 History   First MD Initiated Contact with Patient 04/15/13 1314     Chief Complaint  Patient presents with  . Weakness      HPI Pt was seen at 1330.  Per pt, c/o gradual onset and worsening of persistent generalized weakness/fatigue for the past 1 week. Has been associated with cough and decreased PO intake.  Pt states he was evaluated by his PMD 3 days ago for same, rx levaquin for presumed pneumonia. Pt states he "still doesn't feel well." Denies fevers, no CP/palpitations, no SOB, no abd pain, no N/V/D, no back pain, no syncope, no focal motor weakness, no tingling/numbness in extremities.    Past Medical History  Diagnosis Date  . CAD (coronary artery disease) of artery bypass graft 2006  . Neuropathy   . GERD (gastroesophageal reflux disease)   . HTN (hypertension)   . BPH (benign prostatic hyperplasia)   . Fatty liver   . Schatzki's ring     non critical  . Hiatal hernia   . S/P CABG (coronary artery bypass graft) 2006  . PVD (peripheral vascular disease)     0-49% R & L ICA stenosis (2013)   . History of nuclear stress test 11/2007    bruce myoview; normal pattern of persuion in all regions; post-stress EF 70%; low risk   . Gilbert's syndrome   . Thrombocytopenia    Past Surgical History  Procedure Laterality Date  . Coronary artery bypass graft  04/2004    LIMA to diagonal, SVG to ramus intermedius, SVG to PDA; placed stent for crossed coronary arteries (Dr. Prescott Gum)  . Rotater cuff      repair  . Cataract extraction  2011  . Tonsillectomy    . Esophagogastroduodenoscopy  2006    Dr. Ruthell Rummage Schatzki's ring, s/p 58-F Maloney dilation  . Colonoscopy  2006    Dr. Margarito Courser pancolonic diverticula  . Colonoscopy  01/07/2012    Dr. Abbe Amsterdam normal rectum, pancolonic diverticulosis, hyperplastic polyp  . Esophagogastroduodenoscopy  01/07/2012    Dr. Gareth Morgan ring, hiatal hernia, fundic  gland polyp   Family History  Problem Relation Age of Onset  . Heart attack Mother 48    deceased  . Heart attack Father 15    deceased  . Colon cancer Neg Hx   . Heart disease Brother     x2  . Diabetes Brother     x3   History  Substance Use Topics  . Smoking status: Former Smoker -- 1.00 packs/day for 40 years    Types: Cigarettes    Quit date: 04/07/2004  . Smokeless tobacco: Never Used  . Alcohol Use: Yes     Comment: occassional drinker    Review of Systems ROS: Statement: All systems negative except as marked or noted in the HPI; Constitutional: Negative for fever and chills. +generalized weakness/fatigue, decreased PO intake. ; ; Eyes: Negative for eye pain, redness and discharge. ; ; ENMT: Negative for ear pain, hoarseness, nasal congestion, sinus pressure and sore throat. ; ; Cardiovascular: Negative for chest pain, palpitations, diaphoresis, dyspnea and peripheral edema. ; ; Respiratory: +cough. Negative for wheezing and stridor. ; ; Gastrointestinal: Negative for nausea, vomiting, diarrhea, abdominal pain, blood in stool, hematemesis, jaundice and rectal bleeding. . ; ; Genitourinary: Negative for dysuria, flank pain and hematuria. ; ; Musculoskeletal: Negative for back pain and neck pain. Negative for swelling and trauma.; ; Skin: Negative for pruritus,  rash, abrasions, blisters, bruising and skin lesion.; ; Neuro: Negative for headache, lightheadedness and neck stiffness. Negative for altered level of consciousness , altered mental status, extremity weakness, paresthesias, involuntary movement, seizure and syncope.        Allergies  Review of patient's allergies indicates no known allergies.  Home Medications   Current Outpatient Rx  Name  Route  Sig  Dispense  Refill  . ALPRAZolam (XANAX) 0.5 MG tablet   Oral   Take 0.5 mg by mouth at bedtime as needed. Sleep.         Marland Kitchen aspirin 81 MG tablet   Oral   Take 81 mg by mouth 2 (two) times a week.         .  doxazosin (CARDURA XL) 8 MG 24 hr tablet   Oral   Take 4 mg by mouth 2 (two) times daily.          . flunisolide (NASALIDE) 0.025 % SOLN   Inhalation   Inhale 2 sprays into the lungs 2 (two) times daily.         Marland Kitchen gabapentin (NEURONTIN) 300 MG capsule   Oral   Take 300 mg by mouth 3 (three) times daily.           . hydrochlorothiazide 25 MG tablet   Oral   Take 12.5 mg by mouth daily as needed. Blood pressure/fluid         . lisinopril (PRINIVIL,ZESTRIL) 20 MG tablet   Oral   Take 0.5 tablets (10 mg total) by mouth daily.   90 tablet   3   . metoprolol tartrate (LOPRESSOR) 25 MG tablet   Oral   Take 0.5 tablets (12.5 mg total) by mouth 2 (two) times daily.   90 tablet   3   . pantoprazole (PROTONIX) 40 MG tablet   Oral   Take 40 mg by mouth daily as needed. Acid reflux          BP 74/56  Pulse 98  Temp(Src) 98.1 F (36.7 C) (Oral)  Resp 20  Ht 5' 9"  (1.753 m)  Wt 195 lb (88.451 kg)  BMI 28.78 kg/m2  SpO2 97% Filed Vitals:   04/15/13 1251 04/15/13 1336 04/15/13 1337 04/15/13 1340  BP: 93/40 111/55 79/64 74/56   Pulse: 84  98 98  Temp: 98.1 F (36.7 C)     TempSrc: Oral     Resp: 20     Height: 5' 9"  (1.753 m)     Weight: 195 lb (88.451 kg)     SpO2: 95% 97%      Physical Exam 1335: Physical examination:  Nursing notes reviewed; Vital signs and O2 SAT reviewed;  Constitutional: Well developed, Well nourished, In no acute distress; Head:  Normocephalic, atraumatic; Eyes: EOMI, PERRL, No scleral icterus; ENMT: Mouth and pharynx normal, Mucous membranes dry; Neck: Supple, Full range of motion, No lymphadenopathy; Cardiovascular: Regular rate and rhythm, No gallop; Respiratory: Breath sounds coarse & equal bilaterally, No wheezes.  Speaking full sentences with ease, Normal respiratory effort/excursion; Chest: Nontender, Movement normal; Abdomen: Soft, Nontender, Nondistended, Normal bowel sounds; Genitourinary: No CVA tenderness; Extremities: Pulses normal,  No tenderness, No edema, No calf edema or asymmetry.; Neuro: AA&Ox3, Major CN grossly intact. No facial droop. Speech clear. No gross focal motor or sensory deficits in extremities.; Skin: Color normal, Warm, Dry. No rash.   ED Course  Procedures   1425:  Pt orthostatic with BP dropping to 74/56 from 111/55 and pt c/o generalized weakness.  Denies CP/SOB. Judicious IVF given with slow improvement in BP to 118/51.  Thrombocytopenic per hx. Remains afebrile. BUN/Cr elevated and pt appears clinically dehydrated; will continue IVF, admit.  Dx and testing d/w pt and family.  Questions answered.  Verb understanding, agreeable to observation admit. T/C to [facility] Dr. Clementeen Graham, case discussed, including:  HPI, pertinent PM/SHx, VS/PE, dx testing, ED course and treatment:  Agreeable to observation admit, requests to write temporary orders, obtain medical bed to team 2.     EKG Interpretation None      MDM  MDM Reviewed: previous chart, nursing note and vitals Reviewed previous: labs and ECG Interpretation: labs, ECG and x-ray Total time providing critical care: 30-74 minutes. This excludes time spent performing separately reportable procedures and services. Consults: admitting MD   CRITICAL CARE Performed by: Alfonzo Feller Total critical care time: 35 Critical care time was exclusive of separately billable procedures and treating other patients. Critical care was necessary to treat or prevent imminent or life-threatening deterioration. Critical care was time spent personally by me on the following activities: development of treatment plan with patient and/or surrogate as well as nursing, discussions with consultants, evaluation of patient's response to treatment, examination of patient, obtaining history from patient or surrogate, ordering and performing treatments and interventions, ordering and review of laboratory studies, ordering and review of radiographic studies, pulse oximetry and  re-evaluation of patient's condition.   Date: 04/15/2013  Rate: 87  Rhythm: normal sinus rhythm  QRS Axis: left  Intervals: normal  ST/T Wave abnormalities: nonspecific ST/T changes lateral leads  Conduction Disutrbances:none  Narrative Interpretation:   Old EKG Reviewed: changes noted; NS STTW changes lateral leads new since previous EKG dated 05/20/2012.    Results for orders placed during the hospital encounter of 04/15/13  URINALYSIS, ROUTINE W REFLEX MICROSCOPIC      Result Value Ref Range   Color, Urine YELLOW  YELLOW   APPearance CLEAR  CLEAR   Specific Gravity, Urine >1.030 (*) 1.005 - 1.030   pH 5.0  5.0 - 8.0   Glucose, UA NEGATIVE  NEGATIVE mg/dL   Hgb urine dipstick NEGATIVE  NEGATIVE   Bilirubin Urine NEGATIVE  NEGATIVE   Ketones, ur NEGATIVE  NEGATIVE mg/dL   Protein, ur NEGATIVE  NEGATIVE mg/dL   Urobilinogen, UA 0.2  0.0 - 1.0 mg/dL   Nitrite NEGATIVE  NEGATIVE   Leukocytes, UA NEGATIVE  NEGATIVE  CBC WITH DIFFERENTIAL      Result Value Ref Range   WBC 4.6  4.0 - 10.5 K/uL   RBC 4.15 (*) 4.22 - 5.81 MIL/uL   Hemoglobin 14.2  13.0 - 17.0 g/dL   HCT 40.2  39.0 - 52.0 %   MCV 96.9  78.0 - 100.0 fL   MCH 34.2 (*) 26.0 - 34.0 pg   MCHC 35.3  30.0 - 36.0 g/dL   RDW 13.2  11.5 - 15.5 %   Platelets 116 (*) 150 - 400 K/uL   Neutrophils Relative % 70  43 - 77 %   Neutro Abs 3.2  1.7 - 7.7 K/uL   Lymphocytes Relative 16  12 - 46 %   Lymphs Abs 0.7  0.7 - 4.0 K/uL   Monocytes Relative 13 (*) 3 - 12 %   Monocytes Absolute 0.6  0.1 - 1.0 K/uL   Eosinophils Relative 1  0 - 5 %   Eosinophils Absolute 0.0  0.0 - 0.7 K/uL   Basophils Relative 0  0 - 1 %  Basophils Absolute 0.0  0.0 - 0.1 K/uL   Smear Review PLATELETS APPEAR DECREASED    BASIC METABOLIC PANEL      Result Value Ref Range   Sodium 138  137 - 147 mEq/L   Potassium 4.2  3.7 - 5.3 mEq/L   Chloride 98  96 - 112 mEq/L   CO2 26  19 - 32 mEq/L   Glucose, Bld 115 (*) 70 - 99 mg/dL   BUN 35 (*) 6 - 23  mg/dL   Creatinine, Ser 1.99 (*) 0.50 - 1.35 mg/dL   Calcium 9.3  8.4 - 10.5 mg/dL   GFR calc non Af Amer 31 (*) >90 mL/min   GFR calc Af Amer 36 (*) >90 mL/min  LACTIC ACID, PLASMA      Result Value Ref Range   Lactic Acid, Venous 2.3 (*) 0.5 - 2.2 mmol/L  TROPONIN I      Result Value Ref Range   Troponin I <0.30  <0.30 ng/mL   Dg Chest Port 1 View 04/15/2013   CLINICAL DATA:  Weakness  EXAM: PORTABLE CHEST - 1 VIEW  COMPARISON:  Jul 02, 2012  FINDINGS: The heart size and mediastinal contours are stable. Patient is status post prior median sternotomy. Both lungs are clear. The visualized skeletal structures are unremarkable.  IMPRESSION: No active cardiopulmonary disease.   Electronically Signed   By: Abelardo Diesel M.D.   On: 04/15/2013 13:28    Results for DUEY, LILLER (MRN [id number]) as of 04/15/2013 15:18  Ref. Range 11/07/2007 05:25 08/26/2011 16:50 04/15/2013 13:28  BUN Latest Range: 6-23 mg/dL 16 15 35 (H)  Creatinine Latest Range: 0.50-1.35 mg/dL 1.27 1.24 1.99 (H)   Results for CASTOR, GITTLEMAN (MRN [id number]) as of 04/15/2013 15:18  Ref. Range 11/07/2007 05:25 08/26/2011 16:50 02/13/2012 13:05 10/18/2012 09:49 04/15/2013 13:28  Platelets Latest Range: 150-400 K/uL 114 (L) 136 (L) 150 112 (L) 116 (L)    Alfonzo Feller, DO 04/16/13 1303

## 2013-04-15 NOTE — ED Notes (Signed)
Call to [phone] for report on pt

## 2013-04-15 NOTE — ED Notes (Signed)
When checking pt VS I noted that pt was in irregular tachy rhytm.  Pt placed on monitor, noted Afib.  EKG done which shows A-fib.  Admitting MD was paged.  Await further orders

## 2013-04-15 NOTE — ED Notes (Signed)
Says he feels weak  Seen by Dr Gerarda Fraction on  3/10 and given levaquin.   Cough , fever

## 2013-04-16 DIAGNOSIS — I2510 Atherosclerotic heart disease of native coronary artery without angina pectoris: Secondary | ICD-10-CM

## 2013-04-16 DIAGNOSIS — I4891 Unspecified atrial fibrillation: Secondary | ICD-10-CM

## 2013-04-16 DIAGNOSIS — I951 Orthostatic hypotension: Secondary | ICD-10-CM

## 2013-04-16 DIAGNOSIS — I517 Cardiomegaly: Secondary | ICD-10-CM

## 2013-04-16 LAB — BASIC METABOLIC PANEL
BUN: 23 mg/dL (ref 6–23)
CHLORIDE: 104 meq/L (ref 96–112)
CO2: 19 mEq/L (ref 19–32)
Calcium: 8.3 mg/dL — ABNORMAL LOW (ref 8.4–10.5)
Creatinine, Ser: 1.12 mg/dL (ref 0.50–1.35)
GFR calc non Af Amer: 61 mL/min — ABNORMAL LOW (ref >90)
GFR, EST AFRICAN AMERICAN: 71 mL/min — AB (ref >90)
Glucose, Bld: 101 mg/dL — ABNORMAL HIGH (ref 70–99)
Potassium: 4.4 mEq/L (ref 3.7–5.3)
Sodium: 138 mEq/L (ref 137–147)

## 2013-04-16 LAB — CORTISOL
CORTISOL PLASMA: 10.9 ug/dL
Cortisol, Plasma: 4 ug/dL

## 2013-04-16 LAB — TROPONIN I: Troponin I: <0.3 ng/mL (ref <0.30)

## 2013-04-16 LAB — TSH: TSH: 1.327 u[IU]/mL (ref 0.350–4.500)

## 2013-04-16 MED ORDER — APIXABAN 5 MG PO TABS
5.0000 mg | ORAL_TABLET | Freq: Two times a day (BID) | ORAL | Status: DC
  Administered 2013-04-16 – 2013-04-17 (×2): 5 mg via ORAL
  Filled 2013-04-16 (×3): qty 1

## 2013-04-16 MED ORDER — ALBUTEROL SULFATE (2.5 MG/3ML) 0.083% IN NEBU
2.5000 mg | INHALATION_SOLUTION | RESPIRATORY_TRACT | Status: DC | PRN
  Administered 2013-04-16: 2.5 mg via RESPIRATORY_TRACT
  Filled 2013-04-16: qty 3

## 2013-04-16 MED ORDER — DILTIAZEM HCL 100 MG IV SOLR
5.0000 mg/h | INTRAVENOUS | Status: DC
  Filled 2013-04-16: qty 100

## 2013-04-16 MED ORDER — ASPIRIN 81 MG PO CHEW
81.0000 mg | CHEWABLE_TABLET | Freq: Every day | ORAL | Status: DC
  Administered 2013-04-16: 81 mg via ORAL
  Filled 2013-04-16: qty 1

## 2013-04-16 MED ORDER — DILTIAZEM HCL 30 MG PO TABS
30.0000 mg | ORAL_TABLET | Freq: Four times a day (QID) | ORAL | Status: DC
  Administered 2013-04-16 – 2013-04-17 (×4): 30 mg via ORAL
  Filled 2013-04-16 (×4): qty 1

## 2013-04-16 MED ORDER — GUAIFENESIN ER 600 MG PO TB12
600.0000 mg | ORAL_TABLET | Freq: Two times a day (BID) | ORAL | Status: DC | PRN
  Administered 2013-04-16: 600 mg via ORAL
  Filled 2013-04-16: qty 1

## 2013-04-16 NOTE — Progress Notes (Signed)
  Echocardiogram 2D Echocardiogram has been performed.  [name] 04/16/2013, 11:34 AM

## 2013-04-16 NOTE — Progress Notes (Signed)
Patient heart rate elevated above 120 sustained, rhythm Afib. Orders to give Cardizem as needed for heart rate above 120. Cardizem 38m IV given at 9:05am. In the meantime Dr. DClementeen Grahamin to assess patient. New orders for Cardizem drip and to transfer patient to ICU. Patient converted back to normal sinus rhythm with IV push of Cardizem. Dr. DClementeen Grahamnotified. New orders to cancel transfer and Cardizem drip. Patient started on PO Cardizem. Patient continues to be in normal sinus rhythm. Will continue to monitor.

## 2013-04-16 NOTE — Progress Notes (Signed)
ANTICOAGULATION CONSULT NOTE - Initial Consult  Pharmacy Consult for Apixaban Indication: atrial fibrillation  No Known Allergies  Patient Measurements: Height: 5' 9"  (175.3 cm) Weight: 193 lb (87.544 kg) IBW/kg (Calculated) : 70.7  Vital Signs: Temp: 98.3 F (36.8 C) (03/14 1500) Temp src: Oral (03/14 1500) BP: 128/67 mmHg (03/14 1500) Pulse Rate: 54 (03/14 1500)  Labs:  Recent Labs  04/15/13 1328 04/15/13 1927 04/16/13 0212 04/16/13 0813  HGB 14.2  --   --   --   HCT 40.2  --   --   --   PLT 116*  --   --   --   CREATININE 1.99*  --   --  1.12  TROPONINI <0.30 <0.30 <0.30 <0.30    Estimated Creatinine Clearance: 60.5 ml/min (by C-G formula based on Cr of 1.12).   Medical History: Past Medical History  Diagnosis Date  . CAD (coronary artery disease) of artery bypass graft 2006  . Neuropathy   . GERD (gastroesophageal reflux disease)   . BPH (benign prostatic hyperplasia)   . Fatty liver   . Schatzki's ring     non critical  . Hiatal hernia   . S/P CABG (coronary artery bypass graft) 2006  . PVD (peripheral vascular disease)     0-49% R & L ICA stenosis (2013)   . History of nuclear stress test 11/2007    bruce myoview; normal pattern of persuion in all regions; post-stress EF 70%; low risk   . Gilbert's syndrome   . Thrombocytopenia   . HTN (hypertension)     Medications:  Scheduled:  . sodium chloride   Intravenous STAT  . apixaban  5 mg Oral BID  . diltiazem  30 mg Oral 4 times per day  . fluticasone  2 spray Each Nare Daily  . gabapentin  300 mg Oral TID  . metoprolol tartrate  25 mg Oral BID  . pantoprazole  40 mg Oral Daily  . sodium chloride  250 mL Intravenous Once  . sodium chloride  3 mL Intravenous Q12H    Assessment: Okay for Protocol, 80 yo male to be treated with apixaban for Afib   Goal of Therapy:  Stroke Prevention   Plan:  Apixaban 17m PO BID  HPricilla Larsson3/14/2015,4:03 PM

## 2013-04-16 NOTE — Progress Notes (Signed)
[facility] PROGRESS NOTE  [name] WER:[id number] DOB: 1933-09-01 DOA: 04/15/2013 PCP: Glo Herring., MD Brief narrative 80 Y/o male with hx of CAD s/p CABG, HTN, BPH, GERD who had bronchitis like symptoms for 1 week and and started levaquin by PCP 4 days back presented to ED with symptoms of dizziness and weakness since the morning of admission. Patient found to be hypotensive with blood pressure dropping down to 70s/50s upon standing in the ED. Blood pressure improved after 500 cc normal saline bolus. He also had acute kidney injury and shortly developed a rapid A. fib.    Assessment/Plan: New onset A. fib with RVR Symptoms developed while in the ED. Patient given IV Cardizem  when necessary this does control the heart rate. Since blood pressure was stable his home dose of metoprolol was resumed and started on oral Cardizem 30 mg every 6 hours this morning. -TSH within normal limit. 2D echo pending. -His CHADS2 score is 2 ( LVEF not yet known). Patient reports that he has gastric upset with her regular dose of baby aspirin. Due to this he would benefit from being on anticoagulation for his A. fib. Patient agrees to be on anticoagulation if needed. -This and follows with Dr. Debara Pickett as outpatient  Principal Problem:  Postural hypotension  Possibly related to dehydration with blood pressure improved after IV fluids.  -CXR and UA unremarkable. Serial  troponin negative.TSH and random cortisol normal. Blood cultures so far no growth. Had mildly elevated lactate possibly from dehydration.    Active Problems:  Acute kidney injury  Possibly prerenal from dehydration. TSH normal. Hold lisinopril, HCTZ and doxazosin. Renal function improved to normal this morning.  GERD (gastroesophageal reflux disease)  Continue PPI   CAD (coronary artery disease), s/p CABG  Continue metoprolol and statin. Continue baby aspirin  Weakness, generalized  Possibly with dehydration and recent  bronchitis like symptom.   Bronchitis  reports symptoms of chest congestion and cough. Was on levaquin for past 4 days prescribed by PCP. CXR wnl. Will hold abx for now and monitor  Patient has a rhonchi on exam. I would add when necessary albuterol nebs  Thrombocytopenia  Tonic and stable  Diet:cardiac  DVT prophylaxis: SCD   Code Status: Full code Family Communication: Wife at bedside Disposition Plan: Home once improved   Consultants:  Cardiology consult on Monday  Procedures:  NONE  Antibiotics:  none  HPI/Subjective: Patient reports feeling better and not dizzy anymore. Still   Objective: Filed Vitals:   04/16/13 0515  BP: 128/73  Pulse: 117  Temp: 98.8 F (37.1 C)  Resp: 18    Intake/Output Summary (Last 24 hours) at 04/16/13 1224 Last data filed at 04/16/13 0845  Gross per 24 hour  Intake   3365 ml  Output      3 ml  Net   3362 ml   Filed Weights   04/15/13 1251 04/15/13 1650  Weight: 88.451 kg (195 lb) 87.544 kg (193 lb)    Exam:   General:  Elderly male in NAD  HEENT: no pallor, moist mucosa  Chest: Equal air entry bilaterally, scattered rhonchi anteriorly  Cardiovascular: S1&S2 irregularly irregular, no murmurs, rubs or gallop  Abdomen: soft, NT, ND, BS+  Ext: warm, no edema  CNS: AAOX3   Data Reviewed: Basic Metabolic Panel:  Recent Labs Lab 04/15/13 1328 04/16/13 0813  NA 138 138  K 4.2 4.4  CL 98 104  CO2 26 19  GLUCOSE 115* 101*  BUN 35* 23  CREATININE 1.99* 1.12  CALCIUM 9.3 8.3*   Liver Function Tests: No results found for this basename: AST, ALT, ALKPHOS, BILITOT, PROT, ALBUMIN,  in the last 168 hours No results found for this basename: LIPASE, AMYLASE,  in the last 168 hours No results found for this basename: AMMONIA,  in the last 168 hours CBC:  Recent Labs Lab 04/15/13 1328  WBC 4.6  NEUTROABS 3.2  HGB 14.2  HCT 40.2  MCV 96.9  PLT 116*   Cardiac Enzymes:  Recent Labs Lab 04/15/13 1328  04/15/13 1927 04/16/13 0212 04/16/13 0813  TROPONINI <0.30 <0.30 <0.30 <0.30   BNP (last 3 results) No results found for this basename: PROBNP,  in the last 8760 hours CBG: No results found for this basename: GLUCAP,  in the last 168 hours  Recent Results (from the past 240 hour(s))  CULTURE, BLOOD (ROUTINE X 2)     Status: None   Collection Time    04/15/13  2:45 PM      Result Value Ref Range Status   Specimen Description BLOOD LEFT HAND   Final   Special Requests BOTTLES DRAWN AEROBIC AND ANAEROBIC [place]   Final   Culture NO GROWTH 1 DAY   Final   Report Status PENDING   Incomplete  CULTURE, BLOOD (ROUTINE X 2)     Status: None   Collection Time    04/15/13  3:10 PM      Result Value Ref Range Status   Specimen Description BLOOD LEFT HAND   Final   Special Requests BOTTLES DRAWN AEROBIC AND ANAEROBIC 8CC   Final   Culture NO GROWTH 1 DAY   Final   Report Status PENDING   Incomplete     Studies: Dg Chest Port 1 View  04/15/2013   CLINICAL DATA:  Weakness  EXAM: PORTABLE CHEST - 1 VIEW  COMPARISON:  Jul 02, 2012  FINDINGS: The heart size and mediastinal contours are stable. Patient is status post prior median sternotomy. Both lungs are clear. The visualized skeletal structures are unremarkable.  IMPRESSION: No active cardiopulmonary disease.   Electronically Signed   By: Abelardo Diesel M.D.   On: 04/15/2013 13:28    Scheduled Meds: . sodium chloride   Intravenous STAT  . aspirin  81 mg Oral Daily  . diltiazem  30 mg Oral 4 times per day  . fluticasone  2 spray Each Nare Daily  . gabapentin  300 mg Oral TID  . metoprolol tartrate  25 mg Oral BID  . pantoprazole  40 mg Oral Daily  . sodium chloride  250 mL Intravenous Once  . sodium chloride  3 mL Intravenous Q12H   Continuous Infusions: . sodium chloride 75 mL/hr at 04/16/13 0931      Time spent: 25 minutes    Jazzmine Kleiman, [place]  [facility] Pager [phone]. If 7PM-7AM, please contact night-coverage at  [email or web address], password [facility] 04/16/2013, 12:24 PM  LOS: 1 day

## 2013-04-16 NOTE — Progress Notes (Signed)
UR completed 

## 2013-04-17 DIAGNOSIS — I10 Essential (primary) hypertension: Secondary | ICD-10-CM

## 2013-04-17 MED ORDER — AMLODIPINE BESYLATE 5 MG PO TABS: 5.0000 mg | ORAL_TABLET | Freq: Every day | ORAL | Status: DC

## 2013-04-17 MED ORDER — APIXABAN 5 MG PO TABS: 5.0000 mg | ORAL_TABLET | Freq: Two times a day (BID) | ORAL | Status: DC

## 2013-04-17 MED ORDER — DOXAZOSIN MESYLATE ER 8 MG PO TB24: 4.0000 mg | ORAL_TABLET | Freq: Every day | ORAL | Status: DC

## 2013-04-17 MED ORDER — METOPROLOL TARTRATE 25 MG PO TABS: 25.0000 mg | ORAL_TABLET | Freq: Two times a day (BID) | ORAL | Status: DC

## 2013-04-17 MED ORDER — AMLODIPINE BESYLATE 5 MG PO TABS
5.0000 mg | ORAL_TABLET | Freq: Every day | ORAL | Status: DC
  Administered 2013-04-17: 5 mg via ORAL
  Filled 2013-04-17: qty 1

## 2013-04-17 NOTE — Discharge Planning (Signed)
Pt stated he was ready to go home and he had no pain.  Pt IV and tele were removed and pt was given DC papers.  Pt educated and with family in room about future s/sx of low BP that may cause him to call doctor or return to the hospital.  Pt given scripts and told of future suggested FU appts.  Pt will be wheeled to car by PCT and family when ready.

## 2013-04-17 NOTE — Discharge Summary (Addendum)
Physician Discharge Summary  [name] HFW:[id number] DOB: 1934-01-05 DOA: 04/15/2013  PCP: Glo Herring., MD  Admit date: 04/15/2013 Discharge date: 04/17/2013  Time spent: >35 minutes  Recommendations for Outpatient Follow-up:  F/u with cardiology in 1 week F/u with PCP in 1-2 weeks Discharge Diagnoses:  Principal Problem:   Postural hypotension Active Problems:   GERD (gastroesophageal reflux disease)   CAD (coronary artery disease)   S/P CABG x 3   HTN (hypertension)   Weakness generalized   Acute kidney injury   Hypotension   Atrial fibrillation with RVR   Hypotension, postural   Discharge Condition: stable   Diet recommendation: heart helthyu  Filed Weights   04/15/13 1251 04/15/13 1650  Weight: 88.451 kg (195 lb) 87.544 kg (193 lb)    History of present illness:  80 Y/o male with hx of CAD s/p CABG, HTN, BPH, GERD who had bronchitis like symptoms for 1 week and and started levaquin by PCP 4 days back presented to ED with symptoms of dizziness and weakness since the morning of admission. Patient found to be hypotensive with blood pressure dropping down to 70s/50s upon standing in the ED. Blood pressure improved after 500 cc normal saline bolus. He also had acute kidney injury and shortly developed a rapid A. fib.   Hospital Course:  1. New onset A. fib with RVR TSH within normal limit. 2D echo: LVEF 55%; trop negative; no chest pain  -His CHADS2 score is 2 ( LVEF not yet known). Patient reports that he has gastric upset with her regular dose of baby aspirin. Due to this he would benefit from being on anticoagulation for his A. fib. Patient agrees to be on anticoagulation, started apixaban  -HR controlled on BB; f/u with Dr. Debara Pickett as outpatient in 1 week 2. Postural hypotension likely due to dehydration +cardura.  -CXR and UA unremarkable. Serial troponin negative.TSH and random cortisol normal. Blood cultures so far no growth.  -resolved with IVF; recommended  to take cardura at night .  3. Acute kidney injury  Possibly prerenal from dehydration. TSH normal. Hold lisinopril, HCTZ and doxazosin. Renal function improved to normal this morning.  4. GERD (gastroesophageal reflux disease)  Continue PPI  5. Bronchitis  reports symptoms of chest congestion and cough. Was on levaquin for past 4 days prescribed by PCP. CXR wnl. Will hold abx for now and monitor     Procedures:  echo (i.e. Studies not automatically included, echos, thoracentesis, etc; not x-rays)  Consultations:  None   Discharge Exam: Filed Vitals:   04/17/13 0909  BP: 167/62  Pulse:   Temp:   Resp:     General: alert Cardiovascular: s1,s2 rrr Respiratory: CTA BL  Discharge Instructions  Discharge Orders   Future Appointments Provider Department Dept Phone   10/17/2013 9:20 AM [place] [phone]   10/19/2013 1:30 PM [facility] Covering Provider [place] [phone]   Future Orders Complete By Expires   Diet - low sodium heart healthy  As directed    Discharge instructions  As directed    Comments:     Please follow up with PCP in 1-2 weeks Please follow up with cardiology in 1 week   Increase activity slowly  As directed        Medication List    STOP taking these medications       aspirin 81 MG tablet     hydrochlorothiazide 25 MG tablet  Commonly known as:  HYDRODIURIL  lisinopril 20 MG tablet  Commonly known as:  PRINIVIL,ZESTRIL      TAKE these medications       ALPRAZolam 0.5 MG tablet  Commonly known as:  XANAX  Take 0.5 mg by mouth at bedtime as needed. Sleep.     amLODipine 5 MG tablet  Commonly known as:  NORVASC  Take 1 tablet (5 mg total) by mouth daily.     apixaban 5 MG Tabs tablet  Commonly known as:  ELIQUIS  Take 1 tablet (5 mg total) by mouth 2 (two) times daily.     doxazosin 8 MG 24 hr tablet  Commonly known as:  CARDURA XL  Take 1 tablet (8 mg total) by mouth at bedtime.      flunisolide 25 MCG/ACT (0.025%) Soln  Commonly known as:  NASALIDE  Inhale 2 sprays into the lungs 2 (two) times daily.     gabapentin 300 MG capsule  Commonly known as:  NEURONTIN  Take 300 mg by mouth 3 (three) times daily.     metoprolol tartrate 25 MG tablet  Commonly known as:  LOPRESSOR  Take 1 tablet (25 mg total) by mouth 2 (two) times daily.     pantoprazole 40 MG tablet  Commonly known as:  PROTONIX  Take 40 mg by mouth daily as needed. Acid reflux       No Known Allergies     Follow-up Information   Follow up with Glo Herring., MD In 1 week.   Specialty:  Internal Medicine   Contact information:   [address] [place] [address] [phone]       Follow up with HILTY,Kenneth C, MD. Schedule an appointment as soon as possible for a visit in 1 week.   Specialty:  Cardiology   Contact information:   [place] [address] [phone]        The results of significant diagnostics from this hospitalization (including imaging, microbiology, ancillary and laboratory) are listed below for reference.    Significant Diagnostic Studies: Dg Chest Port 1 View  04/15/2013   CLINICAL DATA:  Weakness  EXAM: PORTABLE CHEST - 1 VIEW  COMPARISON:  Jul 02, 2012  FINDINGS: The heart size and mediastinal contours are stable. Patient is status post prior median sternotomy. Both lungs are clear. The visualized skeletal structures are unremarkable.  IMPRESSION: No active cardiopulmonary disease.   Electronically Signed   By: Abelardo Diesel M.D.   On: 04/15/2013 13:28    Microbiology: Recent Results (from the past 240 hour(s))  CULTURE, BLOOD (ROUTINE X 2)     Status: None   Collection Time    04/15/13  2:45 PM      Result Value Ref Range Status   Specimen Description BLOOD LEFT HAND   Final   Special Requests BOTTLES DRAWN AEROBIC AND ANAEROBIC 8CC   Final   Culture NO GROWTH 2 DAYS   Final   Report Status  PENDING   Incomplete  CULTURE, BLOOD (ROUTINE X 2)     Status: None   Collection Time    04/15/13  3:10 PM      Result Value Ref Range Status   Specimen Description BLOOD LEFT HAND   Final   Special Requests BOTTLES DRAWN AEROBIC AND ANAEROBIC 8CC   Final   Culture NO GROWTH 2 DAYS   Final   Report Status PENDING   Incomplete     Labs: Basic Metabolic Panel:  Recent Labs  Lab 04/15/13 1328 04/16/13 0813  NA 138 138  K 4.2 4.4  CL 98 104  CO2 26 19  GLUCOSE 115* 101*  BUN 35* 23  CREATININE 1.99* 1.12  CALCIUM 9.3 8.3*   Liver Function Tests: No results found for this basename: AST, ALT, ALKPHOS, BILITOT, PROT, ALBUMIN,  in the last 168 hours No results found for this basename: LIPASE, AMYLASE,  in the last 168 hours No results found for this basename: AMMONIA,  in the last 168 hours CBC:  Recent Labs Lab 04/15/13 1328  WBC 4.6  NEUTROABS 3.2  HGB 14.2  HCT 40.2  MCV 96.9  PLT 116*   Cardiac Enzymes:  Recent Labs Lab 04/15/13 1328 04/15/13 1927 04/16/13 0212 04/16/13 0813  TROPONINI <0.30 <0.30 <0.30 <0.30   BNP: BNP (last 3 results) No results found for this basename: PROBNP,  in the last 8760 hours CBG: No results found for this basename: GLUCAP,  in the last 168 hours     Signed:  Kinnie Feil  [facility] 04/17/2013, 9:55 AM

## 2013-04-18 ENCOUNTER — Telehealth: Payer: Self-pay | Admitting: *Deleted

## 2013-04-18 NOTE — Telephone Encounter (Signed)
This has been scheduled for you!

## 2013-04-18 NOTE — Telephone Encounter (Signed)
Pt was released from the hospital and needs an appointment with Dr. Debara Pickett. I don't have anything until April and he stated that it needs to be within the next few days to next week. Pt would like a call back  [facility]

## 2013-04-18 NOTE — Telephone Encounter (Signed)
Returned call to patient. Is willing to see PA. Would like to schedule for Thurs 3/19 with Lurena Joiner, as Dr. Debara Pickett is in the office then. Please call to schedule. Thanks a ton!

## 2013-04-18 NOTE — Telephone Encounter (Signed)
Thank you Otila Kluver!

## 2013-04-20 LAB — CULTURE, BLOOD (ROUTINE X 2)
Culture: NO GROWTH
Culture: NO GROWTH

## 2013-04-21 ENCOUNTER — Ambulatory Visit (INDEPENDENT_AMBULATORY_CARE_PROVIDER_SITE_OTHER): Payer: Medicare Other | Admitting: Cardiology

## 2013-04-21 ENCOUNTER — Encounter: Payer: Self-pay | Admitting: Cardiology

## 2013-04-21 VITALS — BP 162/68 | HR 56 | Ht 69.0 in | Wt 194.0 lb

## 2013-04-21 DIAGNOSIS — I2510 Atherosclerotic heart disease of native coronary artery without angina pectoris: Secondary | ICD-10-CM | POA: Diagnosis not present

## 2013-04-21 DIAGNOSIS — I4891 Unspecified atrial fibrillation: Secondary | ICD-10-CM

## 2013-04-21 DIAGNOSIS — N179 Acute kidney failure, unspecified: Secondary | ICD-10-CM | POA: Diagnosis not present

## 2013-04-21 DIAGNOSIS — Z951 Presence of aortocoronary bypass graft: Secondary | ICD-10-CM

## 2013-04-21 NOTE — Assessment & Plan Note (Signed)
No angina 

## 2013-04-21 NOTE — Progress Notes (Signed)
04/21/2013 [name]   April 15, 1933  [id number]  Primary Physicia Glo Herring., MD Primary Cardiologist: Dr Debara Pickett  HPI:  80 y/o followed by Dr Debara Pickett with a history of CABG x 3 in '06, low risk Myoview '09, PAF, and HTN. He was recently admitted through the ER with bronchitis and dehydration. He was noted to be in rapid AF. The pt was unaware of his rhythm. Echo showed preserved LVF. He was placed on Eliquis and his Lopressor was increased to 25 mg BID. He was in NSR at discharge. He is seen in the office today for follow up. He has done well since discharge, no tachycardia, unusual fatigue, dyspnea, or near syncope. In the office today his HR is 52, SB.    Current Outpatient Prescriptions  Medication Sig Dispense Refill  . ALPRAZolam (XANAX) 0.5 MG tablet Take 0.5 mg by mouth at bedtime as needed. Sleep.      Marland Kitchen amLODipine (NORVASC) 5 MG tablet Take 1 tablet (5 mg total) by mouth daily.  30 tablet  1  . apixaban (ELIQUIS) 5 MG TABS tablet Take 1 tablet (5 mg total) by mouth 2 (two) times daily.  60 tablet  1  . doxazosin (CARDURA XL) 8 MG 24 hr tablet Take 1 tablet (8 mg total) by mouth at bedtime.      . flunisolide (NASALIDE) 0.025 % SOLN Inhale 2 sprays into the lungs 2 (two) times daily.      Marland Kitchen gabapentin (NEURONTIN) 300 MG capsule Take 300 mg by mouth 3 (three) times daily.        . metoprolol tartrate (LOPRESSOR) 25 MG tablet Take 1 tablet (25 mg total) by mouth 2 (two) times daily.  90 tablet  3  . pantoprazole (PROTONIX) 40 MG tablet Take 40 mg by mouth daily as needed. Acid reflux       No current facility-administered medications for this visit.    No Known Allergies  History   Social History  . Marital Status: Married    Spouse Name: N/A    Number of Children: N/A  . Years of Education: N/A   Occupational History  . Not on file.   Social History Main Topics  . Smoking status: Former Smoker -- 1.00 packs/day for 40 years    Types: Cigarettes    Quit date:  04/07/2004  . Smokeless tobacco: Never Used  . Alcohol Use: Yes     Comment: occassional drinker  . Drug Use: No  . Sexual Activity: No   Other Topics Concern  . Not on file   Social History Narrative  . No narrative on file     Review of Systems: General: negative for chills, fever, night sweats or weight changes.  Cardiovascular: negative for chest pain, dyspnea on exertion, edema, orthopnea, palpitations, paroxysmal nocturnal dyspnea or shortness of breath Dermatological: negative for rash Respiratory: negative for cough or wheezing Urologic: negative for hematuria Abdominal: negative for nausea, vomiting, diarrhea, bright red blood per rectum, melena, or hematemesis Neurologic: negative for visual changes, syncope, or dizziness All other systems reviewed and are otherwise negative except as noted above.    Blood pressure 162/68, pulse 56, height 5' 9"  (1.753 m), weight 194 lb (87.998 kg).  General appearance: alert, cooperative and no distress Lungs: clear to auscultation bilaterally Heart: regular rate and rhythm  EKG NSR, SB  ASSESSMENT AND PLAN:   Atrial fibrillation with RVR Noted 04/15/13 when admitted with bronchitis and dehydration.  Acute kidney injury Resolved  S/P CABG x 3 No angina   PLAN  Continue current Rx. He says he can't afford Eliquis- that stuff is 300 dollars". I did discuss the need for anticoagulation with the pt and his wife. I provided Eliquis samples and a discount card. I would like him to see [name] our pharmacist for further options. It may be that Coumadin is the best for him long term. He'll see Dr Debara Pickett in a few weeks. I left his Metoprolol dose alone, he'll call us if he has any near syncope.    [facility] 04/21/2013 12:56 PM

## 2013-04-21 NOTE — Assessment & Plan Note (Signed)
Resolved

## 2013-04-21 NOTE — Patient Instructions (Signed)
See Nehemiah Massed our pharmacist in follow up.

## 2013-04-21 NOTE — Assessment & Plan Note (Signed)
Noted 04/15/13 when admitted with bronchitis and dehydration.

## 2013-04-26 DIAGNOSIS — Z6828 Body mass index (BMI) 28.0-28.9, adult: Secondary | ICD-10-CM | POA: Diagnosis not present

## 2013-04-26 DIAGNOSIS — I10 Essential (primary) hypertension: Secondary | ICD-10-CM | POA: Diagnosis not present

## 2013-04-26 DIAGNOSIS — E119 Type 2 diabetes mellitus without complications: Secondary | ICD-10-CM | POA: Diagnosis not present

## 2013-04-26 DIAGNOSIS — I2510 Atherosclerotic heart disease of native coronary artery without angina pectoris: Secondary | ICD-10-CM | POA: Diagnosis not present

## 2013-05-04 ENCOUNTER — Encounter: Payer: Self-pay | Admitting: Pharmacist Clinician (PhC)/ Clinical Pharmacy Specialist

## 2013-05-04 ENCOUNTER — Ambulatory Visit (INDEPENDENT_AMBULATORY_CARE_PROVIDER_SITE_OTHER): Payer: Medicare Other | Admitting: Pharmacist Clinician (PhC)/ Clinical Pharmacy Specialist

## 2013-05-04 DIAGNOSIS — I2510 Atherosclerotic heart disease of native coronary artery without angina pectoris: Secondary | ICD-10-CM

## 2013-05-04 DIAGNOSIS — I4891 Unspecified atrial fibrillation: Secondary | ICD-10-CM

## 2013-05-04 MED ORDER — RIVAROXABAN 20 MG PO TABS: 20.0000 mg | ORAL_TABLET | Freq: Every day | ORAL | Status: DC

## 2013-05-04 NOTE — Progress Notes (Signed)
Pt was started on Xarelto for atrial fibrillation today Wednesday April 1.  He still has about 7-10 days of Eliquis, however the [place] will not cover.  Explained how to switch after last dose of Eliquis.   Reviewed patients medication list.  Pt is not currently on any combined P-gp and strong CYP3A4 inhibitors/inducers (ketoconazole, traconazole, ritonavir, carbamazepine, phenytoin, rifampin, St. John's wort).  Reviewed labs.  SCr 1.12, Weight 194 lbs, CrCl- 69.  Dose appropriate based on CrCl.   Hgb and HCT Within Normal Limits  A full discussion of the nature of anticoagulants has been carried out.  A benefit/risk analysis has been presented to the patient, so that they understand the justification for choosing anticoagulation with Xarelto at this time.  The need for compliance is stressed.  Pt is aware to take the medication once daily with the largest meal of the day.  Side effects of potential bleeding are discussed, including unusual colored urine or stools, coughing up blood or coffee ground emesis, nose bleeds or serious fall or head trauma.  Discussed signs and symptoms of stroke. The patient should avoid any OTC items containing aspirin or ibuprofen.  Avoid alcohol consumption.   Call if any signs of abnormal bleeding.  Discussed financial obligations and resolved any difficulty in obtaining medication.  Next lab test test in 6 months.   Pt was given a written prescription plus card for free 30 day supply.  He is going to try using the manufacturer copay card (free for 1 year), as he does not have a [place] drug plan.  If that does not work he will attempt to get covered by the [place] in [place].  If none of these options work out, he will then be willing to switch to warfarin and have monitored thru our Clinic in [place].  He will call within 2 weeks of running out of meds if we need to switch to warfarin.

## 2013-05-09 ENCOUNTER — Telehealth: Payer: Self-pay | Admitting: Internal Medicine

## 2013-05-09 NOTE — Telephone Encounter (Signed)
Returned call and pt verified x 2.  Pt c/o having night sweats and having to change clothes in the middle of the night.  Stated he is taking Eliquis and has lost some weight.  Pt also c/o a "hurting in my back" x "a week or so" and a cough x "forever."  Took Tylenol w/o relief.  Stated the pain is worse when he moves around.    Reviewed meds listed and unable to find SEs that correspond.  Pt thinks symptoms r/t Eliquis and informed RN unable to find information to support that.  Offered an appt for evaluation w/ PA as Dr. Debara Pickett is not available.  Pt declined and stated he will call his Primary b/c they are just as good as seeing a PA.  Pt advised to ask PCP about a referral to a lung doctor since he stated he was seen several times over the winter for the cough and a cause has not been determined.  Pt verbalized understanding and agreed w/ plan.  Pt will contact PCP for evaluation.

## 2013-05-09 NOTE — Telephone Encounter (Signed)
Please call-having hurting in his back and having night sweats.

## 2013-05-10 ENCOUNTER — Ambulatory Visit (HOSPITAL_COMMUNITY)
Admission: RE | Admit: 2013-05-10 | Discharge: 2013-05-10 | Disposition: A | Discharge status: Home / Self Care | Payer: Medicare Other | Source: Ambulatory Visit | Attending: Internal Medicine | Admitting: Internal Medicine

## 2013-05-10 ENCOUNTER — Other Ambulatory Visit (HOSPITAL_COMMUNITY): Payer: Self-pay | Admitting: Internal Medicine

## 2013-05-10 DIAGNOSIS — R918 Other nonspecific abnormal finding of lung field: Secondary | ICD-10-CM | POA: Diagnosis not present

## 2013-05-10 DIAGNOSIS — R05 Cough: Secondary | ICD-10-CM | POA: Diagnosis not present

## 2013-05-10 DIAGNOSIS — D649 Anemia, unspecified: Secondary | ICD-10-CM | POA: Diagnosis not present

## 2013-05-10 DIAGNOSIS — R059 Cough, unspecified: Secondary | ICD-10-CM | POA: Diagnosis not present

## 2013-05-10 DIAGNOSIS — J984 Other disorders of lung: Secondary | ICD-10-CM | POA: Diagnosis not present

## 2013-05-10 DIAGNOSIS — Z79899 Other long term (current) drug therapy: Secondary | ICD-10-CM | POA: Diagnosis not present

## 2013-05-10 DIAGNOSIS — M549 Dorsalgia, unspecified: Secondary | ICD-10-CM | POA: Diagnosis not present

## 2013-05-10 DIAGNOSIS — R509 Fever, unspecified: Secondary | ICD-10-CM | POA: Diagnosis not present

## 2013-05-10 DIAGNOSIS — N400 Enlarged prostate without lower urinary tract symptoms: Secondary | ICD-10-CM | POA: Diagnosis not present

## 2013-05-10 DIAGNOSIS — I10 Essential (primary) hypertension: Secondary | ICD-10-CM | POA: Diagnosis not present

## 2013-05-10 DIAGNOSIS — Z125 Encounter for screening for malignant neoplasm of prostate: Secondary | ICD-10-CM | POA: Diagnosis not present

## 2013-05-10 DIAGNOSIS — Z6828 Body mass index (BMI) 28.0-28.9, adult: Secondary | ICD-10-CM | POA: Diagnosis not present

## 2013-05-11 DIAGNOSIS — D649 Anemia, unspecified: Secondary | ICD-10-CM | POA: Diagnosis not present

## 2013-05-12 ENCOUNTER — Other Ambulatory Visit (HOSPITAL_COMMUNITY): Payer: Self-pay | Admitting: Internal Medicine

## 2013-05-12 DIAGNOSIS — R911 Solitary pulmonary nodule: Secondary | ICD-10-CM

## 2013-05-12 DIAGNOSIS — R9389 Abnormal findings on diagnostic imaging of other specified body structures: Secondary | ICD-10-CM

## 2013-05-13 ENCOUNTER — Other Ambulatory Visit: Payer: Self-pay | Admitting: *Deleted

## 2013-05-13 DIAGNOSIS — R079 Chest pain, unspecified: Secondary | ICD-10-CM

## 2013-05-16 ENCOUNTER — Ambulatory Visit (HOSPITAL_COMMUNITY)
Admission: RE | Admit: 2013-05-16 | Discharge: 2013-05-16 | Disposition: A | Discharge status: Home / Self Care | Payer: Medicare Other | Source: Ambulatory Visit | Attending: Internal Medicine | Admitting: Internal Medicine

## 2013-05-16 DIAGNOSIS — Z951 Presence of aortocoronary bypass graft: Secondary | ICD-10-CM | POA: Insufficient documentation

## 2013-05-16 DIAGNOSIS — R911 Solitary pulmonary nodule: Secondary | ICD-10-CM

## 2013-05-16 DIAGNOSIS — J984 Other disorders of lung: Secondary | ICD-10-CM | POA: Diagnosis not present

## 2013-05-16 DIAGNOSIS — R9389 Abnormal findings on diagnostic imaging of other specified body structures: Secondary | ICD-10-CM

## 2013-05-16 DIAGNOSIS — R918 Other nonspecific abnormal finding of lung field: Secondary | ICD-10-CM | POA: Insufficient documentation

## 2013-05-17 DIAGNOSIS — Z6828 Body mass index (BMI) 28.0-28.9, adult: Secondary | ICD-10-CM | POA: Diagnosis not present

## 2013-05-19 DIAGNOSIS — E119 Type 2 diabetes mellitus without complications: Secondary | ICD-10-CM | POA: Diagnosis not present

## 2013-05-19 DIAGNOSIS — H35359 Cystoid macular degeneration, unspecified eye: Secondary | ICD-10-CM | POA: Diagnosis not present

## 2013-05-24 ENCOUNTER — Telehealth: Payer: Self-pay | Admitting: Internal Medicine

## 2013-05-24 ENCOUNTER — Ambulatory Visit: Payer: Medicare Other | Admitting: Internal Medicine

## 2013-05-24 ENCOUNTER — Encounter: Payer: Self-pay | Admitting: Internal Medicine

## 2013-05-24 NOTE — Telephone Encounter (Signed)
Mailed letter °

## 2013-05-24 NOTE — Telephone Encounter (Signed)
Pt was a no show

## 2013-06-02 ENCOUNTER — Ambulatory Visit (INDEPENDENT_AMBULATORY_CARE_PROVIDER_SITE_OTHER): Payer: Medicare Other | Admitting: Internal Medicine

## 2013-06-02 ENCOUNTER — Telehealth: Payer: Self-pay | Admitting: Internal Medicine

## 2013-06-02 ENCOUNTER — Encounter: Payer: Self-pay | Admitting: Internal Medicine

## 2013-06-02 VITALS — BP 164/68 | HR 55 | Ht 69.0 in | Wt 197.2 lb

## 2013-06-02 DIAGNOSIS — I951 Orthostatic hypotension: Secondary | ICD-10-CM

## 2013-06-02 DIAGNOSIS — I6529 Occlusion and stenosis of unspecified carotid artery: Secondary | ICD-10-CM

## 2013-06-02 DIAGNOSIS — I2510 Atherosclerotic heart disease of native coronary artery without angina pectoris: Secondary | ICD-10-CM

## 2013-06-02 DIAGNOSIS — I10 Essential (primary) hypertension: Secondary | ICD-10-CM

## 2013-06-02 DIAGNOSIS — I25810 Atherosclerosis of coronary artery bypass graft(s) without angina pectoris: Secondary | ICD-10-CM

## 2013-06-02 DIAGNOSIS — I4891 Unspecified atrial fibrillation: Secondary | ICD-10-CM | POA: Diagnosis not present

## 2013-06-02 DIAGNOSIS — Z951 Presence of aortocoronary bypass graft: Secondary | ICD-10-CM

## 2013-06-02 MED ORDER — AMLODIPINE BESYLATE 5 MG PO TABS: 5.0000 mg | ORAL_TABLET | Freq: Every day | ORAL | Status: DC

## 2013-06-02 NOTE — Patient Instructions (Signed)
Your physician has recommended you make the following change in your medication: START NORVASC 10m ONCE DAILY  Your physician wants you to follow-up in: 6 months. You will receive a reminder letter in the mail two months in advance. If you don't receive a letter, please call our office to schedule the follow-up appointment.

## 2013-06-02 NOTE — Telephone Encounter (Signed)
Returned call and pt verified x 2 w/ Vaughan Basta, pt's wife.  Informed message received and Eliezer Lofts, Dr. Lysbeth Penner nurse, will be notified as it sounds like pt needs PA for the medication.  Informed Eliezer Lofts will be back in contact with an update.  Message forwarded to [place], Therapist, sports.

## 2013-06-02 NOTE — Telephone Encounter (Signed)
Calling because the pharmacy will not fill the Xarelto. Was seen in the office today by Dr.Hilty  please call    Thanks

## 2013-06-02 NOTE — Progress Notes (Signed)
OFFICE NOTE  Chief Complaint:  Occasional dyspnea, otherwise feels well  Primary Care Physician: Glo Herring., MD  HPI:  [name] is a 80 year old gentleman who had bypass in 2006, a normal nuclear study in 2009 and has done well without any episodes of angina. He has remained active and does exercise several times a week and has had no worsening shortness of breath, palpitations, presyncope or syncopal symptoms. He also has a history of right carotid disease which he was told to be about 60% at the time of bypass, however, recent Dopplers show only mild disease. He does have a very faint bruit. He last saw Tarri Fuller, PA-C, in the office this past summer. He was having problems with elevated blood pressures at night. It was recommended that he change his blood pressure medications around, however he did not make that change.  He was recently admitted to [place] for dehydration in the setting of pneumonia or viral influenza. He was taking his diuretics in addition to ongoing fluid losses. This cause orthostatic hypotension. His diuretic was held and his lisinopril was stopped due to worsening renal function. He was switched to amlodipine for better blood pressure control, but has not been taking the medicine due to confusion of his medicines. Currently reports his blood pressures have improved and is not have any significant swelling. In fact he is now hypertensive again. He is recently switched from warfarin over to Xarelto for better control of anticoagulation for his A. fib. He seems to be able to get the medication now with out undue cost.  PMHx:  Past Medical History  Diagnosis Date  . CAD (coronary artery disease) of artery bypass graft 2006  . Neuropathy   . GERD (gastroesophageal reflux disease)   . BPH (benign prostatic hyperplasia)   . Fatty liver   . Schatzki's ring     non critical  . Hiatal hernia   . S/P CABG (coronary artery bypass graft) 2006  . PVD  (peripheral vascular disease)     0-49% R & L ICA stenosis (2013)   . History of nuclear stress test 11/2007    bruce myoview; normal pattern of persuion in all regions; post-stress EF 70%; low risk   . Gilbert's syndrome   . Thrombocytopenia   . HTN (hypertension)     Past Surgical History  Procedure Laterality Date  . Coronary artery bypass graft  04/2004    LIMA to diagonal, SVG to ramus intermedius, SVG to PDA; placed stent for crossed coronary arteries (Dr. Prescott Gum)  . Rotater cuff      repair  . Cataract extraction  2011  . Tonsillectomy    . Esophagogastroduodenoscopy  2006    Dr. Ruthell Rummage Schatzki's ring, s/p 58-F Maloney dilation  . Colonoscopy  2006    Dr. Margarito Courser pancolonic diverticula  . Colonoscopy  01/07/2012    Dr. Abbe Amsterdam normal rectum, pancolonic diverticulosis, hyperplastic polyp  . Esophagogastroduodenoscopy  01/07/2012    Dr. Gareth Morgan ring, hiatal hernia, fundic gland polyp    FAMHx:  Family History  Problem Relation Age of Onset  . Heart attack Mother 7    deceased  . Heart attack Father 68    deceased  . Colon cancer Neg Hx   . Heart disease Brother     x2  . Diabetes Brother     x3    SOCHx:   reports that he quit smoking about 9 years ago. His smoking use included Cigarettes.  He has a 40 pack-year smoking history. He has never used smokeless tobacco. He reports that he drinks alcohol. He reports that he does not use illicit drugs.  ALLERGIES:  No Known Allergies  ROS: A comprehensive review of systems was negative except for: Respiratory: positive for dyspnea on exertion  HOME MEDS: Current Outpatient Prescriptions  Medication Sig Dispense Refill  . ALPRAZolam (XANAX) 0.5 MG tablet Take 0.5 mg by mouth at bedtime as needed. Sleep.      Marland Kitchen doxazosin (CARDURA XL) 8 MG 24 hr tablet Take 1 tablet (8 mg total) by mouth at bedtime.      . flunisolide (NASALIDE) 0.025 % SOLN Inhale 2 sprays into the lungs 2 (two)  times daily.      Marland Kitchen gabapentin (NEURONTIN) 300 MG capsule Take 300 mg by mouth 3 (three) times daily.        Marland Kitchen levothyroxine (SYNTHROID, LEVOTHROID) 25 MCG tablet Take 25 mcg by mouth daily before breakfast.      . metoprolol tartrate (LOPRESSOR) 25 MG tablet Take 1 tablet (25 mg total) by mouth 2 (two) times daily.  90 tablet  3  . pantoprazole (PROTONIX) 40 MG tablet Take 40 mg by mouth daily as needed. Acid reflux      . Rivaroxaban (XARELTO) 20 MG TABS tablet Take 1 tablet (20 mg total) by mouth daily with supper.  30 tablet  6  . amLODipine (NORVASC) 5 MG tablet Take 1 tablet (5 mg total) by mouth daily.  180 tablet  3   No current facility-administered medications for this visit.    LABS/IMAGING: No results found for this or any previous visit (from the past 48 hour(s)). No results found.  VITALS: BP 164/68  Pulse 55  Ht 5' 9"  (1.753 m)  Wt 197 lb 3.2 oz (89.449 kg)  BMI 29.11 kg/m2  EXAM: General appearance: alert and no distress Neck: no carotid bruit and no JVD Lungs: clear to auscultation bilaterally Heart: regular rate and rhythm, S1, S2 normal, no murmur, click, rub or gallop Abdomen: soft, non-tender; bowel sounds normal; no masses,  no organomegaly Extremities: extremities normal, atraumatic, no cyanosis or edema Pulses: 2+ and symmetric Skin: Skin color, texture, turgor normal. No rashes or lesions Neurologic: Grossly normal Psych: Mood, affect normal  EKG:  sinus bradycardia at 55  ASSESSMENT: 1. Coronary artery disease status post three-vessel CABG in 2006 (LIMA to diagonal, SVG to PDA and SVG to ramus intermedius) 2. Hypertension-uncontrolled 3. Mild carotid artery disease 4. Mild dyspnea with marked exertion 5. PAF-on Xarelto 6. Recent postural hypotension  PLAN: 1.   [name] is doing fairly well at this point. He is now on Xarelto which he seems to be able to tolerate without bleeding problems. His blood pressure is still not well controlled and  I've encouraged her to start taking the amlodipine 5 mg daily he was prescribed in the hospital. I would not recommend restarting his diuretic or lisinopril due to his recent renal cell urine postural hypotension. He continues on statin medication. I'll plan to see him back in 6 months or sooner as necessary.  Pixie Casino, MD, [facility] Attending Cardiologist [place] 06/02/2013, 1:13 PM

## 2013-06-02 NOTE — Telephone Encounter (Signed)
RN called pharmacy and was informed that patient just needed to activate savings/trial card.  Pharmacy unable to tell at this time if a prior authorization will ne necessary   RN returned call to patient. He stated he tried to activate the card, but since he does not have prescription drug coverage and gets his meds from the [place] sometimes, the [place]"?) stated he would be mailed some paperwork to complete and have the doctor sign. Patient unsure of what the paperwork is for.   RN offered samples should patient get low on Xarelto 38m.   Advised patient to [place] to gather more information about what it is for.

## 2013-06-06 ENCOUNTER — Telehealth: Payer: Self-pay | Admitting: *Deleted

## 2013-06-06 NOTE — Telephone Encounter (Signed)
Pt was calling in regards to medication that he was told to call [place] about.  [place]

## 2013-06-06 NOTE — Telephone Encounter (Signed)
Returned call and pt verified x 2.  Pt stated he talked to Dr. Lysbeth Penner nurse last week and wanted to speak w/ her.  Informed pt Eliezer Lofts is out of the office today.  Pt asked that she call him back tomorrow.  RN asked pt if it was r/t his medicine b/c Jenna left a message that he may call back for samples if he was running low.  Pt stated it is and he has a few days worth left.  Pt would like samples and informed a month supply will be left at the front desk for him.  Pt verbalized understanding and would still like for Jenna to call him back.  Informed she will be notified.  Message forwarded to [place], Therapist, sports.

## 2013-06-07 MED ORDER — RIVAROXABAN 20 MG PO TABS: 20.0000 mg | ORAL_TABLET | Freq: Every day | ORAL | Status: DC

## 2013-06-07 NOTE — Telephone Encounter (Signed)
Returned patient's call. He states his insurance will NOT let him get xarelto since he does not have prescription drug coverage. Per a OV note on 05/04/13 from [place], Pharmacist, it appeared his had issues getting Eliquis in the past, so was switched to Xarelto, but if this were not to be covered, patient would be willing to take warfarin. Patient aware that samples will be left at front desk and Erasmo Downer will be notified of this information for advice on switching patient to warfarin.

## 2013-06-07 NOTE — Telephone Encounter (Signed)
Spoke with pt wife - he has no Part D coverage, gets most meds from [place].  We can try Savaysa 66m (CrCl 69.8) as they have the $4/month for those with no prescription coverage.  [place], will come by office for samples and copay card.  Will leave instructions that he is to take last dose of Xarelto then start Savaysa 24 hrs later.  Does not need to be with meals.    Will also leave information with JEliezer Lofts- Dr. HLysbeth Pennernurse if patient has further questions.

## 2013-06-08 ENCOUNTER — Telehealth: Payer: Self-pay | Admitting: Pharmacist Clinician (PhC)/ Clinical Pharmacy Specialist

## 2013-06-08 MED ORDER — EDOXABAN TOSYLATE 60 MG PO TABS: 60.0000 mg | ORAL_TABLET | Freq: Every day | ORAL | Status: DC

## 2013-06-08 NOTE — Telephone Encounter (Signed)
Eliquis and Xarelto not covered - pt has no Part D, gets meds thru [place] in [place].  Will give pt samples of Savaysa with $4 copay card.  He is to start the day after last dose of Xarelto.

## 2013-06-23 ENCOUNTER — Telehealth: Payer: Self-pay | Admitting: Pharmacist Clinician (PhC)/ Clinical Pharmacy Specialist

## 2013-06-23 NOTE — Telephone Encounter (Signed)
Pt LMOM to call about his blood thinner causing swelling in his feet/ankles.  Returned call, spoke with his wife, who didn't know much of the situation.  Explained to her that [facility] probably not the cause of swelling, but could be amlodipine, as that was started by Dr. Debara Pickett on 4/30.  Will call back tomorrow and speak with patient.

## 2013-07-04 DIAGNOSIS — S83419A Sprain of medial collateral ligament of unspecified knee, initial encounter: Secondary | ICD-10-CM | POA: Diagnosis not present

## 2013-07-04 DIAGNOSIS — L570 Actinic keratosis: Secondary | ICD-10-CM | POA: Diagnosis not present

## 2013-07-04 DIAGNOSIS — E039 Hypothyroidism, unspecified: Secondary | ICD-10-CM | POA: Diagnosis not present

## 2013-07-04 DIAGNOSIS — Z6829 Body mass index (BMI) 29.0-29.9, adult: Secondary | ICD-10-CM | POA: Diagnosis not present

## 2013-07-05 NOTE — Telephone Encounter (Signed)
Spoke with patient.  He went to another MD yesterday, was told he had gout in knee/ankle - was cause of swelling.  Advised pt that if after gout flare resolved, if he still has problems with swelling in ankles, to give [place] a call and we may need to make some adjustments to amlodipine.   Pt states no problems with Savaysa.  Will call if any concerns

## 2013-07-19 DIAGNOSIS — M25569 Pain in unspecified knee: Secondary | ICD-10-CM | POA: Diagnosis not present

## 2013-09-14 DIAGNOSIS — R5383 Other fatigue: Secondary | ICD-10-CM | POA: Diagnosis not present

## 2013-09-14 DIAGNOSIS — R7301 Impaired fasting glucose: Secondary | ICD-10-CM | POA: Diagnosis not present

## 2013-09-14 DIAGNOSIS — I10 Essential (primary) hypertension: Secondary | ICD-10-CM | POA: Diagnosis not present

## 2013-09-14 DIAGNOSIS — M549 Dorsalgia, unspecified: Secondary | ICD-10-CM | POA: Diagnosis not present

## 2013-09-14 DIAGNOSIS — R5381 Other malaise: Secondary | ICD-10-CM | POA: Diagnosis not present

## 2013-09-14 DIAGNOSIS — E119 Type 2 diabetes mellitus without complications: Secondary | ICD-10-CM | POA: Diagnosis not present

## 2013-09-14 DIAGNOSIS — Z6829 Body mass index (BMI) 29.0-29.9, adult: Secondary | ICD-10-CM | POA: Diagnosis not present

## 2013-09-19 DIAGNOSIS — L821 Other seborrheic keratosis: Secondary | ICD-10-CM | POA: Diagnosis not present

## 2013-09-19 DIAGNOSIS — N529 Male erectile dysfunction, unspecified: Secondary | ICD-10-CM | POA: Diagnosis not present

## 2013-09-19 DIAGNOSIS — N400 Enlarged prostate without lower urinary tract symptoms: Secondary | ICD-10-CM | POA: Diagnosis not present

## 2013-09-19 DIAGNOSIS — L570 Actinic keratosis: Secondary | ICD-10-CM | POA: Diagnosis not present

## 2013-09-19 DIAGNOSIS — N320 Bladder-neck obstruction: Secondary | ICD-10-CM | POA: Diagnosis not present

## 2013-09-19 DIAGNOSIS — D1800 Hemangioma unspecified site: Secondary | ICD-10-CM | POA: Diagnosis not present

## 2013-10-07 ENCOUNTER — Ambulatory Visit (INDEPENDENT_AMBULATORY_CARE_PROVIDER_SITE_OTHER): Payer: Medicare Other | Admitting: Internal Medicine

## 2013-10-07 ENCOUNTER — Encounter: Payer: Self-pay | Admitting: Internal Medicine

## 2013-10-07 VITALS — BP 160/70 | HR 55 | Ht 69.0 in | Wt 199.9 lb

## 2013-10-07 DIAGNOSIS — I951 Orthostatic hypotension: Secondary | ICD-10-CM

## 2013-10-07 DIAGNOSIS — R5381 Other malaise: Secondary | ICD-10-CM

## 2013-10-07 DIAGNOSIS — Z951 Presence of aortocoronary bypass graft: Secondary | ICD-10-CM

## 2013-10-07 DIAGNOSIS — R5383 Other fatigue: Secondary | ICD-10-CM

## 2013-10-07 DIAGNOSIS — I4891 Unspecified atrial fibrillation: Secondary | ICD-10-CM

## 2013-10-07 DIAGNOSIS — I10 Essential (primary) hypertension: Secondary | ICD-10-CM | POA: Diagnosis not present

## 2013-10-07 DIAGNOSIS — I2510 Atherosclerotic heart disease of native coronary artery without angina pectoris: Secondary | ICD-10-CM | POA: Diagnosis not present

## 2013-10-07 DIAGNOSIS — R531 Weakness: Secondary | ICD-10-CM

## 2013-10-07 MED ORDER — AMLODIPINE BESYLATE 5 MG PO TABS: 5.0000 mg | ORAL_TABLET | Freq: Two times a day (BID) | ORAL | Status: DC

## 2013-10-07 NOTE — Patient Instructions (Signed)
Your physician wants you to follow-up in: [place] will receive a reminder letter in the mail two months in advance. If you don't receive a letter, please call our office to schedule the follow-up appointment.   INCREASE AMLODIPINE TO 5 MG TWICE DAILY

## 2013-10-07 NOTE — Progress Notes (Signed)
OFFICE NOTE  Chief Complaint:  Occasional lightheadedness  Primary Care Physician: Glo Herring., MD  HPI:  [name] is a 80 year old gentleman who had bypass in 2006, a normal nuclear study in 2009 and has done well without any episodes of angina. He has remained active and does exercise several times a week and has had no worsening shortness of breath, palpitations, presyncope or syncopal symptoms. He also has a history of right carotid disease which he was told to be about 60% at the time of bypass, however, recent Dopplers show only mild disease. He does have a very faint bruit. He last saw Tarri Fuller, PA-C, in the office this past summer. He was having problems with elevated blood pressures at night. It was recommended that he change his blood pressure medications around, however he did not make that change.  He was recently admitted to [place] for dehydration in the setting of pneumonia or viral influenza. He was taking his diuretics in addition to ongoing fluid losses. This cause orthostatic hypotension. His diuretic was held and his lisinopril was stopped due to worsening renal function. He was switched to amlodipine for better blood pressure control, but has not been taking the medicine due to confusion of his medicines. Currently reports his blood pressures have improved and is not have any significant swelling. In fact he is now hypertensive again. He is recently switched from warfarin over to Xarelto for better control of anticoagulation for his A. fib. He seems to be able to get the medication now with out undue cost.  [name] turns today for followup. He occasionally gets some lightheadedness. He's noted some blood pressures up into the 190s at home. He says however he feels bad when his blood pressure is more in the 120s to 130s.  PMHx:  Past Medical History  Diagnosis Date  . CAD (coronary artery disease) of artery bypass graft 2006  . Neuropathy   .  GERD (gastroesophageal reflux disease)   . BPH (benign prostatic hyperplasia)   . Fatty liver   . Schatzki's ring     non critical  . Hiatal hernia   . S/P CABG (coronary artery bypass graft) 2006  . PVD (peripheral vascular disease)     0-49% R & L ICA stenosis (2013)   . History of nuclear stress test 11/2007    bruce myoview; normal pattern of persuion in all regions; post-stress EF 70%; low risk   . Gilbert's syndrome   . Thrombocytopenia   . HTN (hypertension)     Past Surgical History  Procedure Laterality Date  . Coronary artery bypass graft  04/2004    LIMA to diagonal, SVG to ramus intermedius, SVG to PDA; placed stent for crossed coronary arteries (Dr. Prescott Gum)  . Rotater cuff      repair  . Cataract extraction  2011  . Tonsillectomy    . Esophagogastroduodenoscopy  2006    Dr. Ruthell Rummage Schatzki's ring, s/p 58-F Maloney dilation  . Colonoscopy  2006    Dr. Margarito Courser pancolonic diverticula  . Colonoscopy  01/07/2012    Dr. Abbe Amsterdam normal rectum, pancolonic diverticulosis, hyperplastic polyp  . Esophagogastroduodenoscopy  01/07/2012    Dr. Gareth Morgan ring, hiatal hernia, fundic gland polyp    FAMHx:  Family History  Problem Relation Age of Onset  . Heart attack Mother 60    deceased  . Heart attack Father 81    deceased  . Colon cancer Neg Hx   . Heart  disease Brother     x2  . Diabetes Brother     x3    SOCHx:   reports that he quit smoking about 9 years ago. His smoking use included Cigarettes. He has a 40 pack-year smoking history. He has never used smokeless tobacco. He reports that he drinks alcohol. He reports that he does not use illicit drugs.  ALLERGIES:  No Known Allergies  ROS: A comprehensive review of systems was negative except for: Neurological: positive for lightheadedness  HOME MEDS: Current Outpatient Prescriptions  Medication Sig Dispense Refill  . ALPRAZolam (XANAX) 0.5 MG tablet Take 0.5 mg by mouth at  bedtime as needed. Sleep.      Marland Kitchen amLODipine (NORVASC) 5 MG tablet Take 1 tablet (5 mg total) by mouth 2 (two) times daily.  180 tablet  3  . doxazosin (CARDURA XL) 8 MG 24 hr tablet Take 1 tablet (8 mg total) by mouth at bedtime.      . Edoxaban Tosylate (SAVAYSA) 60 MG TABS Take 60 mg by mouth daily.  30 tablet  5  . Edoxaban Tosylate (SAVAYSA) 60 MG TABS Take 60 mg by mouth daily.  21 tablet  0  . flunisolide (NASALIDE) 0.025 % SOLN Inhale 2 sprays into the lungs 2 (two) times daily.      Marland Kitchen gabapentin (NEURONTIN) 300 MG capsule Take 300 mg by mouth 3 (three) times daily.        Marland Kitchen levothyroxine (SYNTHROID, LEVOTHROID) 25 MCG tablet Take 50 mcg by mouth daily before breakfast.       . metoprolol tartrate (LOPRESSOR) 25 MG tablet Take 1 tablet (25 mg total) by mouth 2 (two) times daily.  90 tablet  3  . pantoprazole (PROTONIX) 40 MG tablet Take 40 mg by mouth daily as needed. Acid reflux       No current facility-administered medications for this visit.    LABS/IMAGING: No results found for this or any previous visit (from the past 48 hour(s)). No results found.  VITALS: BP 160/70  Pulse 55  Ht 5' 9"  (1.753 m)  Wt 199 lb 14.4 oz (90.674 kg)  BMI 29.51 kg/m2  EXAM: General appearance: alert and no distress Neck: no carotid bruit and no JVD Lungs: clear to auscultation bilaterally Heart: regular rate and rhythm, S1, S2 normal, no murmur, click, rub or gallop Abdomen: soft, non-tender; bowel sounds normal; no masses,  no organomegaly Extremities: extremities normal, atraumatic, no cyanosis or edema Pulses: 2+ and symmetric Skin: Skin color, texture, turgor normal. No rashes or lesions Neurologic: Grossly normal Psych: Mood, affect normal  EKG:  sinus bradycardia at 55  ASSESSMENT: 1. Coronary artery disease status post three-vessel CABG in 2006 (LIMA to diagonal, SVG to PDA and SVG to ramus intermedius) 2. Hypertension-uncontrolled 3. Mild carotid artery disease 4. Mild  dyspnea with marked exertion 5. PAF-on Savaysa 6. Recent postural hypotension  PLAN: 1.   [name] is doing fairly well at this point. He is now on Savaysa which he seems to be able to tolerate without bleeding problems. His blood pressure is still not well controlled. I recommended adding amlodipine 5 mg at night in addition to his amlodipine 5 mg in the morning. She given better control throughout the day and help with his blood pressure. Plan to see him back in 6 months.  Pixie Casino, MD, [facility] Attending Cardiologist [facility]  HILTY,Kenneth C 10/07/2013, 9:49 AM

## 2013-10-17 ENCOUNTER — Encounter (HOSPITAL_COMMUNITY): Payer: Medicare Other

## 2013-10-17 NOTE — Progress Notes (Unsigned)
LABS FROM 09-14-13, NONE TODAY

## 2013-10-19 ENCOUNTER — Encounter (HOSPITAL_COMMUNITY): Payer: Self-pay

## 2013-10-19 ENCOUNTER — Encounter (HOSPITAL_COMMUNITY): Payer: Medicare Other | Attending: Hematology and Oncology

## 2013-10-19 VITALS — BP 132/62 | HR 61 | Temp 97.5°F | Resp 16

## 2013-10-19 DIAGNOSIS — K7689 Other specified diseases of liver: Secondary | ICD-10-CM

## 2013-10-19 DIAGNOSIS — K7581 Nonalcoholic steatohepatitis (NASH): Secondary | ICD-10-CM

## 2013-10-19 DIAGNOSIS — D696 Thrombocytopenia, unspecified: Secondary | ICD-10-CM | POA: Diagnosis not present

## 2013-10-19 NOTE — Progress Notes (Signed)
[facility]  OFFICE PROGRESS NOTE  Glo Herring., MD [place] [address]  DIAGNOSIS: Thrombocytopenia - Plan: CBC with Differential  Steatohepatitis - Plan: Comprehensive metabolic panel  Chief Complaint  Patient presents with  . Thrombocytopenia    CURRENT THERAPY: Watchful expectation and surveillance.  INTERVAL HISTORY: [name] 80 y.o. male returns for followup of chronic ITP. He also suffers from steatohepatitis with splenomegaly and moderate obesity having undergone coronary artery bypass grafting in the past in the setting of hypertension and gastroesophageal reflux disease.  Patient is currently taking Savaysa 60 mg orally daily (factor X -A iinhibitor) and denies any epistaxis, melena, hematochezia, hematuria, or hemoptysis. He does bruise easily. He denies any chest pain, PND, orthopnea, palpitations, skin rash, nausea, vomiting, diarrhea, constipation, dysuria, hematuria, but with one or 2 episodes of nocturia nightly. He denies any headache or seizures.       MEDICAL HISTORY: Past Medical History  Diagnosis Date  . CAD (coronary artery disease) of artery bypass graft 2006  . Neuropathy   . GERD (gastroesophageal reflux disease)   . BPH (benign prostatic hyperplasia)   . Fatty liver   . Schatzki's ring     non critical  . Hiatal hernia   . S/P CABG (coronary artery bypass graft) 2006  . PVD (peripheral vascular disease)     0-49% R & L ICA stenosis (2013)   . History of nuclear stress test 11/2007    bruce myoview; normal pattern of persuion in all regions; post-stress EF 70%; low risk   . Gilbert's syndrome   . Thrombocytopenia   . HTN (hypertension)     INTERIM HISTORY: has Nausea; GERD (gastroesophageal reflux disease); Diarrhea; Bloating symptom; Thrombocytopenia, unspecified; CAD (coronary artery disease); S/P CABG x 3; HTN (hypertension); Carotid stenosis; Postural  hypotension; Weakness generalized; Acute kidney injury; Hypotension; Atrial fibrillation with RVR; and Hypotension, postural on his problem list.    ALLERGIES:  has No Known Allergies.  MEDICATIONS: has a current medication list which includes the following prescription(s): alprazolam, amlodipine, doxazosin, edoxaban, edoxaban, flunisolide, gabapentin, levothyroxine, metoprolol tartrate, and pantoprazole.  SURGICAL HISTORY:  Past Surgical History  Procedure Laterality Date  . Coronary artery bypass graft  04/2004    LIMA to diagonal, SVG to ramus intermedius, SVG to PDA; placed stent for crossed coronary arteries (Dr. Prescott Gum)  . Rotater cuff      repair  . Cataract extraction  2011  . Tonsillectomy    . Esophagogastroduodenoscopy  2006    Dr. Ruthell Rummage Schatzki's ring, s/p 58-F Maloney dilation  . Colonoscopy  2006    Dr. Margarito Courser pancolonic diverticula  . Colonoscopy  01/07/2012    Dr. Abbe Amsterdam normal rectum, pancolonic diverticulosis, hyperplastic polyp  . Esophagogastroduodenoscopy  01/07/2012    Dr. Gareth Morgan ring, hiatal hernia, fundic gland polyp    FAMILY HISTORY: family history includes Diabetes in his brother; Heart attack (age of onset: 19) in his mother; Heart attack (age of onset: 25) in his father; Heart disease in his brother. There is no history of Colon cancer.  SOCIAL HISTORY:  reports that he quit smoking about 9 years ago. His smoking use included Cigarettes. He has a 40 pack-year smoking history. He has never used smokeless tobacco. He reports that he drinks alcohol. He reports that he does not use illicit drugs.  REVIEW OF SYSTEMS:  Other than that discussed above is noncontributory.  PHYSICAL EXAMINATION: ECOG PERFORMANCE STATUS:  1 - Symptomatic but completely ambulatory  Blood pressure 132/62, pulse 61, temperature 97.5 F (36.4 C), temperature source Oral, resp. rate 16, SpO2 100.00%.  GENERAL:alert, no distress and  comfortable SKIN: skin color, texture, turgor are normal, no rashes or significant lesions EYES: PERLA; Conjunctiva are pink and non-injected, sclera clear SINUSES: No redness or tenderness over maxillary or ethmoid sinuses OROPHARYNX:no exudate, no erythema on lips, buccal mucosa, or tongue. NECK: supple, thyroid normal size, non-tender, without nodularity. No masses CHEST: Midline sternotomy scar that is well-healed. No gynecomastia. LYMPH:  no palpable lymphadenopathy in the cervical, axillary or inguinal LUNGS: clear to auscultation and percussion with normal breathing effort HEART: Irregularly irregular with no S3. ABDOMEN: normal bowel sounds with no organomegaly, ascites, or CVA tenderness.  MUSCULOSKELETAL:no cyanosis of digits and no clubbing. Range of motion normal.  small ecchymoses both upper extremities. NEURO: alert & oriented x 3 with fluent speech, no focal motor/sensory deficits   LABORATORY DATA:  09/14/2013: WBC 5.7, hemoglobin 13.5, platelets 130,000.   No visits with results within 30 Day(s) from this visit. Latest known visit with results is:  Admission on 04/15/2013, Discharged on 04/17/2013  Component Date Value Ref Range Status  . Color, Urine 04/15/2013 YELLOW  YELLOW Final  . APPearance 04/15/2013 CLEAR  CLEAR Final  . Specific Gravity, Urine 04/15/2013 >1.030* 1.005 - 1.030 Final  . pH 04/15/2013 5.0  5.0 - 8.0 Final  . Glucose, UA 04/15/2013 NEGATIVE  NEGATIVE mg/dL Final  . Hgb urine dipstick 04/15/2013 NEGATIVE  NEGATIVE Final  . Bilirubin Urine 04/15/2013 NEGATIVE  NEGATIVE Final  . Ketones, ur 04/15/2013 NEGATIVE  NEGATIVE mg/dL Final  . Protein, ur 04/15/2013 NEGATIVE  NEGATIVE mg/dL Final  . Urobilinogen, UA 04/15/2013 0.2  0.0 - 1.0 mg/dL Final  . Nitrite 04/15/2013 NEGATIVE  NEGATIVE Final  . Leukocytes, UA 04/15/2013 NEGATIVE  NEGATIVE Final   MICROSCOPIC NOT DONE ON URINES WITH NEGATIVE PROTEIN, BLOOD, LEUKOCYTES, NITRITE, OR GLUCOSE <1000  mg/dL.  . WBC 04/15/2013 4.6  4.0 - 10.5 K/uL Final  . RBC 04/15/2013 4.15* 4.22 - 5.81 MIL/uL Final  . Hemoglobin 04/15/2013 14.2  13.0 - 17.0 g/dL Final  . HCT 04/15/2013 40.2  39.0 - 52.0 % Final  . MCV 04/15/2013 96.9  78.0 - 100.0 fL Final  . MCH 04/15/2013 34.2* 26.0 - 34.0 pg Final  . MCHC 04/15/2013 35.3  30.0 - 36.0 g/dL Final  . RDW 04/15/2013 13.2  11.5 - 15.5 % Final  . Platelets 04/15/2013 116* 150 - 400 K/uL Final  . Neutrophils Relative % 04/15/2013 70  43 - 77 % Final  . Neutro Abs 04/15/2013 3.2  1.7 - 7.7 K/uL Final  . Lymphocytes Relative 04/15/2013 16  12 - 46 % Final  . Lymphs Abs 04/15/2013 0.7  0.7 - 4.0 K/uL Final  . Monocytes Relative 04/15/2013 13* 3 - 12 % Final  . Monocytes Absolute 04/15/2013 0.6  0.1 - 1.0 K/uL Final  . Eosinophils Relative 04/15/2013 1  0 - 5 % Final  . Eosinophils Absolute 04/15/2013 0.0  0.0 - 0.7 K/uL Final  . Basophils Relative 04/15/2013 0  0 - 1 % Final  . Basophils Absolute 04/15/2013 0.0  0.0 - 0.1 K/uL Final  . Smear Review 04/15/2013 PLATELETS APPEAR DECREASED   Final   PLATELET COUNT CONFIRMED BY SMEAR  . Sodium 04/15/2013 138  137 - 147 mEq/L Final  . Potassium 04/15/2013 4.2  3.7 - 5.3 mEq/L Final  . Chloride 04/15/2013 98  96 - 112 mEq/L Final  . CO2 04/15/2013 26  19 - 32 mEq/L Final  . Glucose, Bld 04/15/2013 115* 70 - 99 mg/dL Final  . BUN 04/15/2013 35* 6 - 23 mg/dL Final  . Creatinine, Ser 04/15/2013 1.99* 0.50 - 1.35 mg/dL Final  . Calcium 04/15/2013 9.3  8.4 - 10.5 mg/dL Final  . GFR calc non Af Amer 04/15/2013 31* >90 mL/min Final  . GFR calc Af Amer 04/15/2013 36* >90 mL/min Final   Comment: (NOTE)                          The eGFR has been calculated using the CKD EPI equation.                          This calculation has not been validated in all clinical situations.                          eGFR's persistently <90 mL/min signify possible Chronic Kidney                          Disease.  . Lactic Acid,  Venous 04/15/2013 2.3* 0.5 - 2.2 mmol/L Final  . Troponin I 04/15/2013 <0.30  <0.30 ng/mL Final   Comment:                                 Due to the release kinetics of cTnI,                          a negative result within the first hours                          of the onset of symptoms does not rule out                          myocardial infarction with certainty.                          If myocardial infarction is still suspected,                          repeat the test at appropriate intervals.  Marland Kitchen Specimen Description 04/15/2013 BLOOD LEFT HAND   Final  . Special Requests 04/15/2013 BOTTLES DRAWN AEROBIC AND ANAEROBIC 8CC   Final  . Culture 04/15/2013 NO GROWTH 5 DAYS   Final  . Report Status 04/15/2013 04/20/2013 FINAL   Final  . Specimen Description 04/15/2013 BLOOD LEFT HAND   Final  . Special Requests 04/15/2013 BOTTLES DRAWN AEROBIC AND ANAEROBIC 8CC   Final  . Culture 04/15/2013 NO GROWTH 5 DAYS   Final  . Report Status 04/15/2013 04/20/2013 FINAL   Final  . Cortisol, Plasma 04/15/2013 10.9   Final   Comment: (NOTE)                          AM:  4.3 - 22.4 ug/dL                          PM:  3.1 - 16.7 ug/dL                          Performed at [facility]  . TSH 04/15/2013 1.327  0.350 - 4.500 uIU/mL Final   Performed at [facility]  . Troponin I 04/15/2013 <0.30  <0.30 ng/mL Final   Comment:                                 Due to the release kinetics of cTnI,                          a negative result within the first hours                          of the onset of symptoms does not rule out                          myocardial infarction with certainty.                          If myocardial infarction is still suspected,                          repeat the test at appropriate intervals.  . Troponin I 04/16/2013 <0.30  <0.30 ng/mL Final   Comment:                                 Due to the release kinetics of cTnI,                          a  negative result within the first hours                          of the onset of symptoms does not rule out                          myocardial infarction with certainty.                          If myocardial infarction is still suspected,                          repeat the test at appropriate intervals.  . Cortisol, Plasma 04/16/2013 4.0   Final   Comment: (NOTE)                          AM:  4.3 - 22.4 ug/dL                          PM:  3.1 - 16.7 ug/dL                          Performed at [facility]  . Troponin I 04/16/2013 <0.30  <0.30 ng/mL Final   Comment:  Due to the release kinetics of cTnI,                          a negative result within the first hours                          of the onset of symptoms does not rule out                          myocardial infarction with certainty.                          If myocardial infarction is still suspected,                          repeat the test at appropriate intervals.  . Sodium 04/16/2013 138  137 - 147 mEq/L Final  . Potassium 04/16/2013 4.4  3.7 - 5.3 mEq/L Final  . Chloride 04/16/2013 104  96 - 112 mEq/L Final  . CO2 04/16/2013 19  19 - 32 mEq/L Final  . Glucose, Bld 04/16/2013 101* 70 - 99 mg/dL Final  . BUN 04/16/2013 23  6 - 23 mg/dL Final   DELTA CHECK NOTED  . Creatinine, Ser 04/16/2013 1.12  0.50 - 1.35 mg/dL Final   DELTA CHECK NOTED  . Calcium 04/16/2013 8.3* 8.4 - 10.5 mg/dL Final  . GFR calc non Af Amer 04/16/2013 61* >90 mL/min Final  . GFR calc Af Amer 04/16/2013 71* >90 mL/min Final   Comment: (NOTE)                          The eGFR has been calculated using the CKD EPI equation.                          This calculation has not been validated in all clinical situations.                          eGFR's persistently <90 mL/min signify possible Chronic Kidney                          Disease.    PATHOLOGY:no new pathology. Peripheral smear failed to reveal  evidence of clumping.  Urinalysis    Component Value Date/Time   COLORURINE YELLOW 04/15/2013 1343   APPEARANCEUR CLEAR 04/15/2013 1343   LABSPEC >1.030* 04/15/2013 1343   PHURINE 5.0 04/15/2013 1343   GLUCOSEU NEGATIVE 04/15/2013 1343   HGBUR NEGATIVE 04/15/2013 1343   BILIRUBINUR NEGATIVE 04/15/2013 1343   KETONESUR NEGATIVE 04/15/2013 1343   PROTEINUR NEGATIVE 04/15/2013 1343   UROBILINOGEN 0.2 04/15/2013 1343   NITRITE NEGATIVE 04/15/2013 1343   LEUKOCYTESUR NEGATIVE 04/15/2013 1343    RADIOGRAPHIC STUDIES: DG Chest 2 View Status: Final result         PACS Images    Show images for DG Chest 2 View         Study Result    CLINICAL DATA: Cough for 2-3 months  EXAM:  CHEST 2 VIEW  COMPARISON: Portable chest x-ray of 04/15/2013 and chest x-ray of  07/02/2012  FINDINGS:  Minimal streakiness is noted in the periphery of the right mid lung  field.  This could represent scarring, but either attention to this  area on followup chest x-ray or CT of the chest would be recommended  in view of the patient's prior smoking history. Right apical pleural  thickening is noted. Mild cardiomegaly is stable and median  sternotomy sutures are. The aortic arch is somewhat ectatic. Only  mild degenerative change is noted throughout the thoracic spine.  IMPRESSION:  Slight haziness in the right mid lung may be due to scarring, but  either followup chest x-ray or CT of the chest is recommended to  assess further.  Electronically Signed  By: Ivar Drape M.D.  On: 05/10/2013 09:30         ASSESSMENT:  #1. Chronic immune pancytopenia, stable.  #2. Recent tick bite one year ago with appropriate antibiotic therapy for Lyme disease, no obvious sequelae  #3. Coronary artery disease, status post coronary artery bypass, no evidence of heart failure or dysrhythmia.  #4. Hypertension, controlled.  #5. Hypertension  #6 degenerative joint disease, stable.  #7. Steatohepatitis with splenomegaly,  enlarged spleen not appreciated on physical examination    PLAN:  #1. Patient was warned about severe bruising, epistaxis, melena, hematochezia, hematuria, or the development of petechiae. #2. No further appointments were made in this office.    All questions were answered. The patient knows to call the clinic with any problems, questions or concerns. We can certainly see the patient much sooner if necessary.   I spent 25 minutes counseling the patient face to face. The total time spent in the appointment was 30 minutes.    Doroteo Bradford, MD 10/19/2013 1:46 PM  DISCLAIMER:  This note was dictated with voice recognition software.  Similar sounding words can inadvertently be transcribed inaccurately and may not be corrected upon review.

## 2013-10-19 NOTE — Patient Instructions (Signed)
[facility] Discharge Instructions  RECOMMENDATIONS MADE BY THE CONSULTANT AND ANY TEST RESULTS WILL BE SENT TO YOUR REFERRING PHYSICIAN.  EXAM FINDINGS BY THE PHYSICIAN TODAY AND SIGNS OR SYMPTOMS TO REPORT TO CLINIC OR PRIMARY PHYSICIAN: Exam and findings as discussed by Dr Barnet Glasgow.    No further follow-up is necessary with our clinic. Please call for questions or concerns if you need [place] in the future.    Thank you for choosing [facility] to provide your oncology and hematology care.  To afford each patient quality time with our providers, please arrive at least 15 minutes before your scheduled appointment time.  With your help, our goal is to use those 15 minutes to complete the necessary work-up to ensure our physicians have the information they need to help with your evaluation and healthcare recommendations.    Effective January 1st, 2014, we ask that you re-schedule your appointment with our physicians should you arrive 10 or more minutes late for your appointment.  We strive to give you quality time with our providers, and arriving late affects you and other patients whose appointments are after yours.    Again, thank you for choosing [facility].  Our hope is that these requests will decrease the amount of time that you wait before being seen by our physicians.       _____________________________________________________________  Should you have questions after your visit to [facility], please contact our office at [phone] between the hours of 8:30 a.m. and 4:30 p.m.  Voicemails left after 4:30 p.m. will not be returned until the following business day.  For prescription refill requests, have your pharmacy contact our office with your prescription refill request.    _______________________________________________________________  We hope that we have given you very good care.  You may receive a patient satisfaction  survey in the mail, please complete it and return it as soon as possible.  We value your feedback!  _______________________________________________________________  Have you asked about our STAR program?  STAR stands for Survivorship Training and Rehabilitation, and this is a nationally recognized cancer care program that focuses on survivorship and rehabilitation.  Cancer and cancer treatments may cause problems, such as, pain, making you feel tired and keeping you from doing the things that you need or want to do. Cancer rehabilitation can help. Our goal is to reduce these troubling effects and help you have the best quality of life possible.  You may receive a survey from a nurse that asks questions about your current state of health.  Based on the survey results, all eligible patients will be referred to the [facility] program for an evaluation so we can better serve you!  A frequently asked questions sheet is available upon request.

## 2013-10-31 ENCOUNTER — Telehealth: Payer: Self-pay | Admitting: Pharmacist Clinician (PhC)/ Clinical Pharmacy Specialist

## 2013-10-31 NOTE — Telephone Encounter (Signed)
Pt called, LMOM that he thought Savaysa was causing his legs to hurt and swell.  Wanted advise on what to do.  Returned call.  Spoke with patient, stated he started having problems in the past week or two, and Savaysa was his newest medication. Upon further conversation and chart review, I learned that he had recently stopped using furosemide and his amlodipine was doubled from 5 to 96m daily.    Explained that the swelling was most likely a side effect of the increased dose of amlodipine, perhaps worsened by the discontinuation of furosemide.  Advised that he decrease amlodipine to 5 mg daily, but not to stop Savaysa.  Pt is to call back in 3-5 days and let [place] if any improvement in swelling.  Also advised that if swelling continued or pain worsened, he should go to Urgent Care or ER over weekend.  Pt voiced understanding.

## 2013-11-09 ENCOUNTER — Telehealth: Payer: Self-pay | Admitting: Internal Medicine

## 2013-11-09 NOTE — Telephone Encounter (Signed)
Please call,legs been bothering him real bad. Sometimes he says he can hardly walk,thinks it might be coming from the [facility].

## 2013-11-09 NOTE — Telephone Encounter (Signed)
I haven't seen this side effect with NOACs before.   Dr. Lemmie Evens

## 2013-11-09 NOTE — Telephone Encounter (Signed)
Returned call to patient. He reports a tightness/cramping feeling in his legs, bilaterally, (hamstring area). He denies swelling, discoloration of legs. He reports he made medication changes per Erasmo Downer on 9/28 when he called in with similar complaints (decrease amlodipine - reports BP is good, and stopping lasix). He is still having leg discomfort. He reports staying hydrated. He reports that he has had issues with his legs similar to this prior to starting anti-coagulants, but his symptoms have seemed to progressively worsen with the addition of anti-coagulants and occurred not just with Savaysa but the previous NOACs he tried. He states he is not sure his symptoms are from the medications or not.   Will defer to Erasmo Downer, PharmD to advise and Dr. Debara Pickett

## 2013-11-11 NOTE — Telephone Encounter (Signed)
Pt states still having occasional leg discomfort, found that it is relieved with ibuprofen.  States he took 2 tablets and felt better for several days.  Some discomfort now, but not as much.  Pt is wondering if more related to arthritis.  Will stay on Savaysa for now, he is to call if has further concerns.

## 2013-11-16 DIAGNOSIS — E6609 Other obesity due to excess calories: Secondary | ICD-10-CM | POA: Diagnosis not present

## 2013-11-16 DIAGNOSIS — R109 Unspecified abdominal pain: Secondary | ICD-10-CM | POA: Diagnosis not present

## 2013-11-16 DIAGNOSIS — R1030 Lower abdominal pain, unspecified: Secondary | ICD-10-CM | POA: Diagnosis not present

## 2013-11-16 DIAGNOSIS — Z6830 Body mass index (BMI) 30.0-30.9, adult: Secondary | ICD-10-CM | POA: Diagnosis not present

## 2013-11-16 DIAGNOSIS — Z23 Encounter for immunization: Secondary | ICD-10-CM | POA: Diagnosis not present

## 2013-11-16 DIAGNOSIS — M25561 Pain in right knee: Secondary | ICD-10-CM | POA: Diagnosis not present

## 2014-01-05 ENCOUNTER — Ambulatory Visit (HOSPITAL_COMMUNITY)
Admission: RE | Admit: 2014-01-05 | Discharge: 2014-01-05 | Disposition: A | Discharge status: Home / Self Care | Payer: Medicare Other | Source: Ambulatory Visit | Attending: Family Medicine | Admitting: Family Medicine

## 2014-01-05 ENCOUNTER — Other Ambulatory Visit (HOSPITAL_COMMUNITY): Payer: Self-pay | Admitting: Family Medicine

## 2014-01-05 DIAGNOSIS — E6609 Other obesity due to excess calories: Secondary | ICD-10-CM | POA: Diagnosis not present

## 2014-01-05 DIAGNOSIS — E039 Hypothyroidism, unspecified: Secondary | ICD-10-CM | POA: Diagnosis not present

## 2014-01-05 DIAGNOSIS — M1711 Unilateral primary osteoarthritis, right knee: Secondary | ICD-10-CM | POA: Diagnosis not present

## 2014-01-05 DIAGNOSIS — R5383 Other fatigue: Secondary | ICD-10-CM | POA: Diagnosis not present

## 2014-01-05 DIAGNOSIS — M25561 Pain in right knee: Secondary | ICD-10-CM

## 2014-01-05 DIAGNOSIS — D649 Anemia, unspecified: Secondary | ICD-10-CM | POA: Diagnosis not present

## 2014-01-05 DIAGNOSIS — Z79899 Other long term (current) drug therapy: Secondary | ICD-10-CM | POA: Diagnosis not present

## 2014-01-05 DIAGNOSIS — Z6831 Body mass index (BMI) 31.0-31.9, adult: Secondary | ICD-10-CM | POA: Diagnosis not present

## 2014-01-10 ENCOUNTER — Telehealth: Payer: Self-pay | Admitting: Internal Medicine

## 2014-01-10 ENCOUNTER — Encounter: Payer: Self-pay | Admitting: *Deleted

## 2014-01-10 NOTE — Telephone Encounter (Signed)
Received ok from Dr. Debara Pickett for pt. To hold Savaysa  For 48hrs before dental procedure, pt. Was having up to 3 teeth pulled at Dr. Geradine Girt office

## 2014-01-18 ENCOUNTER — Other Ambulatory Visit: Payer: Self-pay | Admitting: Internal Medicine

## 2014-01-30 ENCOUNTER — Telehealth: Payer: Self-pay | Admitting: Internal Medicine

## 2014-01-30 MED ORDER — HYDROCHLOROTHIAZIDE 25 MG PO TABS: ORAL_TABLET | ORAL | Status: DC

## 2014-01-30 NOTE — Telephone Encounter (Signed)
Spoke to DOD Dr.Kelly he advised [facility] will not cause swelling.Advised ok to take HCTZ 25 mg daily for the next 4 days then take daily if needed.Patient stated he is not taking Amlodipine 5 mg twice a day.Stated he only takes Amlodipine 5 mg daily 2 to 3 times a week.Stated his blood pressure has been normal.Advised to call back if continues to have swelling.

## 2014-01-30 NOTE — Telephone Encounter (Signed)
His legs are still hurting and ankles are still swelling. Please call and let him know if he might need to start back taking his fluid medicine.

## 2014-01-30 NOTE — Telephone Encounter (Signed)
Returned call to patient he stated he has been having swelling and pain in lower legs and ankles for the past 2 to 3 months.Stated he noticed swelling since he has been taking Savaysa.Stated he use to take hctz as needed,but that was stopped last time he was in hospital.Dr.Hilty out of office this week will check with DOD Dr.Kelly.

## 2014-01-31 ENCOUNTER — Encounter: Payer: Self-pay | Admitting: Internal Medicine

## 2014-03-03 DIAGNOSIS — E6609 Other obesity due to excess calories: Secondary | ICD-10-CM | POA: Diagnosis not present

## 2014-03-03 DIAGNOSIS — E039 Hypothyroidism, unspecified: Secondary | ICD-10-CM | POA: Diagnosis not present

## 2014-03-03 DIAGNOSIS — M25561 Pain in right knee: Secondary | ICD-10-CM | POA: Diagnosis not present

## 2014-03-03 DIAGNOSIS — Z6831 Body mass index (BMI) 31.0-31.9, adult: Secondary | ICD-10-CM | POA: Diagnosis not present

## 2014-03-22 DIAGNOSIS — L570 Actinic keratosis: Secondary | ICD-10-CM | POA: Diagnosis not present

## 2014-04-04 ENCOUNTER — Other Ambulatory Visit: Payer: Self-pay | Admitting: Internal Medicine

## 2014-04-04 NOTE — Telephone Encounter (Signed)
Rx refill sent to patient pharmacy   

## 2014-04-05 ENCOUNTER — Ambulatory Visit: Payer: Medicare Other | Admitting: Internal Medicine

## 2014-04-06 DIAGNOSIS — E063 Autoimmune thyroiditis: Secondary | ICD-10-CM | POA: Diagnosis not present

## 2014-04-06 DIAGNOSIS — N400 Enlarged prostate without lower urinary tract symptoms: Secondary | ICD-10-CM | POA: Diagnosis not present

## 2014-04-06 DIAGNOSIS — R319 Hematuria, unspecified: Secondary | ICD-10-CM | POA: Diagnosis not present

## 2014-04-06 DIAGNOSIS — R7301 Impaired fasting glucose: Secondary | ICD-10-CM | POA: Diagnosis not present

## 2014-04-06 DIAGNOSIS — E669 Obesity, unspecified: Secondary | ICD-10-CM | POA: Diagnosis not present

## 2014-04-06 DIAGNOSIS — R3919 Other difficulties with micturition: Secondary | ICD-10-CM | POA: Diagnosis not present

## 2014-04-06 DIAGNOSIS — Z6830 Body mass index (BMI) 30.0-30.9, adult: Secondary | ICD-10-CM | POA: Diagnosis not present

## 2014-05-03 ENCOUNTER — Encounter: Payer: Self-pay | Admitting: Internal Medicine

## 2014-05-03 ENCOUNTER — Ambulatory Visit (INDEPENDENT_AMBULATORY_CARE_PROVIDER_SITE_OTHER): Payer: Medicare Other | Admitting: Internal Medicine

## 2014-05-03 VITALS — BP 158/66 | HR 58 | Ht 69.0 in | Wt 209.8 lb

## 2014-05-03 DIAGNOSIS — I2510 Atherosclerotic heart disease of native coronary artery without angina pectoris: Secondary | ICD-10-CM

## 2014-05-03 DIAGNOSIS — Z951 Presence of aortocoronary bypass graft: Secondary | ICD-10-CM | POA: Diagnosis not present

## 2014-05-03 DIAGNOSIS — I6529 Occlusion and stenosis of unspecified carotid artery: Secondary | ICD-10-CM | POA: Diagnosis not present

## 2014-05-03 DIAGNOSIS — I2583 Coronary atherosclerosis due to lipid rich plaque: Secondary | ICD-10-CM

## 2014-05-03 DIAGNOSIS — I10 Essential (primary) hypertension: Secondary | ICD-10-CM

## 2014-05-03 DIAGNOSIS — Z13220 Encounter for screening for lipoid disorders: Secondary | ICD-10-CM

## 2014-05-03 DIAGNOSIS — Z79899 Other long term (current) drug therapy: Secondary | ICD-10-CM

## 2014-05-03 MED ORDER — HYDROCHLOROTHIAZIDE 25 MG PO TABS: 12.5000 mg | ORAL_TABLET | Freq: Every day | ORAL | Status: DC

## 2014-05-03 NOTE — Progress Notes (Signed)
OFFICE NOTE  Chief Complaint:  Occasional lightheadedness  Primary Care Physician: Glo Herring., MD  HPI:  [name] is a 81 year old gentleman who had bypass in 2006, a normal nuclear study in 2009 and has done well without any episodes of angina. He has remained active and does exercise several times a week and has had no worsening shortness of breath, palpitations, presyncope or syncopal symptoms. He also has a history of right carotid disease which he was told to be about 60% at the time of bypass, however, recent Dopplers show only mild disease. He does have a very faint bruit. He last saw Shane Fuller, PA-[name], in the office this past summer. He was having problems with elevated blood pressures at night. It was recommended that he change his blood pressure medications around, however he did not make that change.  He was recently admitted to [place] [facility] for dehydration in the setting of pneumonia or viral influenza. He was taking his diuretics in addition to ongoing fluid losses. This cause orthostatic hypotension. His diuretic was held and his lisinopril was stopped due to worsening renal function. He was switched to amlodipine for better blood pressure control, but has not been taking the medicine due to confusion of his medicines. Currently reports his blood pressures have improved and is not have any significant swelling. In fact he is now hypertensive again. He is recently switched from warfarin over to Xarelto for better control of anticoagulation for his A. fib. He seems to be able to get the medication now with out undue cost.  [name] turns today for followup. He occasionally gets some lightheadedness. He's noted some blood pressures up into the 190s at home. He says however he feels bad when his blood pressure is more in the 120s to 130s.  I saw [name] back today in the office. Overall he reports doing fairly well. He decreased his exercise somewhat due to his wife  being sick but is interested in getting back into exercise routine. His last stress test was 7 years ago. His bypass was now 10 years ago. He denies any chest pain or worsening shortness of breath. Blood pressure is running somewhat high. His diuretics were stopped due to worsening renal function which seems to have normalized.  PMHx:  Past Medical History  Diagnosis Date  . CAD (coronary artery disease) of artery bypass graft 2006  . Neuropathy   . GERD (gastroesophageal reflux disease)   . BPH (benign prostatic hyperplasia)   . Fatty liver   . Schatzki's ring     non critical  . Hiatal hernia   . S/P CABG (coronary artery bypass graft) 2006  . PVD (peripheral vascular disease)     0-49% R & L ICA stenosis (2013)   . History of nuclear stress test 11/2007    bruce myoview; normal pattern of persuion in all regions; post-stress EF 70%; low risk   . Gilbert's syndrome   . Thrombocytopenia   . HTN (hypertension)     Past Surgical History  Procedure Laterality Date  . Coronary artery bypass graft  04/2004    LIMA to diagonal, SVG to ramus intermedius, SVG to PDA; placed stent for crossed coronary arteries (Dr. Prescott Gum)  . Rotater cuff      repair  . Cataract extraction  2011  . Tonsillectomy    . Esophagogastroduodenoscopy  2006    Dr. Ruthell Rummage Schatzki's ring, s/p 58-F Maloney dilation  . Colonoscopy  2006  Dr. Margarito Courser pancolonic diverticula  . Colonoscopy  01/07/2012    Dr. Abbe Amsterdam normal rectum, pancolonic diverticulosis, hyperplastic polyp  . Esophagogastroduodenoscopy  01/07/2012    Dr. Gareth Morgan ring, hiatal hernia, fundic gland polyp    FAMHx:  Family History  Problem Relation Age of Onset  . Heart attack Mother 34    deceased  . Heart attack Father 84    deceased  . Colon cancer Neg Hx   . Heart disease Brother     x2  . Diabetes Brother     x3    SOCHx:   reports that he quit smoking about 10 years ago. His smoking use  included Cigarettes. He has a 40 pack-year smoking history. He has never used smokeless tobacco. He reports that he drinks alcohol. He reports that he does not use illicit drugs.  ALLERGIES:  No Known Allergies  ROS: A comprehensive review of systems was negative except for: Musculoskeletal: positive for muscle weakness  HOME MEDS: Current Outpatient Prescriptions  Medication Sig Dispense Refill  . ALPRAZolam (XANAX) 0.5 MG tablet Take 0.5 mg by mouth at bedtime as needed. Sleep.    Marland Kitchen amLODipine (NORVASC) 5 MG tablet Take 1 tablet (5 mg total) by mouth 2 (two) times daily. 180 tablet 3  . doxazosin (CARDURA XL) 8 MG 24 hr tablet Take 1 tablet (8 mg total) by mouth at bedtime.    . flunisolide (NASALIDE) 0.025 % SOLN Inhale 2 sprays into the lungs 2 (two) times daily.    Marland Kitchen gabapentin (NEURONTIN) 300 MG capsule Take 300 mg by mouth 3 (three) times daily.      . hydrochlorothiazide (HYDRODIURIL) 25 MG tablet Take 0.5 tablets (12.5 mg total) by mouth daily. 30 tablet 6  . levothyroxine (SYNTHROID, LEVOTHROID) 25 MCG tablet Take 50 mcg by mouth daily before breakfast.     . metoprolol tartrate (LOPRESSOR) 25 MG tablet Take 12.5-25 mg by mouth 2 (two) times daily.    . pantoprazole (PROTONIX) 40 MG tablet Take 40 mg by mouth daily as needed. Acid reflux    . SAVAYSA 60 MG TABS tablet TAKE ONE TABLET BY MOUTH ONCE DAILY 30 tablet 3   No current facility-administered medications for this visit.    LABS/IMAGING: No results found for this or any previous visit (from the past 48 hour(s)). No results found.  VITALS: BP 158/66 mmHg  Pulse 58  Ht 5' 9"  (1.753 m)  Wt 209 lb 12.8 oz (95.165 kg)  BMI 30.97 kg/m2  EXAM: General appearance: alert and no distress Neck: no carotid bruit and no JVD Lungs: clear to auscultation bilaterally Heart: regular rate and rhythm, S1, S2 normal, no murmur, click, rub or gallop Abdomen: soft, non-tender; bowel sounds normal; no masses,  no  organomegaly Extremities: extremities normal, atraumatic, no cyanosis or edema Pulses: 2+ and symmetric Skin: Skin color, texture, turgor normal. No rashes or lesions Neurologic: Grossly normal Psych: Mood, affect normal  EKG: Sinus bradycardia with first degree AV block at 58  ASSESSMENT: 1. Coronary artery disease status post three-vessel CABG in 2006 (LIMA to diagonal, SVG to PDA and SVG to ramus intermedius) 2. Hypertension-uncontrolled 3. Mild carotid artery disease 4. Mild dyspnea with marked exertion 5. PAF-on Savaysa  PLAN: 1.   [name] is generally doing well. Is content to play starting a new exercise program. His last stress test was in 2009 and his bypass was almost 10 years ago. I would recommend an exercise nuclear stress test. Blood pressure is still  not at goal. I think it be helpful to restart low-dose HCTZ. We'll recheck a BMET in one week. He occasionally gets some swelling which may improve with medicine. He is tolerating Savaysa for his PAF and has not had recurrence of palpitations. No bleeding issues were noted. He does have some mild carotid disease and coronary disease but reports she's had "excellent cholesterol". I would like to recheck that as well to see if he should be on a statin. By guidelines he should be. Plan to see him back in 6 months.  Pixie Casino, MD, [facility] Attending Cardiologist [facility]  [name],[name] 05/03/2014, 10:14 AM

## 2014-05-03 NOTE — Patient Instructions (Signed)
Your physician has requested that you have en exercise stress myoview. For further information please visit [email or web address]. Please follow instruction sheet, as given.  Your physician recommends that you return for lab work in: 1 week  Please start hydrochlorothiazide 12.12m (1/2 tablet) once daily  Your physician wants you to follow-up in: 6 months with Dr. HDebara Pickett You will receive a reminder letter in the mail two months in advance. If you don't receive a letter, please call our office to schedule the follow-up appointment.

## 2014-05-10 ENCOUNTER — Other Ambulatory Visit: Payer: Self-pay | Admitting: Internal Medicine

## 2014-05-10 DIAGNOSIS — Z13220 Encounter for screening for lipoid disorders: Secondary | ICD-10-CM | POA: Diagnosis not present

## 2014-05-10 DIAGNOSIS — I10 Essential (primary) hypertension: Secondary | ICD-10-CM | POA: Diagnosis not present

## 2014-05-10 DIAGNOSIS — Z951 Presence of aortocoronary bypass graft: Secondary | ICD-10-CM | POA: Diagnosis not present

## 2014-05-10 DIAGNOSIS — Z79899 Other long term (current) drug therapy: Secondary | ICD-10-CM | POA: Diagnosis not present

## 2014-05-10 DIAGNOSIS — I6529 Occlusion and stenosis of unspecified carotid artery: Secondary | ICD-10-CM | POA: Diagnosis not present

## 2014-05-11 ENCOUNTER — Telehealth (HOSPITAL_COMMUNITY): Payer: Self-pay

## 2014-05-11 LAB — BASIC METABOLIC PANEL
BUN / CREAT RATIO: 11 (ref 10–22)
BUN: 13 mg/dL (ref 8–27)
CALCIUM: 9.1 mg/dL (ref 8.6–10.2)
CHLORIDE: 101 mmol/L (ref 97–108)
CO2: 22 mmol/L (ref 18–29)
Creatinine, Ser: 1.19 mg/dL (ref 0.76–1.27)
GFR, EST AFRICAN AMERICAN: 67 mL/min/{1.73_m2} (ref >59)
GFR, EST NON AFRICAN AMERICAN: 58 mL/min/{1.73_m2} — AB (ref >59)
Glucose: 114 mg/dL — ABNORMAL HIGH (ref 65–99)
POTASSIUM: 4.2 mmol/L (ref 3.5–5.2)
Sodium: 141 mmol/L (ref 134–144)

## 2014-05-11 LAB — LIPID PANEL W/O CHOL/HDL RATIO
CHOLESTEROL TOTAL: 117 mg/dL (ref 100–199)
HDL: 55 mg/dL (ref >39)
LDL Calculated: 50 mg/dL (ref 0–99)
Triglycerides: 60 mg/dL (ref 0–149)
VLDL CHOLESTEROL CAL: 12 mg/dL (ref 5–40)

## 2014-05-11 NOTE — Telephone Encounter (Signed)
Encounter complete. 

## 2014-05-15 ENCOUNTER — Encounter: Payer: Self-pay | Admitting: *Deleted

## 2014-05-16 ENCOUNTER — Ambulatory Visit (HOSPITAL_COMMUNITY)
Admission: RE | Admit: 2014-05-16 | Discharge: 2014-05-16 | Disposition: A | Discharge status: Home / Self Care | Payer: Medicare Other | Source: Ambulatory Visit | Attending: Internal Medicine | Admitting: Internal Medicine

## 2014-05-16 DIAGNOSIS — Z87891 Personal history of nicotine dependence: Secondary | ICD-10-CM | POA: Diagnosis not present

## 2014-05-16 DIAGNOSIS — I4891 Unspecified atrial fibrillation: Secondary | ICD-10-CM | POA: Insufficient documentation

## 2014-05-16 DIAGNOSIS — I2510 Atherosclerotic heart disease of native coronary artery without angina pectoris: Secondary | ICD-10-CM | POA: Insufficient documentation

## 2014-05-16 DIAGNOSIS — R0609 Other forms of dyspnea: Secondary | ICD-10-CM | POA: Insufficient documentation

## 2014-05-16 DIAGNOSIS — R42 Dizziness and giddiness: Secondary | ICD-10-CM | POA: Diagnosis not present

## 2014-05-16 DIAGNOSIS — E669 Obesity, unspecified: Secondary | ICD-10-CM | POA: Diagnosis not present

## 2014-05-16 DIAGNOSIS — Z951 Presence of aortocoronary bypass graft: Secondary | ICD-10-CM | POA: Diagnosis not present

## 2014-05-16 DIAGNOSIS — I10 Essential (primary) hypertension: Secondary | ICD-10-CM | POA: Insufficient documentation

## 2014-05-16 DIAGNOSIS — Z6830 Body mass index (BMI) 30.0-30.9, adult: Secondary | ICD-10-CM | POA: Diagnosis not present

## 2014-05-16 DIAGNOSIS — Z8249 Family history of ischemic heart disease and other diseases of the circulatory system: Secondary | ICD-10-CM | POA: Insufficient documentation

## 2014-05-16 DIAGNOSIS — R9431 Abnormal electrocardiogram [ECG] [EKG]: Secondary | ICD-10-CM | POA: Insufficient documentation

## 2014-05-16 MED ORDER — REGADENOSON 0.4 MG/5ML IV SOLN
0.4000 mg | Freq: Once | INTRAVENOUS | Status: AC
  Administered 2014-05-16: 0.4 mg via INTRAVENOUS

## 2014-05-16 MED ORDER — TECHNETIUM TC 99M SESTAMIBI GENERIC - CARDIOLITE
10.7000 | Freq: Once | INTRAVENOUS | Status: AC | PRN
Start: 2014-05-16 — End: 2014-05-16
  Administered 2014-05-16: 11 via INTRAVENOUS

## 2014-05-16 MED ORDER — TECHNETIUM TC 99M SESTAMIBI GENERIC - CARDIOLITE
30.7000 | Freq: Once | INTRAVENOUS | Status: AC | PRN
  Administered 2014-05-16: 30.7 via INTRAVENOUS

## 2014-05-16 NOTE — Procedures (Addendum)
[facility] [address] [place] [address] [phone]  Cardiology Nuclear Med Study  [name] is a 81 y.o. male     MRN : [id number]     DOB: Mar 12, 1933  Procedure Date: 05/16/2014  Nuclear Med Background Indication for Stress Test:  Abnormal EKG and Follow up CAD History:  CAD;CABG X3--2006;STENT;Last NUC MPI on 11/09/2007-normal;EF=70%;AFIB Cardiac Risk Factors: Carotid Disease, Family History - CAD, History of Smoking, Hypertension, Obesity and PVD  Symptoms:  Dizziness, DOE and Light-Headedness   Nuclear Pre-Procedure Caffeine/Decaff Intake:  1:30am NPO After: 9:30AM   IV Site: R Forearm  IV 0.9% NS with Angio Cath:  22g  Chest Size (in):  44" IV Started by: Rolene Course, RN  Height: 5' 9"  (1.753 m)  Cup Size: n/a  BMI:  Body mass index is 30.85 kg/(m^2). Weight:  209 lb (94.802 kg)   Tech Comments:  Pt. Had difficulty walking, test was changed to [place] 1 or 2 day study: 1 day  Stress Test Type:  [place] Provider:  Lyman Bishop, MD   Resting Radionuclide: Technetium 50mSestamibi  Resting Radionuclide Dose: 10.7 mCi   Stress Radionuclide:  Technetium 927mestamibi  Stress Radionuclide Dose: 30.7 mCi           Stress Protocol Rest HR: 51 Stress HR: 71  Rest BP: 148/67 Stress BP: 154/50  Exercise Time (min): n/a METS: n/a          Dose of Adenosine (mg):  n/a Dose of Lexiscan: 0.4 mg  Dose of Atropine (mg): n/a Dose of Dobutamine: n/a mcg/kg/min (at max HR)  Stress Test Technologist: TeLeane ParaCCT Nuclear Technologist:Elizabeth Young,CNMT   Rest Procedure:  Myocardial perfusion imaging was performed at rest 45 minutes following the intravenous administration of Technetium 9914mstamibi. Stress Procedure:  The patient received IV Lexiscan 0.4 mg over 15-seconds.  Technetium 55m32mtamibi injected IV at 30-seconds.  There were no significant  changes with Lexiscan.  Quantitative spect images were obtained after a 45 minute delay.  Transient Ischemic Dilatation (Normal <1.22):  1.09 QGS EDV:  116 ml QGS ESV:  49 ml LV Ejection Fraction: 58%     Rest ECG: Sinus Bradycardia at 55 with mild inferolateral ST changes and Q wave in aVL.  Stress ECG: No significant change from baseline ECG; isolated PVC  QPS Raw Data Images:  Normal; no motion artifact; normal heart/lung ratio. Stress Images:  Normal homogeneous uptake in all areas of the myocardium. Rest Images:  Normal homogeneous uptake in all areas of the myocardium. Subtraction (SDS):  Normal  Impression Exercise Capacity:  Lexiscan with no exercise. BP Response:  Normal blood pressure response. Clinical Symptoms:  Mild shortnes of breath ECG Impression:  No significant ST segment change suggestive of ischemia. Comparison with Prior Nuclear Study: No significant change from previous study  Overall Impression:  Normal stress nuclear study.  LV Wall Motion:  NL LV Function, EF 58%; NL Wall Motion   KELLY,THOMAS A, MD  05/16/2014 5:11 PM

## 2014-07-05 ENCOUNTER — Other Ambulatory Visit: Payer: Self-pay | Admitting: Internal Medicine

## 2014-07-05 NOTE — Telephone Encounter (Signed)
Rx(s) sent to pharmacy electronically.  

## 2014-07-17 DIAGNOSIS — N401 Enlarged prostate with lower urinary tract symptoms: Secondary | ICD-10-CM | POA: Diagnosis not present

## 2014-07-17 DIAGNOSIS — R351 Nocturia: Secondary | ICD-10-CM | POA: Diagnosis not present

## 2014-07-17 DIAGNOSIS — N528 Other male erectile dysfunction: Secondary | ICD-10-CM | POA: Diagnosis not present

## 2014-08-29 DIAGNOSIS — R601 Generalized edema: Secondary | ICD-10-CM | POA: Diagnosis not present

## 2014-08-29 DIAGNOSIS — M79671 Pain in right foot: Secondary | ICD-10-CM | POA: Diagnosis not present

## 2014-08-29 DIAGNOSIS — M25571 Pain in right ankle and joints of right foot: Secondary | ICD-10-CM | POA: Diagnosis not present

## 2014-08-29 DIAGNOSIS — M5440 Lumbago with sciatica, unspecified side: Secondary | ICD-10-CM | POA: Diagnosis not present

## 2014-08-29 DIAGNOSIS — M10071 Idiopathic gout, right ankle and foot: Secondary | ICD-10-CM | POA: Diagnosis not present

## 2014-09-02 DIAGNOSIS — Z23 Encounter for immunization: Secondary | ICD-10-CM | POA: Diagnosis not present

## 2014-09-04 ENCOUNTER — Encounter: Payer: Self-pay | Admitting: Internal Medicine

## 2014-09-04 ENCOUNTER — Telehealth: Payer: Self-pay

## 2014-09-04 NOTE — Telephone Encounter (Signed)
APPOINTMENT MADE AND LETTER SENT °

## 2014-09-04 NOTE — Telephone Encounter (Signed)
Pt came by the office. He was requesting samples, although he could not remember the name of the samples we had given him. After checking his chart, we have not seen the pt since 02/2012. He No showed his last appt and cancelled the appt before that one. Pt is on protonix and we dont have any samples of this medication. I spoke with the pt and advised him that I could not give him any samples at this time and we dont have samples of protonix anyway. Also advised him that he was past due for an office visit. Pt stated he would call us back, he was in a hurry today and didn't have time to make an appt.   Routing to AS for an FYI.  Erline Levine, please schedule ov for pt.

## 2014-10-03 ENCOUNTER — Ambulatory Visit (INDEPENDENT_AMBULATORY_CARE_PROVIDER_SITE_OTHER): Payer: Medicare Other | Admitting: Gastroenterology

## 2014-10-03 ENCOUNTER — Encounter: Payer: Self-pay | Admitting: Gastroenterology

## 2014-10-03 VITALS — BP 180/68 | HR 47 | Temp 97.9°F | Ht 69.0 in | Wt 202.2 lb

## 2014-10-03 DIAGNOSIS — D696 Thrombocytopenia, unspecified: Secondary | ICD-10-CM | POA: Diagnosis not present

## 2014-10-03 DIAGNOSIS — R161 Splenomegaly, not elsewhere classified: Secondary | ICD-10-CM | POA: Diagnosis not present

## 2014-10-03 DIAGNOSIS — K760 Fatty (change of) liver, not elsewhere classified: Secondary | ICD-10-CM | POA: Diagnosis not present

## 2014-10-03 DIAGNOSIS — R140 Abdominal distension (gaseous): Secondary | ICD-10-CM | POA: Diagnosis not present

## 2014-10-03 DIAGNOSIS — K219 Gastro-esophageal reflux disease without esophagitis: Secondary | ICD-10-CM

## 2014-10-03 DIAGNOSIS — I6529 Occlusion and stenosis of unspecified carotid artery: Secondary | ICD-10-CM

## 2014-10-03 LAB — COMPREHENSIVE METABOLIC PANEL
ALT: 28 U/L (ref 9–46)
AST: 32 U/L (ref 10–35)
Albumin: 3.9 g/dL (ref 3.6–5.1)
Alkaline Phosphatase: 80 U/L (ref 40–115)
BUN: 14 mg/dL (ref 7–25)
CO2: 25 mmol/L (ref 20–31)
CREATININE: 1.19 mg/dL — AB (ref 0.70–1.18)
Calcium: 9 mg/dL (ref 8.6–10.3)
Chloride: 105 mmol/L (ref 98–110)
Glucose, Bld: 99 mg/dL (ref 65–99)
Potassium: 4.2 mmol/L (ref 3.5–5.3)
SODIUM: 139 mmol/L (ref 135–146)
Total Bilirubin: 1 mg/dL (ref 0.2–1.2)
Total Protein: 6.5 g/dL (ref 6.1–8.1)

## 2014-10-03 LAB — CBC WITH DIFFERENTIAL/PLATELET
BASOS PCT: 0 % (ref 0–1)
Basophils Absolute: 0 10*3/uL (ref 0.0–0.1)
Eosinophils Absolute: 0.1 10*3/uL (ref 0.0–0.7)
Eosinophils Relative: 3 % (ref 0–5)
HCT: 34.5 % — ABNORMAL LOW (ref 39.0–52.0)
Hemoglobin: 12.1 g/dL — ABNORMAL LOW (ref 13.0–17.0)
Lymphocytes Relative: 27 % (ref 12–46)
Lymphs Abs: 1.1 10*3/uL (ref 0.7–4.0)
MCH: 34.3 pg — ABNORMAL HIGH (ref 26.0–34.0)
MCHC: 35.1 g/dL (ref 30.0–36.0)
MCV: 97.7 fL (ref 78.0–100.0)
MONO ABS: 0.4 10*3/uL (ref 0.1–1.0)
MPV: 11.7 fL (ref 8.6–12.4)
Monocytes Relative: 10 % (ref 3–12)
NEUTROS PCT: 60 % (ref 43–77)
Neutro Abs: 2.5 10*3/uL (ref 1.7–7.7)
PLATELETS: 128 10*3/uL — AB (ref 150–400)
RBC: 3.53 MIL/uL — AB (ref 4.22–5.81)
RDW: 14.4 % (ref 11.5–15.5)
WBC: 4.2 10*3/uL (ref 4.0–10.5)

## 2014-10-03 LAB — PROTIME-INR
INR: 1.14 (ref <1.50)
Prothrombin Time: 14.6 seconds (ref 11.6–15.2)

## 2014-10-03 MED ORDER — ALIGN 4 MG PO CAPS: 4.0000 mg | ORAL_CAPSULE | Freq: Every day | ORAL | Status: DC

## 2014-10-03 NOTE — Progress Notes (Signed)
Primary Care Physician: Glo Herring., MD  Primary Gastroenterologist:  Garfield Cornea, MD   Chief Complaint  Patient presents with  . Follow-up  . Medication Refill    HPI: [name] is a 81 y.o. male here for follow up. Last seen in January 2014. He has a history of GERD, Schatzki ring, fatty liver, thrombocytopenia in the setting of splenomegaly. Colonoscopy and EGD in December 2013 for dysphagia and Hemoccult-positive stool. Benign colonic polyp, pancolonic diverticulosis, benign gastric polyp, Schatzki ring dilated. Followed by hematology for thrombocytopenia, last seen September 2015 however no further follow-up planned by hematology. Last imaging 2013 via CT scan. Mild splenomegaly, mild to moderate hepatic steatosis but no evidence of cirrhosis although liver enlarged at 22 cm. 11 mm partially calcified right renal artery aneurysm not significantly changed from 2007.  We encouraged patient to come in for follow-up visit after he requested samples of pantoprazole. We had not seen him in a couple years. He complains of excessive gas. States he's tried everything over-the-counter for gas. Nothing seems to help. He has been on probiotics in the past. Something from our office helped him before but he cannot recall the name. Digestive advantage seem to help in the past. He has had the symptoms for a couple of years at least. He complained of this back in 2014 as well.  Feels bloated. This is constant. Sometimes worse when he eats. Bowel movements regular. No blood in the stool or melena. Only occasional heartburn in the setting of pantoprazole daily. He's really a difficult historian when it comes to talk about dysphagia. Seems to have no problems swallowing chicken and bread. However he notes that if he gulps cold fluid it tends to come back up. He begins his meals by sipping cold water and then starts eating and seems to do okay. Reports that previous esophageal dilation didn't  help very much although he does have a history of Schatzki ring.   Takes milk thistle daily.  Current Outpatient Prescriptions  Medication Sig Dispense Refill  . allopurinol (ZYLOPRIM) 100 MG tablet Take 100 mg by mouth daily.    Marland Kitchen ALPRAZolam (XANAX) 0.5 MG tablet Take 0.5 mg by mouth at bedtime as needed. Sleep.    Marland Kitchen amLODipine (NORVASC) 5 MG tablet Take 1 tablet (5 mg total) by mouth 2 (two) times daily. 180 tablet 3  . doxazosin (CARDURA XL) 8 MG 24 hr tablet Take 1 tablet (8 mg total) by mouth at bedtime.    . edoxaban (SAVAYSA) 60 MG TABS tablet Take 60 mg by mouth daily. 30 tablet 9  . flunisolide (NASALIDE) 0.025 % SOLN Inhale 2 sprays into the lungs 2 (two) times daily.    Marland Kitchen gabapentin (NEURONTIN) 300 MG capsule Take 300 mg by mouth 3 (three) times daily.      Marland Kitchen levothyroxine (SYNTHROID, LEVOTHROID) 25 MCG tablet Take 50 mcg by mouth daily before breakfast.     . metoprolol tartrate (LOPRESSOR) 25 MG tablet Take 0.5 tablets (12.5 mg total) by mouth 2 (two) times daily. 90 tablet 2  . pantoprazole (PROTONIX) 40 MG tablet Take 40 mg by mouth daily as needed. Acid reflux     No current facility-administered medications for this visit.    Allergies as of 10/03/2014  . (No Known Allergies)   Past Medical History  Diagnosis Date  . CAD (coronary artery disease) of artery bypass graft 2006  . Neuropathy   . GERD (gastroesophageal reflux disease)   .  BPH (benign prostatic hyperplasia)   . Fatty liver   . Schatzki's ring     non critical  . Hiatal hernia   . S/P CABG (coronary artery bypass graft) 2006  . PVD (peripheral vascular disease)     0-49% R & L ICA stenosis (2013)   . History of nuclear stress test 11/2007    bruce myoview; normal pattern of persuion in all regions; post-stress EF 70%; low risk   . Gilbert's syndrome   . Thrombocytopenia   . HTN (hypertension)   . PAF (paroxysmal atrial fibrillation)   . Renal artery aneurysm    Past Surgical History  Procedure  Laterality Date  . Coronary artery bypass graft  04/2004    LIMA to diagonal, SVG to ramus intermedius, SVG to PDA; placed stent for crossed coronary arteries (Dr. Prescott Gum)  . Rotater cuff      repair  . Cataract extraction  2011  . Tonsillectomy    . Esophagogastroduodenoscopy  2006    Dr. Ruthell Rummage Schatzki's ring, s/p 58-F Maloney dilation  . Colonoscopy  2006    Dr. Margarito Courser pancolonic diverticula  . Colonoscopy  01/07/2012    Dr. Abbe Amsterdam normal rectum, pancolonic diverticulosis, hyperplastic polyp  . Esophagogastroduodenoscopy  01/07/2012    Dr. Gareth Morgan ring, hiatal hernia, fundic gland polyp    ROS:  General: Negative for anorexia, weight loss, fever, chills, fatigue, weakness. ENT: Negative for hoarseness,  nasal congestion. CV: Negative for chest pain, angina, palpitations, dyspnea on exertion, peripheral edema.  Respiratory: Negative for dyspnea at rest, dyspnea on exertion, cough, sputum, wheezing.  GI: See history of present illness. GU:  Negative for dysuria, hematuria, urinary incontinence, urinary frequency, nocturnal urination.  Endo: Negative for unusual weight change.    Physical Examination:   BP 194/71 mmHg  Pulse 47  Temp(Src) 97.9 F (36.6 C)  Ht 5' 9"  (1.753 m)  Wt 202 lb 3.2 oz (91.717 kg)  BMI 29.85 kg/m2  General: Well-nourished, well-developed in no acute distress.  Eyes: No icterus. Mouth: Oropharyngeal mucosa moist and pink , no lesions erythema or exudate. Lungs: Clear to auscultation bilaterally.  Heart: Regular rate and rhythm, no murmurs rubs or gallops.  Abdomen: Bowel sounds are normal, nontender, nondistended, did not palpate spleen. Liver enlarged well below RCM in MCL. No masses, no abdominal bruits or hernia , no rebound or guarding.  +rectus diastasis.  Extremities: No lower extremity edema. No clubbing or deformities. Neuro: Alert and oriented x 4   Skin: Warm and dry, no jaundice.   Psych: Alert and  cooperative, normal mood and affect.   Imaging Studies: No results found.  Impression/Plan:  81 year old gentleman with history of chronic GERD, Schatzki ring, fatty liver/splenomegaly who presents for follow-up. Has not been seen in a couple of years. Complains of chronic abdominal bloating, excess gas without any bowel change. Typical GERD well controlled. Given history of hepatosplenomegaly, patient may be feeling bloated related to this. Cannot exclude other etiologies for bloating such as celiac, small bowel bacterial overgrowth.  Initially we will obtain labs to follow-up on fatty liver, thrombocytopenia. Abdominal ultrasound the obtained. Trial of probiotics, Align once daily. Further recommendations to follow. Will not exclude possibility of needing an upper endoscopy for further evaluation of his symptoms. Please note he does take Edoxaban which will need to be held prior to procedure.

## 2014-10-03 NOTE — Patient Instructions (Signed)
   Labs and ultrasound as ordered.  Start Align one daily for 3 weeks. Samples provided.   Based on results, you may need further work up of your gas/bloating.   Bloating Bloating is the feeling of fullness in your belly. You may feel as though your pants are too tight. Often the cause of bloating is overeating, retaining fluids, or having gas in your bowel. It is also caused by swallowing air and eating foods that cause gas. Irritable bowel syndrome is one of the most common causes of bloating. Constipation is also a common cause. Sometimes more serious problems can cause bloating. SYMPTOMS  Usually there is a feeling of fullness, as though your abdomen is bulged out. There may be mild discomfort.  DIAGNOSIS  Usually no particular testing is necessary for most bloating. If the condition persists and seems to become worse, your caregiver may do additional testing.  TREATMENT   There is no direct treatment for bloating.  Do not put gas into the bowel. Avoid chewing gum and sucking on candy. These tend to make you swallow air. Swallowing air can also be a nervous habit. Try to avoid this.  Avoiding high residue diets will help. Eat foods with soluble fibers (examples include root vegetables, apples, or barley) and substitute dairy products with soy and rice products. This helps irritable bowel syndrome.  If constipation is the cause, then a high residue diet with more fiber will help.  Avoid carbonated beverages.  Over-the-counter preparations are available that help reduce gas. Your pharmacist can help you with this. SEEK MEDICAL CARE IF:   Bloating continues and seems to be getting worse.  You notice a weight gain.  You have a weight loss but the bloating is getting worse.  You have changes in your bowel habits or develop nausea or vomiting. SEEK IMMEDIATE MEDICAL CARE IF:   You develop shortness of breath or swelling in your legs.  You have an increase in abdominal pain or  develop chest pain. Document Released: 11/20/2005 Document Revised: 04/14/2011 Document Reviewed: 01/08/2007 [facility] Patient Information 2015 [place], [place]. This information is not intended to replace advice given to you by your health care provider. Make sure you discuss any questions you have with your health care provider.

## 2014-10-03 NOTE — Progress Notes (Signed)
CC'ED TO PCP 

## 2014-10-04 ENCOUNTER — Telehealth: Payer: Self-pay | Admitting: Internal Medicine

## 2014-10-04 LAB — TISSUE TRANSGLUTAMINASE, IGA: Tissue Transglutaminase Ab, IgA: 1 U/mL (ref <4)

## 2014-10-04 NOTE — Telephone Encounter (Signed)
Patient called to say he had labs done yesterday and when we get labs to fax them to [facility].

## 2014-10-06 ENCOUNTER — Ambulatory Visit (HOSPITAL_COMMUNITY)
Admission: RE | Admit: 2014-10-06 | Discharge: 2014-10-06 | Disposition: A | Discharge status: Home / Self Care | Payer: Medicare Other | Source: Ambulatory Visit | Attending: Gastroenterology | Admitting: Gastroenterology

## 2014-10-06 DIAGNOSIS — K219 Gastro-esophageal reflux disease without esophagitis: Secondary | ICD-10-CM

## 2014-10-06 DIAGNOSIS — K760 Fatty (change of) liver, not elsewhere classified: Secondary | ICD-10-CM | POA: Insufficient documentation

## 2014-10-06 DIAGNOSIS — R140 Abdominal distension (gaseous): Secondary | ICD-10-CM | POA: Insufficient documentation

## 2014-10-06 DIAGNOSIS — R161 Splenomegaly, not elsewhere classified: Secondary | ICD-10-CM | POA: Insufficient documentation

## 2014-10-06 DIAGNOSIS — D696 Thrombocytopenia, unspecified: Secondary | ICD-10-CM

## 2014-10-10 DIAGNOSIS — L821 Other seborrheic keratosis: Secondary | ICD-10-CM | POA: Diagnosis not present

## 2014-10-10 DIAGNOSIS — D225 Melanocytic nevi of trunk: Secondary | ICD-10-CM | POA: Diagnosis not present

## 2014-10-10 DIAGNOSIS — L570 Actinic keratosis: Secondary | ICD-10-CM | POA: Diagnosis not present

## 2014-10-10 DIAGNOSIS — D485 Neoplasm of uncertain behavior of skin: Secondary | ICD-10-CM | POA: Diagnosis not present

## 2014-10-10 NOTE — Telephone Encounter (Signed)
Labs faxed to PCP per patient request

## 2014-10-18 ENCOUNTER — Telehealth: Payer: Self-pay

## 2014-10-18 DIAGNOSIS — Z1389 Encounter for screening for other disorder: Secondary | ICD-10-CM | POA: Diagnosis not present

## 2014-10-18 DIAGNOSIS — E6609 Other obesity due to excess calories: Secondary | ICD-10-CM | POA: Diagnosis not present

## 2014-10-18 DIAGNOSIS — E538 Deficiency of other specified B group vitamins: Secondary | ICD-10-CM | POA: Diagnosis not present

## 2014-10-18 DIAGNOSIS — E559 Vitamin D deficiency, unspecified: Secondary | ICD-10-CM | POA: Diagnosis not present

## 2014-10-18 DIAGNOSIS — M109 Gout, unspecified: Secondary | ICD-10-CM | POA: Diagnosis not present

## 2014-10-18 DIAGNOSIS — Z6830 Body mass index (BMI) 30.0-30.9, adult: Secondary | ICD-10-CM | POA: Diagnosis not present

## 2014-10-18 DIAGNOSIS — E782 Mixed hyperlipidemia: Secondary | ICD-10-CM | POA: Diagnosis not present

## 2014-10-18 DIAGNOSIS — E039 Hypothyroidism, unspecified: Secondary | ICD-10-CM | POA: Diagnosis not present

## 2014-10-18 DIAGNOSIS — I10 Essential (primary) hypertension: Secondary | ICD-10-CM | POA: Diagnosis not present

## 2014-10-18 NOTE — Telephone Encounter (Signed)
Routing to RMR for lab results.

## 2014-10-18 NOTE — Telephone Encounter (Signed)
Pt called and is inquiring about labs done. Please advise

## 2014-10-26 ENCOUNTER — Encounter: Payer: Self-pay | Admitting: Internal Medicine

## 2014-10-26 NOTE — Progress Notes (Signed)
APPT MADE AND LETTER SENT  °

## 2014-10-26 NOTE — Progress Notes (Signed)
Quick Note:  No evidence of celiac. Creatinine minimally elevated. Mild anemia and stable thrombocytopenia. Liver function good.  Abd u/s with fatty liver and enlarged spleen.  Would recommend ifobt.  Repeat CBC in four weeks along with iron/tibc, ferritin. Return to see RMR only in four weeks.    ______

## 2014-10-31 ENCOUNTER — Other Ambulatory Visit: Payer: Self-pay | Admitting: Gastroenterology

## 2014-10-31 DIAGNOSIS — D696 Thrombocytopenia, unspecified: Secondary | ICD-10-CM

## 2014-11-06 ENCOUNTER — Telehealth: Payer: Self-pay | Admitting: Gastroenterology

## 2014-11-06 NOTE — Telephone Encounter (Signed)
Please let patient know that upon review of his records I note that he has h/o renal artery aneurysm on CT couple of years ago (2013).   He should consider follow up with his cardiologist for this at next [facility]. Please send copy of CT report from 09/01/11 to patient to take with him to his next cardiology OV.

## 2014-11-09 DIAGNOSIS — M79671 Pain in right foot: Secondary | ICD-10-CM | POA: Diagnosis not present

## 2014-11-09 DIAGNOSIS — M25579 Pain in unspecified ankle and joints of unspecified foot: Secondary | ICD-10-CM | POA: Diagnosis not present

## 2014-11-09 DIAGNOSIS — M1990 Unspecified osteoarthritis, unspecified site: Secondary | ICD-10-CM | POA: Diagnosis not present

## 2014-11-09 DIAGNOSIS — M79674 Pain in right toe(s): Secondary | ICD-10-CM | POA: Diagnosis not present

## 2014-11-10 ENCOUNTER — Other Ambulatory Visit: Payer: Self-pay

## 2014-11-10 ENCOUNTER — Other Ambulatory Visit: Payer: Self-pay | Admitting: Gastroenterology

## 2014-11-10 ENCOUNTER — Telehealth: Payer: Self-pay | Admitting: Internal Medicine

## 2014-11-10 DIAGNOSIS — D696 Thrombocytopenia, unspecified: Secondary | ICD-10-CM

## 2014-11-13 NOTE — Telephone Encounter (Signed)
Close encounter 

## 2014-11-16 NOTE — Telephone Encounter (Signed)
Pt is aware. He is going to see Dr.Hilty on 11/24/14. I will forward this note to Dr.Hilty.

## 2014-11-20 ENCOUNTER — Other Ambulatory Visit: Payer: Self-pay | Admitting: Internal Medicine

## 2014-11-21 DIAGNOSIS — Z23 Encounter for immunization: Secondary | ICD-10-CM | POA: Diagnosis not present

## 2014-11-24 ENCOUNTER — Encounter: Payer: Self-pay | Admitting: Internal Medicine

## 2014-11-24 ENCOUNTER — Ambulatory Visit (INDEPENDENT_AMBULATORY_CARE_PROVIDER_SITE_OTHER): Payer: Medicare Other | Admitting: Internal Medicine

## 2014-11-24 VITALS — BP 132/56 | HR 52 | Ht 69.0 in | Wt 202.5 lb

## 2014-11-24 DIAGNOSIS — I6529 Occlusion and stenosis of unspecified carotid artery: Secondary | ICD-10-CM | POA: Diagnosis not present

## 2014-11-24 DIAGNOSIS — I722 Aneurysm of renal artery: Secondary | ICD-10-CM | POA: Diagnosis not present

## 2014-11-24 DIAGNOSIS — R531 Weakness: Secondary | ICD-10-CM

## 2014-11-24 DIAGNOSIS — Z951 Presence of aortocoronary bypass graft: Secondary | ICD-10-CM | POA: Diagnosis not present

## 2014-11-24 DIAGNOSIS — I4891 Unspecified atrial fibrillation: Secondary | ICD-10-CM

## 2014-11-24 DIAGNOSIS — I739 Peripheral vascular disease, unspecified: Secondary | ICD-10-CM

## 2014-11-24 DIAGNOSIS — I10 Essential (primary) hypertension: Secondary | ICD-10-CM

## 2014-11-24 NOTE — Patient Instructions (Addendum)
Your physician has requested that you have a renal artery duplex. During this test, an ultrasound is used to evaluate blood flow to the kidneys. Allow one hour for this exam. Do not eat after midnight the day before and avoid carbonated beverages. Take your medications as you usually do.  Your physician has requested that you have a lower extremity arterial duplex. This test is an ultrasound of the arteries in the legs. It looks at arterial blood flow in the legs. Allow one hour for Lower Arterial scans. There are no restrictions or special instructions  Your physician wants you to follow-up in: 6 months with Dr. Debara Pickett. You will receive a reminder letter in the mail two months in advance. If you don't receive a letter, please call our office to schedule the follow-up appointment.  If you need a refill on your cardiac medications before your next appointment, please call your pharmacy.  savaysa samples - 5 packs - given to patient

## 2014-11-26 DIAGNOSIS — I722 Aneurysm of renal artery: Secondary | ICD-10-CM | POA: Insufficient documentation

## 2014-11-26 NOTE — Progress Notes (Signed)
OFFICE NOTE  Chief Complaint:  Legs are bothering him  Primary Care Physician: Rocky Morel, MD  HPI:  [name] is a 81 year old gentleman who had bypass in 2006, a normal nuclear study in 2009 and has done well without any episodes of angina. He has remained active and does exercise several times a week and has had no worsening shortness of breath, palpitations, presyncope or syncopal symptoms. He also has a history of right carotid disease which he was told to be about 60% at the time of bypass, however, recent Dopplers show only mild disease. He does have a very faint bruit. He last saw Shane Fuller, PA-C, in the office this past summer. He was having problems with elevated blood pressures at night. It was recommended that he change his blood pressure medications around, however he did not make that change.  He was recently admitted to [facility] for dehydration in the setting of pneumonia or viral influenza. He was taking his diuretics in addition to ongoing fluid losses. This cause orthostatic hypotension. His diuretic was held and his lisinopril was stopped due to worsening renal function. He was switched to amlodipine for better blood pressure control, but has not been taking the medicine due to confusion of his medicines. Currently reports his blood pressures have improved and is not have any significant swelling. In fact he is now hypertensive again. He is recently switched from warfarin over to Xarelto for better control of anticoagulation for his A. fib. He seems to be able to get the medication now with out undue cost.  [name] turns today for followup. He occasionally gets some lightheadedness. He's noted some blood pressures up into the 190s at home. He says however he feels bad when his blood pressure is more in the 120s to 130s.  I saw [name] back today in the office. Overall he reports doing fairly well. He decreased his exercise somewhat due to his wife  being sick but is interested in getting back into exercise routine. His last stress test was 7 years ago. His bypass was now 10 years ago. He denies any chest pain or worsening shortness of breath. Blood pressure is running somewhat high. His diuretics were stopped due to worsening renal function which seems to have normalized.  [name] returns today for follow-up. His main complaint has to do with pain in his back and decreased function in his legs. He says it's very difficult for him to get around. Although he has pain in his legs his symptoms are not necessarily consistent with claudication. He does have a history of decreased ABIs in the past and has not had lower extremity arterial Dopplers in some time. I was also recently reminded that he does have a history of renal artery aneurysm which was noted on the CT scan. This has not been reassessed in several years. He denies any significant symptoms with atrial fibrillation has had no bleeding problems on Savaysa.  PMHx:  Past Medical History  Diagnosis Date  . CAD (coronary artery disease) of artery bypass graft 2006  . Neuropathy ([place])   . GERD (gastroesophageal reflux disease)   . BPH (benign prostatic hyperplasia)   . Fatty liver   . Schatzki's ring     non critical  . Hiatal hernia   . S/P CABG (coronary artery bypass graft) 2006  . PVD (peripheral vascular disease) ([place])     0-49% R & L ICA stenosis (2013)   . History  of nuclear stress test 11/2007    bruce myoview; normal pattern of persuion in all regions; post-stress EF 70%; low risk   . Gilbert's syndrome   . Thrombocytopenia ([place])   . HTN (hypertension)   . PAF (paroxysmal atrial fibrillation) ([place])   . Renal artery aneurysm [facility])     Past Surgical History  Procedure Laterality Date  . Coronary artery bypass graft  04/2004    LIMA to diagonal, SVG to ramus intermedius, SVG to PDA; placed stent for crossed coronary arteries (Dr. Prescott Gum)  . Rotater cuff      repair  .  Cataract extraction  2011  . Tonsillectomy    . Esophagogastroduodenoscopy  2006    Dr. Ruthell Rummage Schatzki's ring, s/p 58-F Maloney dilation  . Colonoscopy  2006    Dr. Margarito Courser pancolonic diverticula  . Colonoscopy  01/07/2012    Dr. Abbe Amsterdam normal rectum, pancolonic diverticulosis, hyperplastic polyp  . Esophagogastroduodenoscopy  01/07/2012    Dr. Gareth Morgan ring, hiatal hernia, fundic gland polyp    FAMHx:  Family History  Problem Relation Age of Onset  . Heart attack Mother 30    deceased  . Heart attack Father 89    deceased  . Colon cancer Neg Hx   . Heart disease Brother     x2  . Diabetes Brother     x3    SOCHx:   reports that he quit smoking about 10 years ago. His smoking use included Cigarettes. He has a 40 pack-year smoking history. He has never used smokeless tobacco. He reports that he drinks alcohol. He reports that he does not use illicit drugs.  ALLERGIES:  No Known Allergies  ROS: A comprehensive review of systems was negative except for: Musculoskeletal: positive for muscle weakness  HOME MEDS: Current Outpatient Prescriptions  Medication Sig Dispense Refill  . allopurinol (ZYLOPRIM) 100 MG tablet Take 100 mg by mouth daily.    Marland Kitchen ALPRAZolam (XANAX) 0.5 MG tablet Take 0.5 mg by mouth at bedtime as needed. Sleep.    Marland Kitchen amLODipine (NORVASC) 5 MG tablet Take 1 tablet (5 mg total) by mouth 2 (two) times daily. 180 tablet 3  . doxazosin (CARDURA XL) 8 MG 24 hr tablet Take 1 tablet (8 mg total) by mouth at bedtime.    . edoxaban (SAVAYSA) 60 MG TABS tablet Take 60 mg by mouth daily. 30 tablet 9  . flunisolide (NASALIDE) 0.025 % SOLN Inhale 2 sprays into the lungs 2 (two) times daily.    Marland Kitchen gabapentin (NEURONTIN) 300 MG capsule Take 300 mg by mouth 3 (three) times daily.      Marland Kitchen levothyroxine (SYNTHROID, LEVOTHROID) 25 MCG tablet Take 50 mcg by mouth daily before breakfast.     . metoprolol tartrate (LOPRESSOR) 25 MG tablet TAKE  ONE-HALF TABLET BY MOUTH TWICE DAILY 90 tablet 1  . pantoprazole (PROTONIX) 40 MG tablet Take 40 mg by mouth daily as needed. Acid reflux    . Probiotic Product (ALIGN) 4 MG CAPS Take 4 mg by mouth daily. 21 capsule 0   No current facility-administered medications for this visit.    LABS/IMAGING: No results found for this or any previous visit (from the past 48 hour(s)). No results found.  VITALS: BP 132/56 mmHg  Pulse 52  Ht 5' 9"  (1.753 m)  Wt 202 lb 8 oz (91.853 kg)  BMI 29.89 kg/m2  EXAM: General appearance: alert and no distress Neck: no carotid bruit and no JVD Lungs: clear to auscultation bilaterally Heart:  regular rate and rhythm, S1, S2 normal, no murmur, click, rub or gallop Abdomen: soft, non-tender; bowel sounds normal; no masses,  no organomegaly Extremities: extremities normal, atraumatic, no cyanosis or edema Pulses: 2+ and symmetric Skin: Skin color, texture, turgor normal. No rashes or lesions Neurologic: Grossly normal Psych: Mood, affect normal  EKG: Sinus bradycardia with first degree AV block at 52  ASSESSMENT: 1. Coronary artery disease status post three-vessel CABG in 2006 (LIMA to diagonal, SVG to PDA and SVG to ramus intermedius) 2. Hypertension-uncontrolled 3. Mild carotid artery disease 4. Mild dyspnea with marked exertion 5. PAF-on Savaysa 6. Leg pain and weakness with walking 7. Renal Artery aneurysm  PLAN: 1.   [name] is reporting some leg pain with walking. I think this is mostly coming from the back although he does have pain in both hamstrings with seem to be fairly tight. I'll go ahead and check lower chimney arterial Dopplers today due to his history of a decreased ABIs in the past. In addition will check an ultrasound of the renal arteries to assess for his previously described renal artery aneurysm. He seems to be stable with regards to A. fib and is in a sinus rhythm today. He is having no bleeding problems on anticoagulation. Blood  pressure is well-controlled. Plan to see him back in 6 months.  Pixie Casino, MD, [facility] Attending Cardiologist [place] 11/26/2014, 1:28 PM

## 2014-11-28 ENCOUNTER — Ambulatory Visit (INDEPENDENT_AMBULATORY_CARE_PROVIDER_SITE_OTHER): Payer: Medicare Other | Admitting: Internal Medicine

## 2014-11-28 ENCOUNTER — Encounter: Payer: Self-pay | Admitting: Internal Medicine

## 2014-11-28 VITALS — BP 136/57 | HR 60 | Temp 97.8°F | Ht 69.0 in | Wt 206.2 lb

## 2014-11-28 DIAGNOSIS — K760 Fatty (change of) liver, not elsewhere classified: Secondary | ICD-10-CM

## 2014-11-28 DIAGNOSIS — K219 Gastro-esophageal reflux disease without esophagitis: Secondary | ICD-10-CM | POA: Diagnosis not present

## 2014-11-28 DIAGNOSIS — R140 Abdominal distension (gaseous): Secondary | ICD-10-CM

## 2014-11-28 DIAGNOSIS — I6529 Occlusion and stenosis of unspecified carotid artery: Secondary | ICD-10-CM

## 2014-11-28 DIAGNOSIS — R160 Hepatomegaly, not elsewhere classified: Secondary | ICD-10-CM | POA: Diagnosis not present

## 2014-11-28 NOTE — Patient Instructions (Signed)
Continue pantoprazole 40 mg daily  Continue probiotic daily  Office visit with [place] in 6 months  Schedule elastography just before 6 month follow-up appt.

## 2014-11-28 NOTE — Progress Notes (Signed)
Primary Care Physician:  Rocky Morel, MD Primary Gastroenterologist:  Dr. Gala Romney  Pre-Procedure History & Physical: HPI:  [name] is a 81 y.o. male here for follow-up of fatty liver and thrombocytopenia. Also history GERD and Schatzki's ring. No dysphagia. Bloating resolved with probiotic. Reflux symptoms well controlled on Protonix. Ultrasound reveals fatty liver and splenomegaly. Sees Dr. Tressie Stalker in the past. No stigmata of portal hypertension on prior EGD; ultrasound or CT to hasn't had any melena or rectal bleeding. Celiac panel negative. Mildly anemic. Did not return stool cards for occult blood testing or get iron studies done.  Past Medical History  Diagnosis Date  . CAD (coronary artery disease) of artery bypass graft 2006  . Neuropathy ([place])   . GERD (gastroesophageal reflux disease)   . BPH (benign prostatic hyperplasia)   . Fatty liver   . Schatzki's ring     non critical  . Hiatal hernia   . S/P CABG (coronary artery bypass graft) 2006  . PVD (peripheral vascular disease) ([place])     0-49% R & L ICA stenosis (2013)   . History of nuclear stress test 11/2007    bruce myoview; normal pattern of persuion in all regions; post-stress EF 70%; low risk   . Gilbert's syndrome   . Thrombocytopenia ([place])   . HTN (hypertension)   . PAF (paroxysmal atrial fibrillation) ([place])   . Renal artery aneurysm [facility])     Past Surgical History  Procedure Laterality Date  . Coronary artery bypass graft  04/2004    LIMA to diagonal, SVG to ramus intermedius, SVG to PDA; placed stent for crossed coronary arteries (Dr. Prescott Gum)  . Rotater cuff      repair  . Cataract extraction  2011  . Tonsillectomy    . Esophagogastroduodenoscopy  2006    Dr. Ruthell Rummage Schatzki's ring, s/p 58-F Maloney dilation  . Colonoscopy  2006    Dr. Margarito Courser pancolonic diverticula  . Colonoscopy  01/07/2012    Dr. Abbe Amsterdam normal rectum, pancolonic diverticulosis,  hyperplastic polyp  . Esophagogastroduodenoscopy  01/07/2012    Dr. Gareth Morgan ring, hiatal hernia, fundic gland polyp    Prior to Admission medications   Medication Sig Start Date End Date Taking? Authorizing Provider  ALPRAZolam Duanne Moron) 0.5 MG tablet Take 0.5 mg by mouth at bedtime as needed. Sleep.   Yes Historical Provider, MD  amLODipine (NORVASC) 5 MG tablet Take 1 tablet (5 mg total) by mouth 2 (two) times daily. 10/07/13  Yes Pixie Casino, MD  doxazosin (CARDURA XL) 8 MG 24 hr tablet Take 1 tablet (8 mg total) by mouth at bedtime. 04/17/13  Yes Kinnie Feil, MD  edoxaban (SAVAYSA) 60 MG TABS tablet Take 60 mg by mouth daily. 07/05/14  Yes Pixie Casino, MD  flunisolide (NASALIDE) 0.025 % SOLN Inhale 2 sprays into the lungs 2 (two) times daily.   Yes Historical Provider, MD  gabapentin (NEURONTIN) 300 MG capsule Take 300 mg by mouth 3 (three) times daily.     Yes Historical Provider, MD  levothyroxine (SYNTHROID, LEVOTHROID) 25 MCG tablet Take 50 mcg by mouth daily before breakfast.    Yes Historical Provider, MD  metoprolol tartrate (LOPRESSOR) 25 MG tablet TAKE ONE-HALF TABLET BY MOUTH TWICE DAILY 11/20/14  Yes Pixie Casino, MD  pantoprazole (PROTONIX) 40 MG tablet Take 40 mg by mouth daily as needed. Acid reflux   Yes Historical Provider, MD  Probiotic Product (ALIGN) 4 MG CAPS Take 4 mg  by mouth daily. 10/03/14  Yes Mahala Menghini, PA-C    Allergies as of 11/28/2014  . (No Known Allergies)    Family History  Problem Relation Age of Onset  . Heart attack Mother 45    deceased  . Heart attack Father 55    deceased  . Colon cancer Neg Hx   . Heart disease Brother     x2  . Diabetes Brother     x3    Social History   Social History  . Marital Status: Married    Spouse Name: N/A  . Number of Children: N/A  . Years of Education: N/A   Occupational History  . Not on file.   Social History Main Topics  . Smoking status: Former Smoker -- 1.00 packs/day for  40 years    Types: Cigarettes    Quit date: 04/07/2004  . Smokeless tobacco: Never Used  . Alcohol Use: Yes     Comment: occassional drinker  . Drug Use: No  . Sexual Activity: No   Other Topics Concern  . Not on file   Social History Narrative    Review of Systems: See HPI, otherwise negative ROS  Physical Exam: BP 136/57 mmHg  Pulse 60  Temp(Src) 97.8 F (36.6 C) (Oral)  Ht 5' 9"  (1.753 m)  Wt 206 lb 3.2 oz (93.532 kg)  BMI 30.44 kg/m2 General:   Alert, , pleasant and cooperative in NAD Skin:  Intact without significant lesions or rashes. Eyes:  Sclera clear, no icterus.   Conjunctiva pink. Ears:  Normal auditory acuity. Nose:  No deformity, discharge,  or lesions. Mouth:  No deformity or lesions. Neck:  Supple; no masses or thyromegaly. No significant cervical adenopathy. Lungs:  Clear throughout to auscultation.   No wheezes, crackles, or rhonchi. No acute distress. Heart:  Regular rate and rhythm; no murmurs, clicks, rubs,  or gallops. Abdomen: obese. Diastases recti present. Spleen not palpable. Liver edge below the right costal margin-indistinct. Pulses:  Normal pulses noted. Extremities:  Without clubbing or edema.  Impression:  Pleasant 81 year old gentleman with GERD/schatzkis ring - doing well. No dysphagia; reflux well controlled. Bloating better with probiotic therapy. No bowel symptoms at this time. Long-standing hepatosplenomegaly fatty liver on ultrasound. He has  Not gotten  iron studies or stool for occult blood testing as of yet as previously ordered. Celiac screen negative. Nothing to suggest portal hypertension on prior EGD/CT/ultrasound. Fatty liver could be due to [place] ( exposure in the past). Thrombocytopenia.   Recommendations:  Weight loss, regular exercise. We'll get stool for occult blood testing as well as iron studies previously recommended. Pursue baseline elastography just before he returns for office visit six-month  Notice: This  dictation was prepared with Dragon dictation along with smaller phrase technology. Any transcriptional errors that result from this process are unintentional and may not be corrected upon review.

## 2014-12-12 ENCOUNTER — Telehealth: Payer: Self-pay | Admitting: Internal Medicine

## 2014-12-12 NOTE — Telephone Encounter (Signed)
Patient needs another ifobt kit mailed to him

## 2014-12-12 NOTE — Telephone Encounter (Signed)
ifobt in the mail to the pt.

## 2014-12-15 ENCOUNTER — Other Ambulatory Visit: Payer: Self-pay | Admitting: Internal Medicine

## 2014-12-15 ENCOUNTER — Ambulatory Visit (HOSPITAL_COMMUNITY)
Admission: RE | Admit: 2014-12-15 | Discharge: 2014-12-15 | Disposition: A | Discharge status: Home / Self Care | Payer: Medicare Other | Source: Ambulatory Visit | Attending: Cardiology | Admitting: Cardiology

## 2014-12-15 ENCOUNTER — Ambulatory Visit (HOSPITAL_BASED_OUTPATIENT_CLINIC_OR_DEPARTMENT_OTHER)
Admission: RE | Admit: 2014-12-15 | Discharge: 2014-12-15 | Disposition: A | Discharge status: Home / Self Care | Payer: Medicare Other | Source: Ambulatory Visit | Attending: Internal Medicine | Admitting: Internal Medicine

## 2014-12-15 DIAGNOSIS — I739 Peripheral vascular disease, unspecified: Secondary | ICD-10-CM

## 2014-12-15 DIAGNOSIS — I722 Aneurysm of renal artery: Secondary | ICD-10-CM | POA: Insufficient documentation

## 2014-12-15 DIAGNOSIS — I10 Essential (primary) hypertension: Secondary | ICD-10-CM | POA: Insufficient documentation

## 2014-12-20 ENCOUNTER — Telehealth: Payer: Self-pay | Admitting: Internal Medicine

## 2014-12-20 NOTE — Telephone Encounter (Signed)
Pt is returning the nurse's call about some results from his vascular test that was done on 11/11. Please f/u with him  Thanks

## 2014-12-20 NOTE — Telephone Encounter (Signed)
Results given to patient, who verbalized understanding.

## 2015-01-01 ENCOUNTER — Ambulatory Visit (INDEPENDENT_AMBULATORY_CARE_PROVIDER_SITE_OTHER): Payer: Medicare Other

## 2015-01-01 DIAGNOSIS — D649 Anemia, unspecified: Secondary | ICD-10-CM

## 2015-01-02 LAB — IFOBT (OCCULT BLOOD): IFOBT: POSITIVE

## 2015-01-02 NOTE — Progress Notes (Signed)
Pt returned ifobt and it was positive.

## 2015-01-10 NOTE — Progress Notes (Signed)
Quick Note:  ifobt positive. Please remind patient to have his labs that were due 11/2014. ______

## 2015-01-16 DIAGNOSIS — M545 Low back pain: Secondary | ICD-10-CM | POA: Diagnosis not present

## 2015-01-16 DIAGNOSIS — R7309 Other abnormal glucose: Secondary | ICD-10-CM | POA: Diagnosis not present

## 2015-01-16 DIAGNOSIS — I10 Essential (primary) hypertension: Secondary | ICD-10-CM | POA: Diagnosis not present

## 2015-01-16 DIAGNOSIS — R5383 Other fatigue: Secondary | ICD-10-CM | POA: Diagnosis not present

## 2015-01-16 DIAGNOSIS — D649 Anemia, unspecified: Secondary | ICD-10-CM | POA: Diagnosis not present

## 2015-01-16 DIAGNOSIS — E782 Mixed hyperlipidemia: Secondary | ICD-10-CM | POA: Diagnosis not present

## 2015-01-16 DIAGNOSIS — Z6830 Body mass index (BMI) 30.0-30.9, adult: Secondary | ICD-10-CM | POA: Diagnosis not present

## 2015-01-16 DIAGNOSIS — Z1389 Encounter for screening for other disorder: Secondary | ICD-10-CM | POA: Diagnosis not present

## 2015-01-22 DIAGNOSIS — M546 Pain in thoracic spine: Secondary | ICD-10-CM | POA: Diagnosis not present

## 2015-01-22 DIAGNOSIS — M9903 Segmental and somatic dysfunction of lumbar region: Secondary | ICD-10-CM | POA: Diagnosis not present

## 2015-01-22 DIAGNOSIS — M545 Low back pain: Secondary | ICD-10-CM | POA: Diagnosis not present

## 2015-01-22 DIAGNOSIS — M47816 Spondylosis without myelopathy or radiculopathy, lumbar region: Secondary | ICD-10-CM | POA: Diagnosis not present

## 2015-01-23 ENCOUNTER — Telehealth: Payer: Self-pay | Admitting: Internal Medicine

## 2015-01-23 ENCOUNTER — Encounter: Payer: Self-pay | Admitting: Internal Medicine

## 2015-01-23 NOTE — Telephone Encounter (Signed)
Received outside labs from [facility]. Hemoglobin slightly low at 12.5 hematocrit 35.2 platelets 131,000; LFTs completely normal albumin 4.5  Hemoccult-positive. Hepatosplenomegaly. Fatty liver on ultrasound. Elastography previously recommended.   Patient needs study to assess liver fibrosis. He may need EGD for variceal screening. Hemoccult-positive status needs to be evaluated further. He needs an office visit next month with extender

## 2015-01-23 NOTE — Telephone Encounter (Signed)
APPT MADE AND LETTER SENT  °

## 2015-01-24 ENCOUNTER — Other Ambulatory Visit: Payer: Self-pay | Admitting: Internal Medicine

## 2015-01-24 DIAGNOSIS — M47816 Spondylosis without myelopathy or radiculopathy, lumbar region: Secondary | ICD-10-CM | POA: Diagnosis not present

## 2015-01-24 DIAGNOSIS — M546 Pain in thoracic spine: Secondary | ICD-10-CM | POA: Diagnosis not present

## 2015-01-24 DIAGNOSIS — M545 Low back pain: Secondary | ICD-10-CM | POA: Diagnosis not present

## 2015-01-24 DIAGNOSIS — M9903 Segmental and somatic dysfunction of lumbar region: Secondary | ICD-10-CM | POA: Diagnosis not present

## 2015-01-24 NOTE — Telephone Encounter (Signed)
Patient calling the office for samples of medication:   1.  What medication and dosage are you requesting samples for? Savaysa 57m   2.  Are you currently out of this medication? Yes

## 2015-01-25 DIAGNOSIS — M79671 Pain in right foot: Secondary | ICD-10-CM | POA: Diagnosis not present

## 2015-01-25 DIAGNOSIS — M10071 Idiopathic gout, right ankle and foot: Secondary | ICD-10-CM | POA: Diagnosis not present

## 2015-01-25 MED ORDER — EDOXABAN TOSYLATE 60 MG PO TABS: 60.0000 mg | ORAL_TABLET | Freq: Every day | ORAL | Status: DC

## 2015-01-25 NOTE — Telephone Encounter (Signed)
Samples available for pickup  Patient aware

## 2015-01-26 DIAGNOSIS — M546 Pain in thoracic spine: Secondary | ICD-10-CM | POA: Diagnosis not present

## 2015-01-26 DIAGNOSIS — M47816 Spondylosis without myelopathy or radiculopathy, lumbar region: Secondary | ICD-10-CM | POA: Diagnosis not present

## 2015-01-26 DIAGNOSIS — M9903 Segmental and somatic dysfunction of lumbar region: Secondary | ICD-10-CM | POA: Diagnosis not present

## 2015-01-26 DIAGNOSIS — M545 Low back pain: Secondary | ICD-10-CM | POA: Diagnosis not present

## 2015-01-30 DIAGNOSIS — M9903 Segmental and somatic dysfunction of lumbar region: Secondary | ICD-10-CM | POA: Diagnosis not present

## 2015-01-30 DIAGNOSIS — M47816 Spondylosis without myelopathy or radiculopathy, lumbar region: Secondary | ICD-10-CM | POA: Diagnosis not present

## 2015-01-30 DIAGNOSIS — M545 Low back pain: Secondary | ICD-10-CM | POA: Diagnosis not present

## 2015-01-30 DIAGNOSIS — M546 Pain in thoracic spine: Secondary | ICD-10-CM | POA: Diagnosis not present

## 2015-02-06 DIAGNOSIS — M9903 Segmental and somatic dysfunction of lumbar region: Secondary | ICD-10-CM | POA: Diagnosis not present

## 2015-02-06 DIAGNOSIS — M546 Pain in thoracic spine: Secondary | ICD-10-CM | POA: Diagnosis not present

## 2015-02-06 DIAGNOSIS — M47816 Spondylosis without myelopathy or radiculopathy, lumbar region: Secondary | ICD-10-CM | POA: Diagnosis not present

## 2015-02-06 DIAGNOSIS — M545 Low back pain: Secondary | ICD-10-CM | POA: Diagnosis not present

## 2015-02-13 ENCOUNTER — Ambulatory Visit (INDEPENDENT_AMBULATORY_CARE_PROVIDER_SITE_OTHER): Payer: Medicare Other | Admitting: Gastroenterology

## 2015-02-13 ENCOUNTER — Telehealth: Payer: Self-pay

## 2015-02-13 ENCOUNTER — Encounter: Payer: Self-pay | Admitting: Gastroenterology

## 2015-02-13 VITALS — BP 127/51 | HR 62 | Temp 98.3°F | Ht 69.0 in | Wt 206.2 lb

## 2015-02-13 DIAGNOSIS — D649 Anemia, unspecified: Secondary | ICD-10-CM

## 2015-02-13 DIAGNOSIS — K760 Fatty (change of) liver, not elsewhere classified: Secondary | ICD-10-CM

## 2015-02-13 DIAGNOSIS — R1310 Dysphagia, unspecified: Secondary | ICD-10-CM

## 2015-02-13 DIAGNOSIS — R195 Other fecal abnormalities: Secondary | ICD-10-CM | POA: Insufficient documentation

## 2015-02-13 DIAGNOSIS — R1319 Other dysphagia: Secondary | ICD-10-CM | POA: Insufficient documentation

## 2015-02-13 DIAGNOSIS — R162 Hepatomegaly with splenomegaly, not elsewhere classified: Secondary | ICD-10-CM

## 2015-02-13 DIAGNOSIS — M9903 Segmental and somatic dysfunction of lumbar region: Secondary | ICD-10-CM | POA: Diagnosis not present

## 2015-02-13 DIAGNOSIS — M545 Low back pain: Secondary | ICD-10-CM | POA: Diagnosis not present

## 2015-02-13 DIAGNOSIS — R1314 Dysphagia, pharyngoesophageal phase: Secondary | ICD-10-CM | POA: Diagnosis not present

## 2015-02-13 DIAGNOSIS — M47816 Spondylosis without myelopathy or radiculopathy, lumbar region: Secondary | ICD-10-CM | POA: Diagnosis not present

## 2015-02-13 DIAGNOSIS — R140 Abdominal distension (gaseous): Secondary | ICD-10-CM

## 2015-02-13 DIAGNOSIS — M546 Pain in thoracic spine: Secondary | ICD-10-CM | POA: Diagnosis not present

## 2015-02-13 NOTE — Telephone Encounter (Signed)
Pt is aware.  

## 2015-02-13 NOTE — Progress Notes (Signed)
Quick Note:  Patient seen in office today. ______

## 2015-02-13 NOTE — Assessment & Plan Note (Signed)
Proceed with elastography to evaluate for degree of fibrosis in setting of fatty liver and hepatosplenomegaly. If high F score he will need screening for esophageal varcies.

## 2015-02-13 NOTE — Assessment & Plan Note (Signed)
Mild anemia with ifobt +. Retrieve copy of last labs, if no iron studies, then patient agrees to have done. Await elastography, but likely will need colonoscopy (+ifobt, anemia) and EGD (dysphagia, bloating, ?screen for varices if high F score on elastography). Further recommendations to follow.

## 2015-02-13 NOTE — Progress Notes (Signed)
Primary Care Physician: Rocky Morel, MD  Primary Gastroenterologist:  Garfield Cornea, MD   Chief Complaint  Patient presents with  . Follow-up    HPI: [name] is a 82 y.o. male here for follow up at request of Dr. Gala Romney for mild anemia, ifobt positive stools and hepatosplenomegaly.  Last seen in th office in October 2016.  Had failed to do ifobt and iron studies as recommended back in 09/2014. Patient has h/o fatty liver, thrombocytopenia followed by hematology. U/S in 10/2014 with fatty liver, splenomegaly. No stigmata of portal hypertension on last EGD 2013.   Dr. Gala Romney as advised for elastography to evaluate for hepatic fibrosis. Patient's last EGD/TCS 3 years ago. Has mild anemia with Hgb 12.5 and ifobt +. Some solid food dysphagia, has to wait for food to go down. "I have had blood in my stool forever on those cards". Gas/bloating starts as soon as swallows. Some heartburn, PPI most times. Not a lot of abdominal pain. Still some bloating. Gas pills help alittle. BM most days. Some urgency. No melena, brbpr.   On edoxaban for Afib.  Current Outpatient Prescriptions  Medication Sig Dispense Refill  . ALPRAZolam (XANAX) 0.5 MG tablet Take 0.5 mg by mouth at bedtime as needed. Sleep.    Marland Kitchen amLODipine (NORVASC) 5 MG tablet Take 1 tablet (5 mg total) by mouth 2 (two) times daily. 180 tablet 3  . doxazosin (CARDURA XL) 8 MG 24 hr tablet Take 1 tablet (8 mg total) by mouth at bedtime.    . edoxaban (SAVAYSA) 60 MG TABS tablet Take 60 mg by mouth daily. 28 tablet 9  . flunisolide (NASALIDE) 0.025 % SOLN Inhale 2 sprays into the lungs 2 (two) times daily.    Marland Kitchen gabapentin (NEURONTIN) 300 MG capsule Take 300 mg by mouth 3 (three) times daily.      Marland Kitchen levothyroxine (SYNTHROID, LEVOTHROID) 25 MCG tablet Take 50 mcg by mouth daily before breakfast.     . metoprolol tartrate (LOPRESSOR) 25 MG tablet TAKE ONE-HALF TABLET BY MOUTH TWICE DAILY 90 tablet 1  . pantoprazole  (PROTONIX) 40 MG tablet Take 40 mg by mouth daily as needed. Acid reflux    . Probiotic Product (ALIGN) 4 MG CAPS Take 4 mg by mouth daily. 21 capsule 0   No current facility-administered medications for this visit.    Allergies as of 02/13/2015  . (No Known Allergies)   Past Medical History  Diagnosis Date  . CAD (coronary artery disease) of artery bypass graft 2006  . Neuropathy ([place])   . GERD (gastroesophageal reflux disease)   . BPH (benign prostatic hyperplasia)   . Fatty liver   . Schatzki's ring     non critical  . Hiatal hernia   . S/P CABG (coronary artery bypass graft) 2006  . PVD (peripheral vascular disease) ([place])     0-49% R & L ICA stenosis (2013)   . History of nuclear stress test 11/2007    bruce myoview; normal pattern of persuion in all regions; post-stress EF 70%; low risk   . Gilbert's syndrome   . Thrombocytopenia ([place])   . HTN (hypertension)   . PAF (paroxysmal atrial fibrillation) ([place])   . Renal artery aneurysm [facility])    Past Surgical History  Procedure Laterality Date  . Coronary artery bypass graft  04/2004    LIMA to diagonal, SVG to ramus intermedius, SVG to PDA; placed stent for crossed coronary arteries (Dr. Prescott Gum)  .  Rotater cuff      repair  . Cataract extraction  2011  . Tonsillectomy    . Esophagogastroduodenoscopy  2006    Dr. Ruthell Rummage Schatzki's ring, s/p 58-F Maloney dilation  . Colonoscopy  2006    Dr. Margarito Courser pancolonic diverticula  . Colonoscopy  01/07/2012    Dr. Abbe Amsterdam normal rectum, pancolonic diverticulosis, hyperplastic polyp  . Esophagogastroduodenoscopy  01/07/2012    Dr. Gareth Morgan ring, hiatal hernia, fundic gland polyp   Family History  Problem Relation Age of Onset  . Heart attack Mother 36    deceased  . Heart attack Father 74    deceased  . Colon cancer Neg Hx   . Heart disease Brother     x2  . Diabetes Brother     x3   Social History   Social History  . Marital Status:  Married    Spouse Name: N/A  . Number of Children: N/A  . Years of Education: N/A   Social History Main Topics  . Smoking status: Former Smoker -- 1.00 packs/day for 40 years    Types: Cigarettes    Quit date: 04/07/2004  . Smokeless tobacco: Never Used  . Alcohol Use: Yes     Comment: occassional drinker  . Drug Use: No  . Sexual Activity: No   Other Topics Concern  . None   Social History Narrative    ROS:  General: Negative for anorexia, weight loss, fever, chills, fatigue, weakness. ENT: Negative for hoarseness, difficulty swallowing , nasal congestion. CV: Negative for chest pain, angina, palpitations, dyspnea on exertion, peripheral edema.  Respiratory: Negative for dyspnea at rest, dyspnea on exertion, cough, sputum, wheezing.  GI: See history of present illness. GU:  Negative for dysuria, hematuria, urinary incontinence, urinary frequency, nocturnal urination.  Endo: Negative for unusual weight change.    Physical Examination:   BP 127/51 mmHg  Pulse 62  Temp(Src) 98.3 F (36.8 C) (Oral)  Ht 5' 9"  (1.753 m)  Wt 206 lb 3.2 oz (93.532 kg)  BMI 30.44 kg/m2  General: Well-nourished, well-developed in no acute distress.  Eyes: No icterus. Mouth: Oropharyngeal mucosa moist and pink , no lesions erythema or exudate. Lungs: Clear to auscultation bilaterally.  Heart: Regular rate and rhythm, no murmurs rubs or gallops.  Abdomen: Bowel sounds are normal, nontender, nondistended, no hepatosplenomegaly or masses, no abdominal bruits or hernia , no rebound or guarding.   Extremities: No lower extremity edema. No clubbing or deformities. Neuro: Alert and oriented x 4   Skin: Warm and dry, no jaundice.   Psych: Alert and cooperative, normal mood and affect.  Labs:  Lab Results  Component Value Date   WBC 4.2 10/03/2014   HGB 12.1* 10/03/2014   HCT 34.5* 10/03/2014   MCV 97.7 10/03/2014   PLT 128* 10/03/2014   Lab Results  Component Value Date   CREATININE 1.19*  10/03/2014   BUN 14 10/03/2014   NA 139 10/03/2014   K 4.2 10/03/2014   CL 105 10/03/2014   CO2 25 10/03/2014   Lab Results  Component Value Date   ALT 28 10/03/2014   AST 32 10/03/2014   ALKPHOS 80 10/03/2014   BILITOT 1.0 10/03/2014   Lab Results  Component Value Date   INR 1.14 10/03/2014     Imaging Studies: No results found.

## 2015-02-13 NOTE — Telephone Encounter (Signed)
I'm aware of his blood thinner. If he has anything present in GI tract that can bleed his blood thinner could make more likely to bleed. He needs to remain on blood thinner at this time as benefits outweigh risks.  With regards to BP medication, he should address his drowsiness with prescribing provider.

## 2015-02-13 NOTE — Patient Instructions (Signed)
1. Please have ultrasound done. 2. I will review your labs from PCP and if we need to have iron done we will call you. 3. Anticipate likely colonoscopy and upper endoscopy. Await ultrasound results.

## 2015-02-13 NOTE — Telephone Encounter (Signed)
Pt in office today and states that he forgot to tell the PA about his blood thinner and his concern about that medication pertaining to his blood loss.   Pt also states that he is concerned about the BP medication making him sleepy all the time.  Please advise

## 2015-02-13 NOTE — Progress Notes (Signed)
cc'ed to pcp °

## 2015-02-15 DIAGNOSIS — M545 Low back pain: Secondary | ICD-10-CM | POA: Diagnosis not present

## 2015-02-15 DIAGNOSIS — M9903 Segmental and somatic dysfunction of lumbar region: Secondary | ICD-10-CM | POA: Diagnosis not present

## 2015-02-15 DIAGNOSIS — M546 Pain in thoracic spine: Secondary | ICD-10-CM | POA: Diagnosis not present

## 2015-02-15 DIAGNOSIS — M47816 Spondylosis without myelopathy or radiculopathy, lumbar region: Secondary | ICD-10-CM | POA: Diagnosis not present

## 2015-02-20 ENCOUNTER — Ambulatory Visit (HOSPITAL_COMMUNITY)
Admission: RE | Admit: 2015-02-20 | Discharge: 2015-02-20 | Disposition: A | Discharge status: Home / Self Care | Payer: Medicare Other | Source: Ambulatory Visit | Attending: Gastroenterology | Admitting: Gastroenterology

## 2015-02-20 DIAGNOSIS — K760 Fatty (change of) liver, not elsewhere classified: Secondary | ICD-10-CM | POA: Diagnosis not present

## 2015-02-20 DIAGNOSIS — K838 Other specified diseases of biliary tract: Secondary | ICD-10-CM | POA: Insufficient documentation

## 2015-02-20 DIAGNOSIS — R162 Hepatomegaly with splenomegaly, not elsewhere classified: Secondary | ICD-10-CM | POA: Insufficient documentation

## 2015-02-20 DIAGNOSIS — M47816 Spondylosis without myelopathy or radiculopathy, lumbar region: Secondary | ICD-10-CM | POA: Diagnosis not present

## 2015-02-20 DIAGNOSIS — M9903 Segmental and somatic dysfunction of lumbar region: Secondary | ICD-10-CM | POA: Diagnosis not present

## 2015-02-20 DIAGNOSIS — M546 Pain in thoracic spine: Secondary | ICD-10-CM | POA: Diagnosis not present

## 2015-02-20 DIAGNOSIS — M545 Low back pain: Secondary | ICD-10-CM | POA: Diagnosis not present

## 2015-02-21 NOTE — Progress Notes (Signed)
Quick Note:  ROUTING TO JULIE ______

## 2015-02-21 NOTE — Progress Notes (Signed)
Quick Note:  Please let patient know that his u/s showed high risk for fibrosis with F3/F4.  Recent labs from PCP done 01/2015. Mild anemia but no ferritin done.   1#. Please have him go get ferritin done.  2# Would offer him colonoscopy (+ifobt, anemia) and EGD (dysphagia, bloating, ?screen for varices given high F score on elastography).  PLEASE FIND OUT FROM CARDIOLOGY IF WE CAN HOLD SAVAYSA FOR PROCEDURES AND IF SO, HOLD LONG DOES HE NEED TO HOLD PRIOR TO PROCEDURES. ______

## 2015-02-21 NOTE — Progress Notes (Signed)
Reviewed labs from PCP dated 01/16/2015 Hemoglobin 12.5, hematocrit 35.2 both low, MCV 97, platelets 131,000 low, BUN 12, creatinine 1.21, total bilirubin 1.0, alkaline phosphatase 81, AST 31, ALT 22, albumin 4.5.  See u/s elastography result note.

## 2015-02-27 DIAGNOSIS — M47816 Spondylosis without myelopathy or radiculopathy, lumbar region: Secondary | ICD-10-CM | POA: Diagnosis not present

## 2015-02-27 DIAGNOSIS — M9903 Segmental and somatic dysfunction of lumbar region: Secondary | ICD-10-CM | POA: Diagnosis not present

## 2015-02-27 DIAGNOSIS — M545 Low back pain: Secondary | ICD-10-CM | POA: Diagnosis not present

## 2015-02-27 DIAGNOSIS — M546 Pain in thoracic spine: Secondary | ICD-10-CM | POA: Diagnosis not present

## 2015-03-01 ENCOUNTER — Telehealth: Payer: Self-pay

## 2015-03-01 ENCOUNTER — Other Ambulatory Visit: Payer: Self-pay

## 2015-03-01 ENCOUNTER — Other Ambulatory Visit: Payer: Self-pay | Admitting: Gastroenterology

## 2015-03-01 DIAGNOSIS — D649 Anemia, unspecified: Secondary | ICD-10-CM

## 2015-03-01 NOTE — Telephone Encounter (Signed)
Ok to hold [place]oy for 2 days prior to colonoscopy and restart >24 hours after.  Thanks.  Dr. Lemmie Evens

## 2015-03-01 NOTE — Telephone Encounter (Signed)
Dr. Gala Romney, are you okay with proceeding with TCS/EGD in endo few days past 30 day beyond OV?

## 2015-03-01 NOTE — Telephone Encounter (Signed)
Dr.Hilty, this pt was seen in our office and needs a colonoscopy and EGD with possible biopsies. We need to know if he can hold his Savaysa and if so, how many days should he hold it?   Thank you,  Burnadette Peter LPN

## 2015-03-01 NOTE — Telephone Encounter (Signed)
Thank you Dr.Hilty!! Routing to [place] to schedule procedures.

## 2015-03-01 NOTE — Telephone Encounter (Signed)
Pt 30 days is up on 03/15/2015.  RMR first available is 03/20/2015

## 2015-03-01 NOTE — Telephone Encounter (Signed)
Noted. Thanks.

## 2015-03-01 NOTE — Progress Notes (Signed)
Quick Note:  Patient called and wanted to let us know we can move forward with scheduling his procedure. ______

## 2015-03-01 NOTE — Progress Notes (Signed)
Ok to hold D[place]ray for 2 days prior to colonoscopy and restart >24 hours after EGD/TCS per Dr. Debara Pickett.

## 2015-03-02 NOTE — Telephone Encounter (Signed)
Proceed with 03/20/15 per Dr. Gala Romney.

## 2015-03-02 NOTE — Telephone Encounter (Signed)
I'm okay

## 2015-03-05 ENCOUNTER — Other Ambulatory Visit: Payer: Self-pay

## 2015-03-05 DIAGNOSIS — D649 Anemia, unspecified: Secondary | ICD-10-CM

## 2015-03-05 DIAGNOSIS — R1310 Dysphagia, unspecified: Secondary | ICD-10-CM

## 2015-03-05 DIAGNOSIS — R140 Abdominal distension (gaseous): Secondary | ICD-10-CM

## 2015-03-05 DIAGNOSIS — R195 Other fecal abnormalities: Secondary | ICD-10-CM

## 2015-03-05 MED ORDER — PEG 3350-KCL-NA BICARB-NACL 420 G PO SOLR: 4000.0000 mL | Freq: Once | ORAL | Status: DC

## 2015-03-05 NOTE — Telephone Encounter (Signed)
Mailed instructions to pt

## 2015-03-06 DIAGNOSIS — M9903 Segmental and somatic dysfunction of lumbar region: Secondary | ICD-10-CM | POA: Diagnosis not present

## 2015-03-06 DIAGNOSIS — M47816 Spondylosis without myelopathy or radiculopathy, lumbar region: Secondary | ICD-10-CM | POA: Diagnosis not present

## 2015-03-06 DIAGNOSIS — M546 Pain in thoracic spine: Secondary | ICD-10-CM | POA: Diagnosis not present

## 2015-03-06 DIAGNOSIS — M545 Low back pain: Secondary | ICD-10-CM | POA: Diagnosis not present

## 2015-03-08 DIAGNOSIS — D649 Anemia, unspecified: Secondary | ICD-10-CM | POA: Diagnosis not present

## 2015-03-09 LAB — FERRITIN: FERRITIN: 67 ng/mL (ref 22–322)

## 2015-03-20 ENCOUNTER — Encounter (HOSPITAL_COMMUNITY)
Admission: RE | Disposition: A | Discharge status: Home / Self Care | Payer: Self-pay | Source: Ambulatory Visit | Attending: Internal Medicine

## 2015-03-20 ENCOUNTER — Ambulatory Visit (HOSPITAL_COMMUNITY)
Admission: RE | Admit: 2015-03-20 | Discharge: 2015-03-20 | Disposition: A | Discharge status: Home / Self Care | Payer: Medicare Other | Source: Ambulatory Visit | Attending: Internal Medicine | Admitting: Internal Medicine

## 2015-03-20 ENCOUNTER — Encounter (HOSPITAL_COMMUNITY): Payer: Self-pay | Admitting: *Deleted

## 2015-03-20 DIAGNOSIS — Z951 Presence of aortocoronary bypass graft: Secondary | ICD-10-CM | POA: Insufficient documentation

## 2015-03-20 DIAGNOSIS — I480 Paroxysmal atrial fibrillation: Secondary | ICD-10-CM | POA: Diagnosis not present

## 2015-03-20 DIAGNOSIS — K5730 Diverticulosis of large intestine without perforation or abscess without bleeding: Secondary | ICD-10-CM | POA: Diagnosis not present

## 2015-03-20 DIAGNOSIS — K317 Polyp of stomach and duodenum: Secondary | ICD-10-CM | POA: Insufficient documentation

## 2015-03-20 DIAGNOSIS — R140 Abdominal distension (gaseous): Secondary | ICD-10-CM

## 2015-03-20 DIAGNOSIS — I25810 Atherosclerosis of coronary artery bypass graft(s) without angina pectoris: Secondary | ICD-10-CM | POA: Insufficient documentation

## 2015-03-20 DIAGNOSIS — Z7901 Long term (current) use of anticoagulants: Secondary | ICD-10-CM | POA: Insufficient documentation

## 2015-03-20 DIAGNOSIS — R1310 Dysphagia, unspecified: Secondary | ICD-10-CM

## 2015-03-20 DIAGNOSIS — Z79899 Other long term (current) drug therapy: Secondary | ICD-10-CM | POA: Diagnosis not present

## 2015-03-20 DIAGNOSIS — K449 Diaphragmatic hernia without obstruction or gangrene: Secondary | ICD-10-CM | POA: Insufficient documentation

## 2015-03-20 DIAGNOSIS — K219 Gastro-esophageal reflux disease without esophagitis: Secondary | ICD-10-CM | POA: Insufficient documentation

## 2015-03-20 DIAGNOSIS — R195 Other fecal abnormalities: Secondary | ICD-10-CM | POA: Diagnosis not present

## 2015-03-20 DIAGNOSIS — I8510 Secondary esophageal varices without bleeding: Secondary | ICD-10-CM | POA: Diagnosis not present

## 2015-03-20 DIAGNOSIS — K760 Fatty (change of) liver, not elsewhere classified: Secondary | ICD-10-CM | POA: Insufficient documentation

## 2015-03-20 DIAGNOSIS — K769 Liver disease, unspecified: Secondary | ICD-10-CM

## 2015-03-20 DIAGNOSIS — I10 Essential (primary) hypertension: Secondary | ICD-10-CM | POA: Insufficient documentation

## 2015-03-20 DIAGNOSIS — D649 Anemia, unspecified: Secondary | ICD-10-CM

## 2015-03-20 HISTORY — PX: ESOPHAGOGASTRODUODENOSCOPY: SHX5428

## 2015-03-20 HISTORY — PX: COLONOSCOPY: SHX5424

## 2015-03-20 SURGERY — COLONOSCOPY
Anesthesia: Moderate Sedation

## 2015-03-20 MED ORDER — LIDOCAINE VISCOUS 2 % MT SOLN
OROMUCOSAL | Status: DC | PRN
  Administered 2015-03-20: 3 mL via OROMUCOSAL

## 2015-03-20 MED ORDER — MEPERIDINE HCL 100 MG/ML IJ SOLN
INTRAMUSCULAR | Status: DC | PRN
  Administered 2015-03-20: 50 mg via INTRAVENOUS
  Administered 2015-03-20: 25 mg via INTRAVENOUS

## 2015-03-20 MED ORDER — STERILE WATER FOR IRRIGATION IR SOLN
Status: DC | PRN
  Administered 2015-03-20: 11:00:00

## 2015-03-20 MED ORDER — SODIUM CHLORIDE 0.9 % IV SOLN
INTRAVENOUS | Status: DC
  Administered 2015-03-20: 1000 mL via INTRAVENOUS

## 2015-03-20 MED ORDER — MEPERIDINE HCL 100 MG/ML IJ SOLN
INTRAMUSCULAR | Status: AC
  Filled 2015-03-20: qty 2

## 2015-03-20 MED ORDER — MIDAZOLAM HCL 5 MG/5ML IJ SOLN
INTRAMUSCULAR | Status: AC
  Filled 2015-03-20: qty 10

## 2015-03-20 MED ORDER — MIDAZOLAM HCL 5 MG/5ML IJ SOLN
INTRAMUSCULAR | Status: DC | PRN
Start: 2015-03-20 — End: 2015-03-20
  Administered 2015-03-20: 2 mg via INTRAVENOUS
  Administered 2015-03-20: 1 mg via INTRAVENOUS
  Administered 2015-03-20: 2 mg via INTRAVENOUS
  Administered 2015-03-20 (×3): 1 mg via INTRAVENOUS

## 2015-03-20 MED ORDER — ONDANSETRON HCL 4 MG/2ML IJ SOLN
INTRAMUSCULAR | Status: AC
  Filled 2015-03-20: qty 2

## 2015-03-20 MED ORDER — ONDANSETRON HCL 4 MG/2ML IJ SOLN
INTRAMUSCULAR | Status: DC | PRN
  Administered 2015-03-20: 4 mg via INTRAVENOUS

## 2015-03-20 MED ORDER — LIDOCAINE VISCOUS 2 % MT SOLN
OROMUCOSAL | Status: AC
  Filled 2015-03-20: qty 15

## 2015-03-20 NOTE — Op Note (Signed)
[facility] [address] [place], [address]   COLONOSCOPY PROCEDURE REPORT  PATIENT: Shane, [name]  MR#: [id number] BIRTHDATE: 11-12-1933 , 70  yrs. old GENDER: male ENDOSCOPIST: R.  Garfield Cornea, MD FACP [facility] REFERRED ML:JQGBEE Ethlyn Gallery, M.D. PROCEDURE DATE:  04/10/2015 PROCEDURE:   Ileo-colonoscopy, diagnostic INDICATIONS:Hemoccult-positive stool. MEDICATIONS: Versed 8 mg IV and Demerol 75 mg IV in divided doses. Zofran 4 mg IV. ASA CLASS:       Class III  CONSENT: The risks, benefits, alternatives and imponderables including but not limited to bleeding, perforation as well as the possibility of a missed lesion have been reviewed.  The potential for biopsy, lesion removal, etc. have also been discussed. Questions have been answered.  All parties agreeable.  Please see the history and physical in the medical record for more information.  DESCRIPTION OF PROCEDURE:   After the risks benefits and alternatives of the procedure were thoroughly explained, informed consent was obtained.  The digital rectal exam revealed no abnormalities of the rectum.   The [id number] ([id number])  endoscope was introduced through the anus and advanced to the terminal ileum which was intubated for a short distance. No adverse events experienced.   The quality of the prep was adequate  The instrument was then slowly withdrawn as the colon was fully examined. Estimated blood loss is zero unless otherwise noted in this procedure report.    Normal-appearing rectal mucosa. Pancolonic diverticula; the remainder of the colonic mucosa appeared normal.  The distal 5 cm of terminal ileum mucosa also appeared normal.       Retroflexion was performed. .  Withdrawal time=9 minutes 0 seconds.  The scope was withdrawn and the procedure completed. COMPLICATIONS: There were no immediate complications.  ENDOSCOPIC IMPRESSION: Pancolonic diverticulosis.  RECOMMENDATIONS: Office visit with  [place] in 4-6 weeks to continue with cirrhosis care. I do not recommend a future colonoscopy unless new symptoms develop. See EGD report. Resume anticoagulation therapy today.  eSigned:  R. Garfield Cornea, MD Rosalita Chessman [facility] 10-Apr-2015 11:26 AM   cc:  CPT CODES: ICD CODES:  The ICD and CPT codes recommended by this software are interpretations from the data that the clinical staff has captured with the software.  The verification of the translation of this report to the ICD and CPT codes and modifiers is the sole responsibility of the health care institution and practicing physician where this report was generated.  [employer] will not be held responsible for the validity of the ICD and CPT codes included on this report.  AMA assumes no liability for data contained or not contained herein. CPT is a Designer, television/film set of the [employer].  PATIENT NAME:  Shane, [name] MR#: [id number]

## 2015-03-20 NOTE — Discharge Instructions (Signed)
EGD Discharge instructions Please read the instructions outlined below and refer to this sheet in the next few weeks. These discharge instructions provide you with general information on caring for yourself after you leave the hospital. Your doctor may also give you specific instructions. While your treatment has been planned according to the most current medical practices available, unavoidable complications occasionally occur. If you have any problems or questions after discharge, please call your doctor. ACTIVITY  You may resume your regular activity but move at a slower pace for the next 24 hours.   Take frequent rest periods for the next 24 hours.   Walking will help expel (get rid of) the air and reduce the bloated feeling in your abdomen.   No driving for 24 hours (because of the anesthesia (medicine) used during the test).   You may shower.   Do not sign any important legal documents or operate any machinery for 24 hours (because of the anesthesia used during the test).  NUTRITION  Drink plenty of fluids.   You may resume your normal diet.   Begin with a light meal and progress to your normal diet.   Avoid alcoholic beverages for 24 hours or as instructed by your caregiver.  MEDICATIONS  You may resume your normal medications unless your caregiver tells you otherwise.  WHAT YOU CAN EXPECT TODAY  You may experience abdominal discomfort such as a feeling of fullness or gas pains.  FOLLOW-UP  Your doctor will discuss the results of your test with you.  SEEK IMMEDIATE MEDICAL ATTENTION IF ANY OF THE FOLLOWING OCCUR:  Excessive nausea (feeling sick to your stomach) and/or vomiting.   Severe abdominal pain and distention (swelling).   Trouble swallowing.   Temperature over 101 F (37.8 C).   Rectal bleeding or vomiting of blood.    Colonoscopy Discharge Instructions  Read the instructions outlined below and refer to this sheet in the next few weeks. These  discharge instructions provide you with general information on caring for yourself after you leave the hospital. Your doctor may also give you specific instructions. While your treatment has been planned according to the most current medical practices available, unavoidable complications occasionally occur. If you have any problems or questions after discharge, call Dr. Gala Romney at [phone]. ACTIVITY  You may resume your regular activity, but move at a slower pace for the next 24 hours.   Take frequent rest periods for the next 24 hours.   Walking will help get rid of the air and reduce the bloated feeling in your belly (abdomen).   No driving for 24 hours (because of the medicine (anesthesia) used during the test).    Do not sign any important legal documents or operate any machinery for 24 hours (because of the anesthesia used during the test).  NUTRITION  Drink plenty of fluids.   You may resume your normal diet as instructed by your doctor.   Begin with a light meal and progress to your normal diet. Heavy or fried foods are harder to digest and may make you feel sick to your stomach (nauseated).   Avoid alcoholic beverages for 24 hours or as instructed.  MEDICATIONS  You may resume your normal medications unless your doctor tells you otherwise.  WHAT YOU CAN EXPECT TODAY  Some feelings of bloating in the abdomen.   Passage of more gas than usual.   Spotting of blood in your stool or on the toilet paper.  IF YOU HAD POLYPS REMOVED DURING THE  COLONOSCOPY:  No aspirin products for 7 days or as instructed.   No alcohol for 7 days or as instructed.   Eat a soft diet for the next 24 hours.  FINDING OUT THE RESULTS OF YOUR TEST Not all test results are available during your visit. If your test results are not back during the visit, make an appointment with your caregiver to find out the results. Do not assume everything is normal if you have not heard from your caregiver or the  medical facility. It is important for you to follow up on all of your test results.  SEEK IMMEDIATE MEDICAL ATTENTION IF:  You have more than a spotting of blood in your stool.   Your belly is swollen (abdominal distention).   You are nauseated or vomiting.   You have a temperature over 101.   You have abdominal pain or discomfort that is severe or gets worse throughout the day.    Diverticulosis information provided  I do not recommend a future colonoscopy unless she developed these symptoms  Further recommendations to follow pending review of pathology report  Office visit with extender in 4 weeks to carry on with cirrhosis care  Resume Savaysa today   Diverticulosis Diverticulosis is the condition that develops when small pouches (diverticula) form in the wall of your colon. Your colon, or large intestine, is where water is absorbed and stool is formed. The pouches form when the inside layer of your colon pushes through weak spots in the outer layers of your colon. CAUSES  No one knows exactly what causes diverticulosis. RISK FACTORS  Being older than 13. Your risk for this condition increases with age. Diverticulosis is rare in people younger than 40 years. By age 30, almost everyone has it.  Eating a low-fiber diet.  Being frequently constipated.  Being overweight.  Not getting enough exercise.  Smoking.  Taking over-the-counter pain medicines, like aspirin and ibuprofen. SYMPTOMS  Most people with diverticulosis do not have symptoms. DIAGNOSIS  Because diverticulosis often has no symptoms, health care providers often discover the condition during an exam for other colon problems. In many cases, a health care provider will diagnose diverticulosis while using a flexible scope to examine the colon (colonoscopy). TREATMENT  If you have never developed an infection related to diverticulosis, you may not need treatment. If you have had an infection before, treatment  may include:  Eating more fruits, vegetables, and grains.  Taking a fiber supplement.  Taking a live bacteria supplement (probiotic).  Taking medicine to relax your colon. HOME CARE INSTRUCTIONS   Drink at least 6-8 glasses of water each day to prevent constipation.  Try not to strain when you have a bowel movement.  Keep all follow-up appointments. If you have had an infection before:  Increase the fiber in your diet as directed by your health care provider or dietitian.  Take a dietary fiber supplement if your health care provider approves.  Only take medicines as directed by your health care provider. SEEK MEDICAL CARE IF:   You have abdominal pain.  You have bloating.  You have cramps.  You have not gone to the bathroom in 3 days. SEEK IMMEDIATE MEDICAL CARE IF:   Your pain gets worse.  Yourbloating becomes very bad.  You have a fever or chills, and your symptoms suddenly get worse.  You begin vomiting.  You have bowel movements that are bloody or black. MAKE SURE YOU:  Understand these instructions.  Will watch your condition.  Will get help right away if you are not doing well or get worse.   This information is not intended to replace advice given to you by your health care provider. Make sure you discuss any questions you have with your health care provider.   Document Released: 10/18/2003 Document Revised: 01/25/2013 Document Reviewed: 12/15/2012 Elsevier Interactive Patient Education [employer].

## 2015-03-20 NOTE — H&P (Signed)
@LOGO @   Primary Care Physician:  Rocky Morel, MD Primary Gastroenterologist:  Dr. Gala Romney  Pre-Procedure History & Physical: HPI:  [name] is a 82 y.o. male here for consideration of further evaluation of occult liver disease with F3 F4 Medivire score. Hemoccult-positive stool. Last colonoscopy EGD of about 4 years ago. No gross bleeding (melena, hematemesis or hematochezia). Has hepatomegaly. Was seen in the office January 10. Not scheduled until today. Incidentally, patient now denies any solid food dysphagia, whatsoever.  Past Medical History  Diagnosis Date  . CAD (coronary artery disease) of artery bypass graft 2006  . Neuropathy ([place])   . GERD (gastroesophageal reflux disease)   . BPH (benign prostatic hyperplasia)   . Fatty liver   . Schatzki's ring     non critical  . Hiatal hernia   . S/P CABG (coronary artery bypass graft) 2006  . PVD (peripheral vascular disease) ([place])     0-49% R & L ICA stenosis (2013)   . History of nuclear stress test 11/2007    bruce myoview; normal pattern of persuion in all regions; post-stress EF 70%; low risk   . Gilbert's syndrome   . Thrombocytopenia ([employer])   . HTN (hypertension)   . PAF (paroxysmal atrial fibrillation) ([place])   . Renal artery aneurysm [facility])     Past Surgical History  Procedure Laterality Date  . Coronary artery bypass graft  04/2004    LIMA to diagonal, SVG to ramus intermedius, SVG to PDA; placed stent for crossed coronary arteries (Dr. Prescott Gum)  . Rotater cuff      repair  . Cataract extraction  2011  . Tonsillectomy    . Esophagogastroduodenoscopy  2006    Dr. Ruthell Rummage Schatzki's ring, s/p 58-F Maloney dilation  . Colonoscopy  2006    Dr. Margarito Courser pancolonic diverticula  . Colonoscopy  01/07/2012    Dr. Abbe Amsterdam normal rectum, pancolonic diverticulosis, hyperplastic polyp  . Esophagogastroduodenoscopy  01/07/2012    Dr. Gareth Morgan ring, hiatal hernia, fundic gland  polyp    Prior to Admission medications   Medication Sig Start Date End Date Taking? Authorizing Provider  ALPRAZolam Duanne Moron) 0.5 MG tablet Take 0.5 mg by mouth at bedtime as needed. Sleep.   Yes Historical Provider, MD  amLODipine (NORVASC) 5 MG tablet Take 1 tablet (5 mg total) by mouth 2 (two) times daily. 10/07/13  Yes Pixie Casino, MD  doxazosin (CARDURA XL) 8 MG 24 hr tablet Take 1 tablet (8 mg total) by mouth at bedtime. 04/17/13  Yes Kinnie Feil, MD  edoxaban (SAVAYSA) 60 MG TABS tablet Take 60 mg by mouth daily. 01/25/15  Yes Pixie Casino, MD  flunisolide (NASALIDE) 0.025 % SOLN Inhale 2 sprays into the lungs daily.    Yes Historical Provider, MD  gabapentin (NEURONTIN) 300 MG capsule Take 300 mg by mouth 3 (three) times daily.     Yes Historical Provider, MD  levothyroxine (SYNTHROID, LEVOTHROID) 50 MCG tablet Take 50 mcg by mouth daily before breakfast.   Yes Historical Provider, MD  metoprolol tartrate (LOPRESSOR) 25 MG tablet TAKE ONE-HALF TABLET BY MOUTH TWICE DAILY 11/20/14  Yes Pixie Casino, MD  pantoprazole (PROTONIX) 40 MG tablet Take 40 mg by mouth daily as needed. Acid reflux   Yes Historical Provider, MD  polyethylene glycol-electrolytes (NULYTELY/GOLYTELY) 420 g solution Take 4,000 mLs by mouth once. 03/05/15  Yes Mahala Menghini, PA-C  Probiotic Product (ALIGN) 4 MG CAPS Take 4 mg by mouth daily. 10/03/14  Mahala Menghini, PA-C    Allergies as of 03/05/2015  . (No Known Allergies)    Family History  Problem Relation Age of Onset  . Heart attack Mother 61    deceased  . Heart attack Father 61    deceased  . Colon cancer Neg Hx   . Heart disease Brother     x2  . Diabetes Brother     x3    Social History   Social History  . Marital Status: Married    Spouse Name: N/A  . Number of Children: N/A  . Years of Education: N/A   Occupational History  . Not on file.   Social History Main Topics  . Smoking status: Former Smoker -- 1.00 packs/day for  40 years    Types: Cigarettes    Quit date: 04/07/2004  . Smokeless tobacco: Never Used  . Alcohol Use: Yes     Comment: occassional drinker  . Drug Use: No  . Sexual Activity: No   Other Topics Concern  . Not on file   Social History Narrative    Review of Systems: See HPI, otherwise negative ROS  Physical Exam: BP 156/72 mmHg  Pulse 57  Temp(Src) 98.6 F (37 C) (Oral)  Resp 15  Ht 5' 8.5" (1.74 m)  Wt 206 lb (93.441 kg)  BMI 30.86 kg/m2  SpO2 98% General:   Alert,  Well-developed, well-nourished, pleasant and cooperative in NAD Skin:  Intact without significant lesions or rashes. Eyes:  Sclera clear, no icterus.   Conjunctiva pink. Ears:  Normal auditory acuity. Nose:  No deformity, discharge,  or lesions. Mouth:  No deformity or lesions. Neck:  Supple; no masses or thyromegaly. No significant cervical adenopathy. Lungs:  Clear throughout to auscultation.   No wheezes, crackles, or rhonchi. No acute distress. Heart:  Regular rate and rhythm; no murmurs, clicks, rubs,  or gallops. Abdomen: Non-distended, normal bowel sounds.  Soft and nontender without appreciable mass or hepatosplenomegaly.  Pulses:  Normal pulses noted. Extremities:  Without clubbing or edema.  Impression:  82 year old gentleman with F3 F4 Medivir score,  hepatomegaly. Hemoccult-positive stool. Agreement with EGD and colonoscopy today former for variceal screening. Anticoagulation until 2 days ago per cardiologist's recommendations.   Recommendations:  Screening/ diagnostic EGD and diagnostic colonoscopy for Hemoccult-positive stool.The risks, benefits, limitations, imponderables and alternatives regarding both EGD and colonoscopy have been reviewed with the patient. Questions have been answered. All parties agreeable.      Notice: This dictation was prepared with Dragon dictation along with smaller phrase technology. Any transcriptional errors that result from this process are unintentional and  may not be corrected upon review.

## 2015-03-20 NOTE — Op Note (Signed)
[facility] [address] [place], [address]   ENDOSCOPY PROCEDURE REPORT  PATIENT: [name], [name]  MR#: [id number] BIRTHDATE: [birth year] , 33  yrs. old GENDER: male ENDOSCOPIST: R.  Garfield Cornea, MD FACP FACG REFERRED BY:  Micheline Rough, M.D. PROCEDURE DATE:  April 16, 2015 PROCEDURE:  EGD w/ biopsy INDICATIONS:  Esophageal varices screening; patient denies dysphagia. MEDICATIONS: Versed 4 mg IV and Demerol 75 mg IV in divided doses. Zofran 4 mg IV.  Xylocaine gel orally. ASA CLASS:      Class III  CONSENT: The risks, benefits, limitations, alternatives and imponderables have been discussed.  The potential for biopsy, esophogeal dilation, etc. have also been reviewed.  Questions have been answered.  All parties agreeable.  Please see the history and physical in the medical record for more information.  DESCRIPTION OF PROCEDURE: After the risks benefits and alternatives of the procedure were thoroughly explained, informed consent was obtained.  The [id number] ([id number]) endoscope was introduced through the mouth and advanced to the second portion of the duodenum , limited by Without limitations. The instrument was slowly withdrawn as the mucosa was fully examined. Estimated blood loss is zero unless otherwise noted in this procedure report.    3 columns grade 1 esophageal varices; otherwise, the tubular esophagus appeared normal.  Stomach empty.  Multiple hyperplastic appearing polyps.  Small hiatal hernia.  No portal hypertensive gastropathy seen or varices.  Patent pylorus.  Normal first and second portion of the duodenum  One of the gastric polyp was biopsied.  Retroflexed views revealed a hiatal hernia.     The scope was then withdrawn from the patient and the procedure completed.  COMPLICATIONS: There were no immediate complications.  ENDOSCOPIC IMPRESSION: Grade 1 esophageal varices. Hiatal hernia. Gastric polyps?"status post  biopsy  RECOMMENDATIONS: Patient will need to return to the office for cirrhosis care. Anticipate starting a nonspecific beta blocker in the near future. See colonoscopy report.  REPEAT EXAM:  eSigned:  R. Garfield Cornea, MD Rosalita Chessman [facility] 16-Apr-2015 10:59 AM    CC:  CPT CODES: ICD CODES:  The ICD and CPT codes recommended by this software are interpretations from the data that the clinical staff has captured with the software.  The verification of the translation of this report to the ICD and CPT codes and modifiers is the sole responsibility of the health care institution and practicing physician where this report was generated.  [employer]. will not be held responsible for the validity of the ICD and CPT codes included on this report.  AMA assumes no liability for data contained or not contained herein. CPT is a Designer, television/film set of the [employer].  PATIENT NAME:  [name], [name] MR#: [id number]

## 2015-03-21 NOTE — Progress Notes (Signed)
Quick Note:  Ferritin normal. ______

## 2015-03-22 ENCOUNTER — Encounter (HOSPITAL_COMMUNITY): Payer: Self-pay | Admitting: Internal Medicine

## 2015-03-22 ENCOUNTER — Other Ambulatory Visit: Payer: Self-pay | Admitting: Internal Medicine

## 2015-03-22 NOTE — Telephone Encounter (Signed)
Rx request sent to pharmacy.  

## 2015-03-26 ENCOUNTER — Telehealth: Payer: Self-pay

## 2015-03-26 ENCOUNTER — Encounter: Payer: Self-pay | Admitting: Internal Medicine

## 2015-03-26 NOTE — Telephone Encounter (Signed)
Letter mailed to the pt. 

## 2015-03-26 NOTE — Telephone Encounter (Signed)
OV made °

## 2015-03-26 NOTE — Telephone Encounter (Signed)
Per RMR-   Send letter to patient.  Send copy of letter with path to referring provider and PCP.   Needs ov w extender to contemplate beginning a b blocker, cont w cirrhosis care in the next several weeks

## 2015-04-10 DIAGNOSIS — L570 Actinic keratosis: Secondary | ICD-10-CM | POA: Diagnosis not present

## 2015-04-10 DIAGNOSIS — D485 Neoplasm of uncertain behavior of skin: Secondary | ICD-10-CM | POA: Diagnosis not present

## 2015-04-10 DIAGNOSIS — L409 Psoriasis, unspecified: Secondary | ICD-10-CM | POA: Diagnosis not present

## 2015-04-10 DIAGNOSIS — D0461 Carcinoma in situ of skin of right upper limb, including shoulder: Secondary | ICD-10-CM | POA: Diagnosis not present

## 2015-04-17 ENCOUNTER — Encounter: Payer: Self-pay | Admitting: Gastroenterology

## 2015-04-17 ENCOUNTER — Ambulatory Visit: Payer: Medicare Other | Admitting: Gastroenterology

## 2015-04-17 ENCOUNTER — Ambulatory Visit (INDEPENDENT_AMBULATORY_CARE_PROVIDER_SITE_OTHER): Payer: Medicare Other | Admitting: Gastroenterology

## 2015-04-17 ENCOUNTER — Telehealth: Payer: Self-pay

## 2015-04-17 VITALS — BP 145/64 | HR 66 | Temp 97.0°F | Ht 69.0 in | Wt 204.0 lb

## 2015-04-17 DIAGNOSIS — K769 Liver disease, unspecified: Secondary | ICD-10-CM

## 2015-04-17 DIAGNOSIS — I8500 Esophageal varices without bleeding: Secondary | ICD-10-CM | POA: Diagnosis not present

## 2015-04-17 DIAGNOSIS — R162 Hepatomegaly with splenomegaly, not elsewhere classified: Secondary | ICD-10-CM | POA: Diagnosis not present

## 2015-04-17 MED ORDER — NAPROXEN 500 MG PO TABS: 500.0000 mg | ORAL_TABLET | Freq: Every day | ORAL | Status: DC

## 2015-04-17 NOTE — Progress Notes (Signed)
Patient needs abd u/s in 08/2015 for [facility] screening. OV with [place] in six months.

## 2015-04-17 NOTE — Patient Instructions (Signed)
1. Please use naproxyn sparingly given that you are on Savaysa (blood thinner). I would recommend not using on a daily basis. 2. Please have your labs done.

## 2015-04-17 NOTE — Assessment & Plan Note (Signed)
82 year old gentleman with chronic fatty liver/spleen megaly with thrombocytopenia for years. No evidence of portal hypertension in 2013 at time of EGD. Recent elastography with metavir F3/F4. Recent EGD with grade 1 esophageal varices, 3 columns. Patient comes back for cirrhosis care. We will update his labs/meld score. Appears to be well compensated. Will check immune status to hepatitis A and B. Discussed need for ultrasound of the abdomen every 6 months for [facility] screening. Consider nonselective beta blocker given esophageal varices although patient is apprehensive regarding change in his medication. Current pulse rate in the low 60s. We will discuss further with Dr. Gala Romney.  Patient requested several non-gi issues to be addressed today. I deferred to his PCP regarding his handicap tag, gout management. He requested naprosyn rx to get from [place]. Uses less than 2 per month. Discussed need to use very sparingly given use of Savaysa.   Return to the office in six months.

## 2015-04-17 NOTE — Progress Notes (Signed)
cc'ed to pcp °

## 2015-04-17 NOTE — Telephone Encounter (Signed)
Per Neil Crouch, PA , I called [facility] in [place] and cancelled the prescription for the Naproxen. ( I spoke to [place]).  She gave pt a written prescription for him to take to check prices.

## 2015-04-17 NOTE — Progress Notes (Signed)
Primary Care Physician: Glo Herring., MD  Primary Gastroenterologist:  Garfield Cornea, MD   Chief Complaint  Patient presents with  . Follow-up    HPI: [name] is a 82 y.o. male here for follow up. Was seen back in January 2017 with mild anemia, I FOBT positive stools, hepatosplenomegaly. In the past he has been followed by hematology for thrombocytopenia. Noted to have fatty liver with splenomegaly for years. No portal hypertension on EGD in 2013.  He had u/s with elastography January 2017 showed hepatic steatosis, Metavir F3/F4. He underwent a colonoscopy for anemia, heme positive stool. Had pancolonic diverticulosis. He underwent an EGD for dysphagia, bloating, screen for varices. He had grade 1 esophageal varices, 3 columns. Hiatal hernia, fundic gland polyp. No H pylori.  Overall doing well. Complains of ongoing joint pain. Upset Dr. Ethlyn Gallery is no longer at [facility]. Says he used to take Naprosyn as needed for joint pain, 90 tablets may last 2 years. He would like to have another prescription. We discussed possibility of switching to a non-selective beta blocker given esophageal varices. Patient reports that he is on metoprolol at this time his heart rate stays around 60 if he takes 2 a day. Sometimes he may take 1-1/2 only and that his heart rate get to the upper 60s because he feels better with that heart rate. Denies melena, rectal bleeding. Bowel movements are regular. No abdominal pain. Appetite is good.     Current Outpatient Prescriptions  Medication Sig Dispense Refill  . ALPRAZolam (XANAX) 0.5 MG tablet Take 0.5 mg by mouth at bedtime as needed. Sleep.    Marland Kitchen amLODipine (NORVASC) 5 MG tablet Take 1 tablet (5 mg total) by mouth 2 (two) times daily. 180 tablet 3  . doxazosin (CARDURA XL) 8 MG 24 hr tablet Take 1 tablet (8 mg total) by mouth at bedtime.    . edoxaban (SAVAYSA) 60 MG TABS tablet Take 60 mg by mouth daily. 28 tablet 9  . flunisolide (NASALIDE)  0.025 % SOLN Inhale 2 sprays into the lungs daily.     Marland Kitchen gabapentin (NEURONTIN) 300 MG capsule Take 300 mg by mouth 3 (three) times daily.      Marland Kitchen levothyroxine (SYNTHROID, LEVOTHROID) 50 MCG tablet Take 50 mcg by mouth daily before breakfast.    . metoprolol tartrate (LOPRESSOR) 25 MG tablet TAKE ONE-HALF TABLET BY MOUTH TWICE DAILY 90 tablet 0  . pantoprazole (PROTONIX) 40 MG tablet Take 40 mg by mouth daily as needed. Acid reflux    . Probiotic Product (ALIGN) 4 MG CAPS Take 4 mg by mouth daily. 21 capsule 0   No current facility-administered medications for this visit.    Allergies as of 04/17/2015 - Review Complete 04/17/2015  Allergen Reaction Noted  . Antihistamines, chlorpheniramine-type Rash 08/26/2011    ROS:  General: Negative for anorexia, weight loss, fever, chills, fatigue, weakness. ENT: Negative for hoarseness, difficulty swallowing , nasal congestion. CV: Negative for chest pain, angina, palpitations, dyspnea on exertion, peripheral edema.  Respiratory: Negative for dyspnea at rest, dyspnea on exertion, cough, sputum, wheezing.  GI: See history of present illness. GU:  Negative for dysuria, hematuria, urinary incontinence, urinary frequency, nocturnal urination.  Endo: Negative for unusual weight change.    Physical Examination:   BP 145/64 mmHg  Pulse 66  Temp(Src) 97 F (36.1 C)  Ht 5' 9"  (1.753 m)  Wt 204 lb (92.534 kg)  BMI 30.11 kg/m2  General: Well-nourished, well-developed in no acute distress.  Eyes: No icterus. Mouth: Oropharyngeal mucosa moist and pink , no lesions erythema or exudate. Lungs: Clear to auscultation bilaterally.  Heart: Regular rate and rhythm, no murmurs rubs or gallops.  Abdomen: Bowel sounds are normal, nontender, nondistended, liver edge easily palpable.  no abdominal bruits or hernia , no rebound or guarding.   Extremities: No lower extremity edema. No clubbing or deformities. Neuro: Alert and oriented x 4   Skin: Warm and dry,  no jaundice.   Psych: Alert and cooperative, normal mood and affect.

## 2015-04-18 ENCOUNTER — Encounter: Payer: Self-pay | Admitting: Internal Medicine

## 2015-04-18 LAB — PROTIME-INR
INR: 1 (ref 0.8–1.2)
Prothrombin Time: 10.8 s (ref 9.1–12.0)

## 2015-04-18 LAB — COMPREHENSIVE METABOLIC PANEL
A/G RATIO: 1.6 (ref 1.2–2.2)
ALBUMIN: 4.5 g/dL (ref 3.5–4.8)
ALT: 20 IU/L (ref 0–44)
AST: 31 IU/L (ref 0–40)
Alkaline Phosphatase: 96 IU/L (ref 39–117)
BUN / CREAT RATIO: 12 (ref 10–22)
BUN: 15 mg/dL (ref 8–27)
Bilirubin Total: 0.9 mg/dL (ref 0.0–1.2)
CALCIUM: 8.9 mg/dL (ref 8.6–10.2)
CO2: 19 mmol/L (ref 18–29)
Chloride: 101 mmol/L (ref 96–106)
Creatinine, Ser: 1.22 mg/dL (ref 0.76–1.27)
GFR, EST AFRICAN AMERICAN: 65 mL/min/{1.73_m2} (ref >59)
GFR, EST NON AFRICAN AMERICAN: 56 mL/min/{1.73_m2} — AB (ref >59)
Globulin, Total: 2.8 g/dL (ref 1.5–4.5)
Glucose: 97 mg/dL (ref 65–99)
POTASSIUM: 4 mmol/L (ref 3.5–5.2)
Sodium: 142 mmol/L (ref 134–144)
TOTAL PROTEIN: 7.3 g/dL (ref 6.0–8.5)

## 2015-04-18 LAB — CBC WITH DIFFERENTIAL/PLATELET
BASOS: 0 %
Basophils Absolute: 0 10*3/uL (ref 0.0–0.2)
EOS (ABSOLUTE): 0.1 10*3/uL (ref 0.0–0.4)
EOS: 3 %
HEMOGLOBIN: 12.3 g/dL — AB (ref 12.6–17.7)
Hematocrit: 35.7 % — ABNORMAL LOW (ref 37.5–51.0)
IMMATURE GRANS (ABS): 0 10*3/uL (ref 0.0–0.1)
IMMATURE GRANULOCYTES: 0 %
LYMPHS: 29 %
Lymphocytes Absolute: 1.3 10*3/uL (ref 0.7–3.1)
MCH: 33.3 pg — ABNORMAL HIGH (ref 26.6–33.0)
MCHC: 34.5 g/dL (ref 31.5–35.7)
MCV: 97 fL (ref 79–97)
MONOCYTES: 10 %
Monocytes Absolute: 0.5 10*3/uL (ref 0.1–0.9)
NEUTROS ABS: 2.7 10*3/uL (ref 1.4–7.0)
NEUTROS PCT: 58 %
PLATELETS: 175 10*3/uL (ref 150–379)
RBC: 3.69 x10E6/uL — ABNORMAL LOW (ref 4.14–5.80)
RDW: 13.1 % (ref 12.3–15.4)
WBC: 4.6 10*3/uL (ref 3.4–10.8)

## 2015-04-18 LAB — HEPATITIS C ANTIBODY

## 2015-04-18 LAB — HEPATITIS A ANTIBODY, TOTAL: Hep A Total Ab: NEGATIVE

## 2015-04-18 LAB — HEPATITIS B SURFACE ANTIBODY,QUALITATIVE: HEP B SURFACE AB, QUAL: NONREACTIVE

## 2015-04-18 LAB — HEPATITIS B SURFACE ANTIGEN: HEP B S AG: NEGATIVE

## 2015-04-18 NOTE — Progress Notes (Signed)
APPT MADE AND LETTER SENT ON RECALL

## 2015-04-23 ENCOUNTER — Other Ambulatory Visit: Payer: Self-pay | Admitting: Gastroenterology

## 2015-04-23 DIAGNOSIS — K7460 Unspecified cirrhosis of liver: Secondary | ICD-10-CM

## 2015-04-23 NOTE — Progress Notes (Signed)
Quick Note:  Hgb slightly low but stable. Ferritin ok before. MELD 8, indicating good hepatic function. Recommend Hep A and B vaccines, he can have done with PCP or local pharmacy. Dx: cirrhosis. Repeat CBC, CMET in 3 months. ______

## 2015-05-03 DIAGNOSIS — C4361 Malignant melanoma of right upper limb, including shoulder: Secondary | ICD-10-CM | POA: Diagnosis not present

## 2015-06-22 ENCOUNTER — Other Ambulatory Visit: Payer: Self-pay

## 2015-06-22 DIAGNOSIS — K7460 Unspecified cirrhosis of liver: Secondary | ICD-10-CM

## 2015-07-06 DIAGNOSIS — N401 Enlarged prostate with lower urinary tract symptoms: Secondary | ICD-10-CM | POA: Diagnosis not present

## 2015-07-06 DIAGNOSIS — N529 Male erectile dysfunction, unspecified: Secondary | ICD-10-CM | POA: Diagnosis not present

## 2015-07-06 DIAGNOSIS — R351 Nocturia: Secondary | ICD-10-CM | POA: Diagnosis not present

## 2015-07-10 DIAGNOSIS — K7460 Unspecified cirrhosis of liver: Secondary | ICD-10-CM | POA: Diagnosis not present

## 2015-07-11 ENCOUNTER — Telehealth: Payer: Self-pay | Admitting: Internal Medicine

## 2015-07-11 LAB — COMPREHENSIVE METABOLIC PANEL
ALBUMIN: 4 g/dL (ref 3.6–5.1)
ALT: 25 U/L (ref 9–46)
AST: 35 U/L (ref 10–35)
Alkaline Phosphatase: 73 U/L (ref 40–115)
BUN: 15 mg/dL (ref 7–25)
CHLORIDE: 103 mmol/L (ref 98–110)
CO2: 22 mmol/L (ref 20–31)
CREATININE: 1.16 mg/dL — AB (ref 0.70–1.11)
Calcium: 9 mg/dL (ref 8.6–10.3)
GLUCOSE: 109 mg/dL — AB (ref 65–99)
Potassium: 4.2 mmol/L (ref 3.5–5.3)
SODIUM: 138 mmol/L (ref 135–146)
Total Bilirubin: 0.9 mg/dL (ref 0.2–1.2)
Total Protein: 6.3 g/dL (ref 6.1–8.1)

## 2015-07-11 LAB — CBC WITH DIFFERENTIAL/PLATELET
BASOS ABS: 0 {cells}/uL (ref 0–200)
Basophils Relative: 0 %
EOS PCT: 3 %
Eosinophils Absolute: 102 cells/uL (ref 15–500)
HCT: 33.6 % — ABNORMAL LOW (ref 38.5–50.0)
Hemoglobin: 11.5 g/dL — ABNORMAL LOW (ref 13.2–17.1)
Lymphocytes Relative: 27 %
Lymphs Abs: 918 cells/uL (ref 850–3900)
MCH: 32.9 pg (ref 27.0–33.0)
MCHC: 34.2 g/dL (ref 32.0–36.0)
MCV: 96 fL (ref 80.0–100.0)
MONOS PCT: 10 %
MPV: 11.8 fL (ref 7.5–12.5)
Monocytes Absolute: 340 cells/uL (ref 200–950)
NEUTROS PCT: 60 %
Neutro Abs: 2040 cells/uL (ref 1500–7800)
PLATELETS: 143 10*3/uL (ref 140–400)
RBC: 3.5 MIL/uL — ABNORMAL LOW (ref 4.20–5.80)
RDW: 14 % (ref 11.0–15.0)
WBC: 3.4 10*3/uL — AB (ref 3.8–10.8)

## 2015-07-11 NOTE — Telephone Encounter (Signed)
July recall for abdominal ultrasound

## 2015-07-11 NOTE — Telephone Encounter (Signed)
Letter mailed

## 2015-07-17 DIAGNOSIS — J069 Acute upper respiratory infection, unspecified: Secondary | ICD-10-CM | POA: Diagnosis not present

## 2015-07-17 DIAGNOSIS — M1991 Primary osteoarthritis, unspecified site: Secondary | ICD-10-CM | POA: Diagnosis not present

## 2015-07-17 DIAGNOSIS — R070 Pain in throat: Secondary | ICD-10-CM | POA: Diagnosis not present

## 2015-07-17 DIAGNOSIS — J209 Acute bronchitis, unspecified: Secondary | ICD-10-CM | POA: Diagnosis not present

## 2015-07-17 DIAGNOSIS — Z6829 Body mass index (BMI) 29.0-29.9, adult: Secondary | ICD-10-CM | POA: Diagnosis not present

## 2015-07-17 DIAGNOSIS — E6609 Other obesity due to excess calories: Secondary | ICD-10-CM | POA: Diagnosis not present

## 2015-07-17 DIAGNOSIS — J343 Hypertrophy of nasal turbinates: Secondary | ICD-10-CM | POA: Diagnosis not present

## 2015-07-17 DIAGNOSIS — E669 Obesity, unspecified: Secondary | ICD-10-CM | POA: Diagnosis not present

## 2015-07-20 NOTE — Progress Notes (Signed)
Quick Note:  Labs stable. Keep ov for 10/2015. ______

## 2015-07-26 ENCOUNTER — Other Ambulatory Visit: Payer: Self-pay | Admitting: Internal Medicine

## 2015-07-26 NOTE — Telephone Encounter (Signed)
Rx(s) sent to pharmacy electronically.  

## 2015-08-30 ENCOUNTER — Telehealth: Payer: Self-pay | Admitting: Internal Medicine

## 2015-08-30 NOTE — Telephone Encounter (Signed)
Patient calling the office for samples of medication:   1.  What medication and dosage are you requesting samples for? Lavasia 78m  2.  Are you currently out of this medication? yes

## 2015-08-30 NOTE — Telephone Encounter (Signed)
New message    Patient calling the office for samples of medication:   1.  What medication and dosage are you requesting samples for? Sayaysa, 60 mg   2.  Are you currently out of this medication? No but wants to pick up some. He is about out of it.

## 2015-08-30 NOTE — Telephone Encounter (Signed)
Returned call to patient-pt made aware our office is out of samples of Savaysa 58m at this time.  Pt reports [facility] will [facility] and see if [facility] have any samples available.  Pt states he is not out of the medication at this time.  Advised to call with further questions and/or concerns.

## 2015-08-30 NOTE — Telephone Encounter (Signed)
Spoke with pt, he is needing savaysa samples. None available

## 2015-09-06 ENCOUNTER — Other Ambulatory Visit (HOSPITAL_COMMUNITY): Payer: Self-pay | Admitting: Family Medicine

## 2015-09-06 ENCOUNTER — Ambulatory Visit (HOSPITAL_COMMUNITY)
Admission: RE | Admit: 2015-09-06 | Discharge: 2015-09-06 | Disposition: A | Discharge status: Home / Self Care | Payer: Medicare Other | Source: Ambulatory Visit | Attending: Family Medicine | Admitting: Family Medicine

## 2015-09-06 DIAGNOSIS — M25561 Pain in right knee: Secondary | ICD-10-CM | POA: Diagnosis not present

## 2015-09-06 DIAGNOSIS — M11261 Other chondrocalcinosis, right knee: Secondary | ICD-10-CM | POA: Insufficient documentation

## 2015-09-06 DIAGNOSIS — Z6829 Body mass index (BMI) 29.0-29.9, adult: Secondary | ICD-10-CM | POA: Diagnosis not present

## 2015-09-06 DIAGNOSIS — H00012 Hordeolum externum right lower eyelid: Secondary | ICD-10-CM | POA: Diagnosis not present

## 2015-10-09 DIAGNOSIS — L570 Actinic keratosis: Secondary | ICD-10-CM | POA: Diagnosis not present

## 2015-10-22 ENCOUNTER — Encounter: Payer: Self-pay | Admitting: Gastroenterology

## 2015-10-22 ENCOUNTER — Ambulatory Visit (INDEPENDENT_AMBULATORY_CARE_PROVIDER_SITE_OTHER): Payer: Medicare Other | Admitting: Gastroenterology

## 2015-10-22 VITALS — BP 139/61 | HR 73 | Temp 97.6°F | Ht 69.0 in | Wt 198.4 lb

## 2015-10-22 DIAGNOSIS — K769 Liver disease, unspecified: Secondary | ICD-10-CM | POA: Diagnosis not present

## 2015-10-22 DIAGNOSIS — R140 Abdominal distension (gaseous): Secondary | ICD-10-CM

## 2015-10-22 DIAGNOSIS — K219 Gastro-esophageal reflux disease without esophagitis: Secondary | ICD-10-CM | POA: Diagnosis not present

## 2015-10-22 NOTE — Patient Instructions (Signed)
1. We will arrange for ultrasound of your liver, breath test for bloating, and labs for 12/2015.

## 2015-10-22 NOTE — Assessment & Plan Note (Signed)
82 year old male with evidence of cirrhosis, portal hypertension with esophageal varices likely due to fatty liver. Presents back for cirrhosis care. Currently completing hepatitis A and B vaccinations through the [place]. He is due for labs and hepatoma screening at this time. He would like to put off until November so that he can have a break from doctors. Patient's biggest complaint is that of ongoing bloating. Occurs within bites of his meals. Creates lots of belching and flatulence. Previous celiac screening negative. He has failed multiple over-the-counter regimens for gas including probiotics. EGD and colonoscopy up-to-date. Consider small bowel bacterial overgrowth as etiology. Offered hydrogen breath test versus empiric trial of antibiotic therapy. Patient like breath testing performed. He would like to put off until November however.

## 2015-10-22 NOTE — Progress Notes (Signed)
cc'ed to pcp °

## 2015-10-22 NOTE — Progress Notes (Signed)
Primary Care Physician: Glo Herring., MD  Primary Gastroenterologist:  Garfield Cornea, MD   Chief Complaint  Patient presents with  . Follow-up    HPI: [name] is a 82 y.o. male here for six-month follow-up. Last seen in March 2017. History of hepatosplenomegaly, mild anemia, thrombocytopenia. Followed by hematology for years.He had u/s with elastography January 2017 showed hepatic steatosis, Metavir F3/F4. He underwent a colonoscopy for anemia, heme positive stool. Had pancolonic diverticulosis. He underwent an EGD for dysphagia, bloating, screen for varices. He had grade 1 esophageal varices, 3 columns. Hiatal hernia, fundic gland polyp. No H pylori.  Patient is due for right upper quadrant ultrasound for hepatoma surveillance. Due for updated labs at this time. He has one last shot to complete his Hep A/B vaccines, done thru [place].  Patient continues to complain of a lot of bloating. Starts within three bites of meal. Has immediate flatulence and belching. Has not responded to probiotics. Tried lactose free milk and ice cream without relief. Celiac screen was negative. GERD ok. Three bites then gas. Gas x Doesn't help. This dates back for several years. BM every day. No melena, brbpr.     Current Outpatient Prescriptions  Medication Sig Dispense Refill  . ALPRAZolam (XANAX) 0.5 MG tablet Take 0.5 mg by mouth at bedtime as needed. Sleep.    Marland Kitchen amLODipine (NORVASC) 5 MG tablet Take 1 tablet (5 mg total) by mouth 2 (two) times daily. 180 tablet 3  . doxazosin (CARDURA XL) 8 MG 24 hr tablet Take 1 tablet (8 mg total) by mouth at bedtime.    . edoxaban (SAVAYSA) 60 MG TABS tablet Take 60 mg by mouth daily. 28 tablet 9  . flunisolide (NASALIDE) 0.025 % SOLN Inhale 2 sprays into the lungs daily.     Marland Kitchen gabapentin (NEURONTIN) 300 MG capsule Take 300 mg by mouth 3 (three) times daily.      Marland Kitchen levothyroxine (SYNTHROID, LEVOTHROID) 50 MCG tablet Take 50 mcg by mouth daily before  breakfast.    . metoprolol tartrate (LOPRESSOR) 25 MG tablet Take 0.5 tablets (12.5 mg total) by mouth 2 (two) times daily. <PLEASE MAKE APPOINTMENT FOR REFILLS> 90 tablet 0  . naproxen (NAPROSYN) 500 MG tablet Take 1 tablet (500 mg total) by mouth daily. 90 tablet 1  . pantoprazole (PROTONIX) 40 MG tablet Take 40 mg by mouth daily as needed. Acid reflux    . Probiotic Product (ALIGN) 4 MG CAPS Take 4 mg by mouth daily. 21 capsule 0   No current facility-administered medications for this visit.     Allergies as of 10/22/2015 - Review Complete 10/22/2015  Allergen Reaction Noted  . Antihistamines, chlorpheniramine-type Rash 08/26/2011   Past Medical History:  Diagnosis Date  . BPH (benign prostatic hyperplasia)   . CAD (coronary artery disease) of artery bypass graft 2006  . Fatty liver   . GERD (gastroesophageal reflux disease)   . Gilbert's syndrome   . Hiatal hernia   . History of nuclear stress test 11/2007   bruce myoview; normal pattern of persuion in all regions; post-stress EF 70%; low risk   . HTN (hypertension)   . Neuropathy ([place])   . PAF (paroxysmal atrial fibrillation) ([place])   . PVD (peripheral vascular disease) ([place])    0-49% R & L ICA stenosis (2013)   . Renal artery aneurysm ([place])   . S/P CABG (coronary artery bypass graft) 2006  . Schatzki's ring    non critical  .  Thrombocytopenia ([place])    Past Surgical History:  Procedure Laterality Date  . CATARACT EXTRACTION  2011  . COLONOSCOPY  2006   Dr. Margarito Courser pancolonic diverticula  . COLONOSCOPY  01/07/2012   Dr. Abbe Amsterdam normal rectum, pancolonic diverticulosis, hyperplastic polyp  . COLONOSCOPY N/A 03/20/2015   [id number] diverticulosis  . CORONARY ARTERY BYPASS GRAFT  04/2004   LIMA to diagonal, SVG to ramus intermedius, SVG to PDA; placed stent for crossed coronary arteries (Dr. Prescott Gum)  . ESOPHAGOGASTRODUODENOSCOPY  2006   Dr. Ruthell Rummage Schatzki's ring, s/p 58-F Maloney dilation    . ESOPHAGOGASTRODUODENOSCOPY  01/07/2012   Dr. Gareth Morgan ring, hiatal hernia, fundic gland polyp  . ESOPHAGOGASTRODUODENOSCOPY N/A 03/20/2015   NWG:NFAOZ 1 varices/HH Gastric polpy s/p bx  . rotater cuff     repair  . TONSILLECTOMY      ROS:  General: Negative for anorexia, weight loss, fever, chills, fatigue, weakness. ENT: Negative for hoarseness, difficulty swallowing , nasal congestion. CV: Negative for chest pain, angina, palpitations, dyspnea on exertion, peripheral edema.  Respiratory: Negative for dyspnea at rest, dyspnea on exertion, cough, sputum, wheezing.  GI: See history of present illness. GU:  Negative for dysuria, hematuria, urinary incontinence, urinary frequency, nocturnal urination.  Endo: Negative for unusual weight change.    Physical Examination:   BP 139/61   Pulse 73   Temp 97.6 F (36.4 C) (Oral)   Ht 5' 9"  (1.753 m)   Wt 198 lb 6.4 oz (90 kg)   BMI 29.30 kg/m   General: Well-nourished, well-developed in no acute distress.  Eyes: No icterus. Mouth: Oropharyngeal mucosa moist and pink , no lesions erythema or exudate. Lungs: Clear to auscultation bilaterally.  Heart: Regular rate and rhythm, no murmurs rubs or gallops.  Abdomen: Bowel sounds are normal, rectus diastasis, nontender, nondistended, no hepatosplenomegaly or masses, no abdominal bruits or hernia , no rebound or guarding.   Extremities: No lower extremity edema. No clubbing or deformities. Neuro: Alert and oriented x 4   Skin: Warm and dry, no jaundice.   Psych: Alert and cooperative, normal mood and affect.  Labs:  Lab Results  Component Value Date   WBC 3.4 (L) 07/10/2015   HGB 11.5 (L) 07/10/2015   HCT 33.6 (L) 07/10/2015   MCV 96.0 07/10/2015   PLT 143 07/10/2015   Lab Results  Component Value Date   CREATININE 1.16 (H) 07/10/2015   BUN 15 07/10/2015   NA 138 07/10/2015   K 4.2 07/10/2015   CL 103 07/10/2015   CO2 22 07/10/2015   Lab Results  Component Value  Date   ALT 25 07/10/2015   AST 35 07/10/2015   ALKPHOS 73 07/10/2015   BILITOT 0.9 07/10/2015    Imaging Studies: No results found.

## 2015-10-31 ENCOUNTER — Other Ambulatory Visit: Payer: Self-pay | Admitting: Internal Medicine

## 2015-10-31 NOTE — Telephone Encounter (Signed)
Rx(s) sent to pharmacy electronically.  

## 2015-11-01 ENCOUNTER — Other Ambulatory Visit: Payer: Self-pay | Admitting: Gastroenterology

## 2015-11-01 DIAGNOSIS — K769 Liver disease, unspecified: Secondary | ICD-10-CM

## 2015-11-02 ENCOUNTER — Telehealth: Payer: Self-pay | Admitting: Pharmacist Clinician (PhC)/ Clinical Pharmacy Specialist

## 2015-11-02 NOTE — Telephone Encounter (Signed)
Patient called, concerned that diastolic BP may be going too low.  Has had a couple of readings down to 46-48.  Currently taking amlodipine 2.5 mg once or twice daily, depending on readings.  Also takes doxazosin 8 mg each night for prostate/BP.    Advised that he take the amlodipine 2.5 mg only once daily, and also to cut the doxazosin from 8 mg to 4 mg daily.  He will continue to monitor home readings.   Also gave samples of Savaysa as well as called into [facility] in [place].  Samples of this drug were given to the patient, quantity 4 x 7, Lot Number [id number]

## 2015-11-06 ENCOUNTER — Other Ambulatory Visit: Payer: Self-pay

## 2015-11-06 DIAGNOSIS — Z23 Encounter for immunization: Secondary | ICD-10-CM | POA: Diagnosis not present

## 2015-11-06 DIAGNOSIS — K769 Liver disease, unspecified: Secondary | ICD-10-CM

## 2015-11-07 ENCOUNTER — Emergency Department (HOSPITAL_COMMUNITY)
Admission: EM | Admit: 2015-11-07 | Discharge: 2015-11-07 | Disposition: A | Discharge status: Home / Self Care | Payer: Medicare Other | Attending: Emergency Medicine | Admitting: Emergency Medicine

## 2015-11-07 ENCOUNTER — Emergency Department (HOSPITAL_COMMUNITY): Payer: Medicare Other

## 2015-11-07 ENCOUNTER — Encounter (HOSPITAL_COMMUNITY): Payer: Self-pay | Admitting: Emergency Medicine

## 2015-11-07 DIAGNOSIS — I10 Essential (primary) hypertension: Secondary | ICD-10-CM | POA: Diagnosis not present

## 2015-11-07 DIAGNOSIS — Z79899 Other long term (current) drug therapy: Secondary | ICD-10-CM | POA: Insufficient documentation

## 2015-11-07 DIAGNOSIS — R531 Weakness: Secondary | ICD-10-CM

## 2015-11-07 DIAGNOSIS — I2510 Atherosclerotic heart disease of native coronary artery without angina pectoris: Secondary | ICD-10-CM | POA: Diagnosis not present

## 2015-11-07 DIAGNOSIS — Z87891 Personal history of nicotine dependence: Secondary | ICD-10-CM | POA: Diagnosis not present

## 2015-11-07 LAB — CBC
HCT: 35.4 % — ABNORMAL LOW (ref 39.0–52.0)
Hemoglobin: 11.9 g/dL — ABNORMAL LOW (ref 13.0–17.0)
MCH: 33.7 pg (ref 26.0–34.0)
MCHC: 33.6 g/dL (ref 30.0–36.0)
MCV: 100.3 fL — ABNORMAL HIGH (ref 78.0–100.0)
PLATELETS: 142 10*3/uL — AB (ref 150–400)
RBC: 3.53 MIL/uL — AB (ref 4.22–5.81)
RDW: 12.3 % (ref 11.5–15.5)
WBC: 8.7 10*3/uL (ref 4.0–10.5)

## 2015-11-07 LAB — BASIC METABOLIC PANEL
Anion gap: 7 (ref 5–15)
BUN: 18 mg/dL (ref 6–20)
CO2: 26 mmol/L (ref 22–32)
CREATININE: 1.59 mg/dL — AB (ref 0.61–1.24)
Calcium: 8.9 mg/dL (ref 8.9–10.3)
Chloride: 102 mmol/L (ref 101–111)
GFR calc non Af Amer: 39 mL/min — ABNORMAL LOW (ref >60)
GFR, EST AFRICAN AMERICAN: 46 mL/min — AB (ref >60)
Glucose, Bld: 101 mg/dL — ABNORMAL HIGH (ref 65–99)
Potassium: 4.1 mmol/L (ref 3.5–5.1)
SODIUM: 135 mmol/L (ref 135–145)

## 2015-11-07 LAB — URINALYSIS, ROUTINE W REFLEX MICROSCOPIC
Bilirubin Urine: NEGATIVE
Glucose, UA: NEGATIVE mg/dL
Hgb urine dipstick: NEGATIVE
KETONES UR: NEGATIVE mg/dL
LEUKOCYTES UA: NEGATIVE
NITRITE: NEGATIVE
PROTEIN: NEGATIVE mg/dL
Specific Gravity, Urine: 1.01 (ref 1.005–1.030)
pH: 5.5 (ref 5.0–8.0)

## 2015-11-07 LAB — HEPATIC FUNCTION PANEL
ALBUMIN: 4.2 g/dL (ref 3.5–5.0)
ALT: 26 U/L (ref 17–63)
AST: 37 U/L (ref 15–41)
Alkaline Phosphatase: 72 U/L (ref 38–126)
BILIRUBIN DIRECT: 0.4 mg/dL (ref 0.1–0.5)
Indirect Bilirubin: 1.5 mg/dL — ABNORMAL HIGH (ref 0.3–0.9)
Total Bilirubin: 1.9 mg/dL — ABNORMAL HIGH (ref 0.3–1.2)
Total Protein: 7.2 g/dL (ref 6.5–8.1)

## 2015-11-07 LAB — TROPONIN I

## 2015-11-07 MED ORDER — SODIUM CHLORIDE 0.9 % IV BOLUS (SEPSIS)
500.0000 mL | Freq: Once | INTRAVENOUS | Status: AC
  Administered 2015-11-07: 500 mL via INTRAVENOUS

## 2015-11-07 NOTE — ED Provider Notes (Signed)
[facility] DEPT Provider Note   CSN: [id number] Arrival date & time: 11/07/15  1545     History   Chief Complaint Chief Complaint  Patient presents with  . Weakness    HPI [name] is a 82 y.o. male.  The patient states that today he felt just weak and tired after playing 9 holes of golf. The patient states that he got his flu and his Pneumovax shots yesterday his arm is  sore   The history is provided by the patient. No language interpreter was used.  Weakness  Primary symptoms comment: General weakness. This is a new problem. The current episode started more than 1 week ago. The problem has not changed since onset.There was no focality noted. There has been no fever. Pertinent negatives include no shortness of breath, no chest pain and no headaches. There were no medications administered prior to arrival. Associated medical issues do not include trauma.    Past Medical History:  Diagnosis Date  . BPH (benign prostatic hyperplasia)   . CAD (coronary artery disease) of artery bypass graft 2006  . Fatty liver   . GERD (gastroesophageal reflux disease)   . Gilbert's syndrome   . Hiatal hernia   . History of nuclear stress test 11/2007   bruce myoview; normal pattern of persuion in all regions; post-stress EF 70%; low risk   . HTN (hypertension)   . Neuropathy ([place])   . PAF (paroxysmal atrial fibrillation) ([place])   . PVD (peripheral vascular disease) ([place])    0-49% R & L ICA stenosis (2013)   . Renal artery aneurysm ([place])   . S/P CABG (coronary artery bypass graft) 2006  . Schatzki's ring    non critical  . Thrombocytopenia [facility])     Patient Active Problem List   Diagnosis Date Noted  . Esophageal varices without bleeding ([place]) 04/17/2015  . Liver disease   . Diverticulosis of colon without hemorrhage   . Gastric polyp   . Secondary esophageal varices without bleeding ([place])   . Esophageal dysphagia 02/13/2015  . Heme positive stool 02/13/2015  .  Hepatosplenomegaly 02/13/2015  . Anemia 02/13/2015  . Renal artery aneurysm ([place]) 11/26/2014  . Fatty liver 10/03/2014  . Splenomegaly 10/03/2014  . Postural hypotension 04/15/2013  . Weakness generalized 04/15/2013  . Acute kidney injury ([place]) 04/15/2013  . Hypotension 04/15/2013  . Atrial fibrillation with RVR ([place]) 04/15/2013  . Hypotension, postural 04/15/2013  . CAD (coronary artery disease) 01/07/2013  . S/P CABG x 3 01/07/2013  . HTN (hypertension) 01/07/2013  . Carotid stenosis 01/07/2013  . Thrombocytopenia ([place]) 10/20/2012  . Bloating symptom 12/12/2011  . Nausea 05/08/2010  . GERD (gastroesophageal reflux disease) 05/08/2010  . Diarrhea 05/08/2010    Past Surgical History:  Procedure Laterality Date  . CATARACT EXTRACTION  2011  . COLONOSCOPY  2006   Dr. Margarito Courser pancolonic diverticula  . COLONOSCOPY  01/07/2012   Dr. Abbe Amsterdam normal rectum, pancolonic diverticulosis, hyperplastic polyp  . COLONOSCOPY N/A 03/20/2015   BEE:[id number] diverticulosis  . CORONARY ARTERY BYPASS GRAFT  04/2004   LIMA to diagonal, SVG to ramus intermedius, SVG to PDA; placed stent for crossed coronary arteries (Dr. Prescott Gum)  . ESOPHAGOGASTRODUODENOSCOPY  2006   Dr. Ruthell Rummage Schatzki's ring, s/p 58-F Maloney dilation  . ESOPHAGOGASTRODUODENOSCOPY  01/07/2012   Dr. Gareth Morgan ring, hiatal hernia, fundic gland polyp  . ESOPHAGOGASTRODUODENOSCOPY N/A 03/20/2015   OIT:GPQDI 1 varices/HH Gastric polpy s/p bx  . rotater cuff     repair  .  TONSILLECTOMY         Home Medications    Prior to Admission medications   Medication Sig Start Date End Date Taking? Authorizing Provider  allopurinol (ZYLOPRIM) 300 MG tablet Take 300 mg by mouth 4 (four) times daily as needed (for gout flare up).   Yes Historical Provider, MD  ALPRAZolam Duanne Moron) 0.5 MG tablet Take 0.5 mg by mouth at bedtime as needed. Sleep.   Yes Historical Provider, MD  amLODipine (NORVASC) 5 MG tablet  Take 1 tablet (5 mg total) by mouth 2 (two) times daily. Patient taking differently: Take 2.5 mg by mouth daily.  10/07/13  Yes Pixie Casino, MD  doxazosin (CARDURA) 8 MG tablet Take 4 mg by mouth daily.   Yes Historical Provider, MD  flunisolide (NASALIDE) 0.025 % SOLN Inhale 2 sprays into the lungs daily.    Yes Historical Provider, MD  gabapentin (NEURONTIN) 300 MG capsule Take 300 mg by mouth 3 (three) times daily.     Yes Historical Provider, MD  Influenza vac split quadrivalent PF (FLUARIX) 0.5 ML injection Inject 0.5 mLs into the muscle once.   Yes Historical Provider, MD  levothyroxine (SYNTHROID, LEVOTHROID) 50 MCG tablet Take 50 mcg by mouth daily before breakfast.   Yes Historical Provider, MD  metoprolol tartrate (LOPRESSOR) 25 MG tablet Take 0.5 tablets (12.5 mg total) by mouth 2 (two) times daily. <PLEASE MAKE APPOINTMENT FOR REFILLS> 07/26/15  Yes Pixie Casino, MD  pantoprazole (PROTONIX) 40 MG tablet Take 40 mg by mouth daily as needed. Acid reflux   Yes Historical Provider, MD  pneumococcal 23 valent vaccine (PNU-IMMUNE) 25 MCG/0.5ML injection Inject 0.5 mLs into the muscle once.   Yes Historical Provider, MD  Probiotic Product (ALIGN) 4 MG CAPS Take 4 mg by mouth daily. 10/03/14  Yes Mahala Menghini, PA-C  SAVAYSA 60 MG TABS tablet TAKE ONE TABLET BY MOUTH ONCE DAILY 10/31/15  Yes Pixie Casino, MD    Family History Family History  Problem Relation Age of Onset  . Heart attack Mother 37    deceased  . Heart attack Father 52    deceased  . Heart disease Brother     x2  . Diabetes Brother     x3  . Colon cancer Neg Hx     Social History Social History  Substance Use Topics  . Smoking status: Former Smoker    Packs/day: 1.00    Years: 40.00    Types: Cigarettes    Quit date: 04/07/2004  . Smokeless tobacco: Never Used  . Alcohol use Yes     Comment: occassional drinker     Allergies   Antihistamines, chlorpheniramine-type   Review of Systems Review of  Systems  Constitutional: Negative for appetite change and fatigue.  HENT: Negative for congestion, ear discharge and sinus pressure.   Eyes: Negative for discharge.  Respiratory: Negative for cough and shortness of breath.   Cardiovascular: Negative for chest pain.  Gastrointestinal: Negative for abdominal pain and diarrhea.  Genitourinary: Negative for frequency and hematuria.  Musculoskeletal: Negative for back pain.  Skin: Negative for rash.  Neurological: Positive for weakness. Negative for seizures and headaches.  Psychiatric/Behavioral: Negative for hallucinations.     Physical Exam Updated Vital Signs BP 134/79 (BP Location: Right Arm)   Pulse 87   Temp 98.8 F (37.1 C)   Resp 16   Ht 5' 9"  (1.753 m)   Wt 200 lb (90.7 kg)   SpO2 100%   BMI 29.53  kg/m   Physical Exam  Constitutional: He is oriented to person, place, and time. He appears well-developed.  HENT:  Head: Normocephalic.  Eyes: Conjunctivae and EOM are normal. No scleral icterus.  Neck: Neck supple. No thyromegaly present.  Cardiovascular: Normal rate and regular rhythm.  Exam reveals no gallop and no friction rub.   No murmur heard. Pulmonary/Chest: No stridor. He has no wheezes. He has no rales. He exhibits no tenderness.  Abdominal: He exhibits no distension. There is no tenderness. There is no rebound.  Musculoskeletal: Normal range of motion. He exhibits no edema.  Lymphadenopathy:    He has no cervical adenopathy.  Neurological: He is oriented to person, place, and time. He exhibits normal muscle tone. Coordination normal.  Skin: No rash noted. No erythema.  Psychiatric: He has a normal mood and affect. His behavior is normal.     ED Treatments / Results  Labs (all labs ordered are listed, but only abnormal results are displayed) Labs Reviewed  BASIC METABOLIC PANEL - Abnormal; Notable for the following:       Result Value   Glucose, Bld 101 (*)    Creatinine, Ser 1.59 (*)    GFR calc non  Af Amer 39 (*)    GFR calc Af Amer 46 (*)    All other components within normal limits  CBC - Abnormal; Notable for the following:    RBC 3.53 (*)    Hemoglobin 11.9 (*)    HCT 35.4 (*)    MCV 100.3 (*)    Platelets 142 (*)    All other components within normal limits  HEPATIC FUNCTION PANEL - Abnormal; Notable for the following:    Total Bilirubin 1.9 (*)    Indirect Bilirubin 1.5 (*)    All other components within normal limits  TROPONIN I  URINALYSIS, ROUTINE W REFLEX MICROSCOPIC (NOT AT  [facility])    EKG  EKG Interpretation  Date/Time:  Wednesday November 07 2015 15:59:20 EDT Ventricular Rate:  64 PR Interval:  230 QRS Duration: 110 QT Interval:  428 QTC Calculation: 441 R Axis:   -33 Text Interpretation:  Sinus rhythm with 1st degree A-V block Left axis deviation Abnormal ECG Confirmed by Dashanti Burr  MD, Broadus John [phone]) on 11/07/2015 6:09:23 PM Also confirmed by Kainoa Swoboda  MD, Broadus John [phone])  on 11/07/2015 8:19:31 PM       Radiology Dg Chest 2 View  Result Date: 11/07/2015 CLINICAL DATA:  Generalized weakness, hypertension, former smoker, coronary artery disease post CABG EXAM: CHEST  2 VIEW COMPARISON:  05/10/2013 FINDINGS: Normal heart size post CABG. Mediastinal contours and pulmonary vascularity normal. Minimal scarring RIGHT mid lung. Lungs otherwise clear. No infiltrate, pleural effusion or pneumothorax. Bones unremarkable. IMPRESSION: Post CABG. No acute abnormalities. Electronically Signed   By: Lavonia Dana M.D.   On: 11/07/2015 16:26    Procedures Procedures (including critical care time)  Medications Ordered in ED Medications  sodium chloride 0.9 % bolus 500 mL (500 mLs Intravenous New Bag/Given 11/07/15 1859)     Initial Impression / Assessment and Plan / ED Course  I have reviewed the triage vital signs and the nursing notes.  Pertinent labs & imaging results that were available during my care of the patient were reviewed by me and considered in my medical decision  making (see chart for details).  Clinical Course   Patient with general weakness labs x-ray EKG unremarkable. Patient seems to be improving. Suspect his weakness today may be related to recent immunizations yesterday  Final Clinical Impressions(s) / ED Diagnoses   Final diagnoses:  Weakness    New Prescriptions New Prescriptions   No medications on file     Milton Ferguson, MD 11/07/15 2028

## 2015-11-07 NOTE — ED Triage Notes (Signed)
Pt sent to ED from Urgent Care for abnormal EKG. Pt went to urgent care for generalized weakness today.

## 2015-11-07 NOTE — ED Notes (Signed)
Patient walked to the bathroom with minimal assistance.

## 2015-11-07 NOTE — Discharge Instructions (Signed)
Follow-up with your family doctor next week if not improving

## 2015-11-15 DIAGNOSIS — Z1389 Encounter for screening for other disorder: Secondary | ICD-10-CM | POA: Diagnosis not present

## 2015-11-15 DIAGNOSIS — E663 Overweight: Secondary | ICD-10-CM | POA: Diagnosis not present

## 2015-11-15 DIAGNOSIS — M5410 Radiculopathy, site unspecified: Secondary | ICD-10-CM | POA: Diagnosis not present

## 2015-11-15 DIAGNOSIS — M545 Low back pain: Secondary | ICD-10-CM | POA: Diagnosis not present

## 2015-11-15 DIAGNOSIS — Z6829 Body mass index (BMI) 29.0-29.9, adult: Secondary | ICD-10-CM | POA: Diagnosis not present

## 2016-01-04 ENCOUNTER — Encounter: Payer: Self-pay | Admitting: Internal Medicine

## 2016-01-04 ENCOUNTER — Ambulatory Visit (INDEPENDENT_AMBULATORY_CARE_PROVIDER_SITE_OTHER): Payer: Medicare Other | Admitting: Internal Medicine

## 2016-01-04 VITALS — BP 148/58 | HR 60 | Ht 69.0 in | Wt 206.0 lb

## 2016-01-04 DIAGNOSIS — Z951 Presence of aortocoronary bypass graft: Secondary | ICD-10-CM | POA: Diagnosis not present

## 2016-01-04 DIAGNOSIS — I4891 Unspecified atrial fibrillation: Secondary | ICD-10-CM | POA: Diagnosis not present

## 2016-01-04 DIAGNOSIS — I739 Peripheral vascular disease, unspecified: Secondary | ICD-10-CM | POA: Diagnosis not present

## 2016-01-04 DIAGNOSIS — R0989 Other specified symptoms and signs involving the circulatory and respiratory systems: Secondary | ICD-10-CM | POA: Diagnosis not present

## 2016-01-04 MED ORDER — RIVAROXABAN 20 MG PO TABS: 20.0000 mg | ORAL_TABLET | Freq: Every day | ORAL | 12 refills | Status: DC

## 2016-01-04 NOTE — Progress Notes (Signed)
OFFICE NOTE  Chief Complaint:  No complaints  Primary Care Physician: Glo Herring., MD  HPI:  [name] is a 82 year old gentleman who had bypass in 2006, a normal nuclear study in 2009 and has done well without any episodes of angina. He has remained active and does exercise several times a week and has had no worsening shortness of breath, palpitations, presyncope or syncopal symptoms. He also has a history of right carotid disease which he was told to be about 60% at the time of bypass, however, recent Dopplers show only mild disease. He does have a very faint bruit. He last saw Tarri Fuller, PA-C, in the office this past summer. He was having problems with elevated blood pressures at night. It was recommended that he change his blood pressure medications around, however he did not make that change.  He was recently admitted to [facility] for dehydration in the setting of pneumonia or viral influenza. He was taking his diuretics in addition to ongoing fluid losses. This cause orthostatic hypotension. His diuretic was held and his lisinopril was stopped due to worsening renal function. He was switched to amlodipine for better blood pressure control, but has not been taking the medicine due to confusion of his medicines. Currently reports his blood pressures have improved and is not have any significant swelling. In fact he is now hypertensive again. He is recently switched from warfarin over to Xarelto for better control of anticoagulation for his A. fib. He seems to be able to get the medication now with out undue cost.  [name] turns today for followup. He occasionally gets some lightheadedness. He's noted some blood pressures up into the 190s at home. He says however he feels bad when his blood pressure is more in the 120s to 130s.  I saw [name] back today in the office. Overall he reports doing fairly well. He decreased his exercise somewhat due to his wife being sick but  is interested in getting back into exercise routine. His last stress test was 7 years ago. His bypass was now 10 years ago. He denies any chest pain or worsening shortness of breath. Blood pressure is running somewhat high. His diuretics were stopped due to worsening renal function which seems to have normalized.  [name] returns today for follow-up. His main complaint has to do with pain in his back and decreased function in his legs. He says it's very difficult for him to get around. Although he has pain in his legs his symptoms are not necessarily consistent with claudication. He does have a history of decreased ABIs in the past and has not had lower extremity arterial Dopplers in some time. I was also recently reminded that he does have a history of renal artery aneurysm which was noted on the CT scan. This has not been reassessed in several years. He denies any significant symptoms with atrial fibrillation has had no bleeding problems on Savaysa.  01/04/16  [name] was seen today in follow-up. He denies any worsening chest pain or shortness of breath. He continues to have some tightness in his legs. He had normal ABIs by Dopplers last year however will need a repeat of that. His renal Dopplers also failed to indicate any stenosis. He is not in atrial fibrillation today and is concerned about the cost of Savaysa. He is interested in switching NOACs. He also brought lab work from the [place] today which showed adequate thyroid levels, elevated uric acid at 8.1  and LDL of 27 with total cholesterol of 115. He is not on statins.  PMHx:  Past Medical History:  Diagnosis Date  . BPH (benign prostatic hyperplasia)   . CAD (coronary artery disease) of artery bypass graft 2006  . Fatty liver   . GERD (gastroesophageal reflux disease)   . Gilbert's syndrome   . Hiatal hernia   . History of nuclear stress test 11/2007   bruce myoview; normal pattern of persuion in all regions; post-stress EF 70%; low risk    . HTN (hypertension)   . Neuropathy ([place])   . PAF (paroxysmal atrial fibrillation) ([place])   . PVD (peripheral vascular disease) ([place])    0-49% R & L ICA stenosis (2013)   . Renal artery aneurysm ([place])   . S/P CABG (coronary artery bypass graft) 2006  . Schatzki's ring    non critical  . Thrombocytopenia ([place])     Past Surgical History:  Procedure Laterality Date  . CATARACT EXTRACTION  2011  . COLONOSCOPY  2006   Dr. Margarito Courser pancolonic diverticula  . COLONOSCOPY  01/07/2012   Dr. Abbe Amsterdam normal rectum, pancolonic diverticulosis, hyperplastic polyp  . COLONOSCOPY N/A 03/20/2015   [id number] diverticulosis  . CORONARY ARTERY BYPASS GRAFT  04/2004   LIMA to diagonal, SVG to ramus intermedius, SVG to PDA; placed stent for crossed coronary arteries (Dr. Prescott Gum)  . ESOPHAGOGASTRODUODENOSCOPY  2006   Dr. Ruthell Rummage Schatzki's ring, s/p 58-F Maloney dilation  . ESOPHAGOGASTRODUODENOSCOPY  01/07/2012   Dr. Gareth Morgan ring, hiatal hernia, fundic gland polyp  . ESOPHAGOGASTRODUODENOSCOPY N/A 03/20/2015   FGH:WEXHB 1 varices/HH Gastric polpy s/p bx  . rotater cuff     repair  . TONSILLECTOMY      FAMHx:  Family History  Problem Relation Age of Onset  . Heart attack Mother 58    deceased  . Heart attack Father 53    deceased  . Heart disease Brother     x2  . Diabetes Brother     x3  . Colon cancer Neg Hx     SOCHx:   reports that he quit smoking about 11 years ago. His smoking use included Cigarettes. He has a 40.00 pack-year smoking history. He has never used smokeless tobacco. He reports that he drinks alcohol. He reports that he does not use drugs.  ALLERGIES:  Allergies  Allergen Reactions  . Antihistamines, Chlorpheniramine-Type Rash    Patient states he cant take large doses of antihistamines    ROS: Pertinent items noted in HPI and remainder of comprehensive ROS otherwise negative.  HOME MEDS: Current Outpatient Prescriptions    Medication Sig Dispense Refill  . allopurinol (ZYLOPRIM) 300 MG tablet Take 300 mg by mouth 4 (four) times daily as needed (for gout flare up).    . ALPRAZolam (XANAX) 0.5 MG tablet Take 0.5 mg by mouth at bedtime as needed. Sleep.    Marland Kitchen amLODipine (NORVASC) 5 MG tablet Take 1 tablet (5 mg total) by mouth 2 (two) times daily. (Patient taking differently: Take 2.5 mg by mouth daily. ) 180 tablet 3  . doxazosin (CARDURA) 8 MG tablet Take 4 mg by mouth daily.    . flunisolide (NASALIDE) 0.025 % SOLN Inhale 2 sprays into the lungs daily.     Marland Kitchen gabapentin (NEURONTIN) 300 MG capsule Take 300 mg by mouth 3 (three) times daily.      . Influenza vac split quadrivalent PF (FLUARIX) 0.5 ML injection Inject 0.5 mLs into the muscle once.    Marland Kitchen  levothyroxine (SYNTHROID, LEVOTHROID) 50 MCG tablet Take by mouth daily before breakfast. Pt takes 75 mg    . metoprolol tartrate (LOPRESSOR) 25 MG tablet Take 0.5 tablets (12.5 mg total) by mouth 2 (two) times daily. <PLEASE MAKE APPOINTMENT FOR REFILLS> 90 tablet 0  . pantoprazole (PROTONIX) 40 MG tablet Take 40 mg by mouth daily as needed. Acid reflux    . pneumococcal 23 valent vaccine (PNU-IMMUNE) 25 MCG/0.5ML injection Inject 0.5 mLs into the muscle once.    . Probiotic Product (ALIGN) 4 MG CAPS Take 4 mg by mouth daily. 21 capsule 0  . rivaroxaban (XARELTO) 20 MG TABS tablet Take 1 tablet (20 mg total) by mouth daily with supper. 30 tablet 12   No current facility-administered medications for this visit.     LABS/IMAGING: No results found for this or any previous visit (from the past 48 hour(s)). No results found.  VITALS: BP (!) 148/58   Pulse 60   Ht 5' 9"  (1.753 m)   Wt 206 lb (93.4 kg)   BMI 30.42 kg/m   EXAM: General appearance: alert and no distress Neck: no carotid bruit and no JVD Lungs: clear to auscultation bilaterally Heart: regular rate and rhythm, S1, S2 normal, no murmur, click, rub or gallop Abdomen: soft, non-tender; bowel sounds  normal; no masses,  no organomegaly Extremities: extremities normal, atraumatic, no cyanosis or edema Pulses: 2+ and symmetric Skin: Skin color, texture, turgor normal. No rashes or lesions Neurologic: Grossly normal Psych: Mood, affect normal  EKG: Sinus rhythm with first-degree AV block, minimal voltage criteria for LVH at 60   ASSESSMENT: 1. Coronary artery disease status post three-vessel CABG in 2006 (LIMA to diagonal, SVG to PDA and SVG to ramus intermedius) 2. Hypertension-uncontrolled 3. Mild carotid artery disease 4. Mild dyspnea with marked exertion 5. PAF-on Savaysa 6. Leg pain and weakness with walking 7. Renal Artery aneurysm  PLAN: 1.   [name] seems to be doing well and he is asymptomatic at this time. Blood pressures controlled. He is due for repeat lower extremity arterial and carotid Dopplers. He does wish to switch blood thinners to Xarelto. Will provide him with a 30 day free card and hopefully this will save him some money. Recent creatinine was normal therefore he'll be on the 20 mg daily dose. Although he has history of renal artery aneurysm, renal Dopplers recently were normal.  Follow-up annually or sooner as necessary.  Pixie Casino, MD, [facility] Attending Cardiologist [place] 01/04/2016, 2:33 PM

## 2016-01-04 NOTE — Patient Instructions (Signed)
Medication Instructions:   STOP SAVAYSA  START XARELTO 20 MG ONCE DAILY WITH FOOD  Testing/Procedures:  Your physician has requested that you have a lower extremity arterial duplex. During this test, ultrasound are used to evaluate arterial blood flow in the legs. Allow one hour for this exam. There are no restrictions or special instructions.   Your physician has requested that you have a carotid duplex. This test is an ultrasound of the carotid arteries in your neck. It looks at blood flow through these arteries that supply the brain with blood. Allow one hour for this exam. There are no restrictions or special instructions.    Follow-Up:  Your physician wants you to follow-up in: [place] will receive a reminder letter in the mail two months in advance. If you don't receive a letter, please call our office to schedule the follow-up appointment.   If you need a refill on your cardiac medications before your next appointment, please call your pharmacy.

## 2016-01-18 ENCOUNTER — Telehealth: Payer: Self-pay | Admitting: Internal Medicine

## 2016-01-18 MED ORDER — EDOXABAN TOSYLATE 60 MG PO TABS: 60.0000 mg | ORAL_TABLET | Freq: Every day | ORAL | 1 refills | Status: DC

## 2016-01-18 NOTE — Telephone Encounter (Signed)
Spoke to patient who informs me he cannot afford Xarelto due to not having Rx coverage. States he is still taking Savaysa, for which he has about a week remaining, and has not transitioned over to Xarelto, for which he has samples.  Informed pt I would reach out to coumadin clinic for options - he states he can get coumadin free through [place] and is aware that they can also manage his level checks.

## 2016-01-18 NOTE — Telephone Encounter (Signed)
Pt called back and needs the New script in writing and why he should take it fax.   To [facility] Butch Penny [phone]  Fax    Phone  [phone]

## 2016-01-18 NOTE — Telephone Encounter (Signed)
Spoke to pt and he prefers to remain on Savaysa. He has been tolerating well. Based on recent kidney function he will need to return to Savaysa 69m daily. Will fax RX to number provided once signed by Dr. HDebara Pickett   RX faxed.

## 2016-01-18 NOTE — Telephone Encounter (Signed)
Pt called and would like a call back about his Medication XARELTO.

## 2016-01-24 ENCOUNTER — Telehealth: Payer: Self-pay | Admitting: Internal Medicine

## 2016-01-24 DIAGNOSIS — M10072 Idiopathic gout, left ankle and foot: Secondary | ICD-10-CM | POA: Diagnosis not present

## 2016-01-24 DIAGNOSIS — M79672 Pain in left foot: Secondary | ICD-10-CM | POA: Diagnosis not present

## 2016-01-24 MED ORDER — APIXABAN 5 MG PO TABS: 5.0000 mg | ORAL_TABLET | Freq: Two times a day (BID) | ORAL | 3 refills | Status: AC

## 2016-01-24 MED ORDER — APIXABAN 5 MG PO TABS: 5.0000 mg | ORAL_TABLET | Freq: Two times a day (BID) | ORAL | 3 refills | Status: DC

## 2016-01-24 NOTE — Telephone Encounter (Signed)
Rx printed and D[place] Dates, CMA will have MD sign Rx 12/22

## 2016-01-24 NOTE — Telephone Encounter (Signed)
Please call,pt insurance will now only pay for Eliquis or Xarelto. Which one will be the best for him?

## 2016-01-24 NOTE — Telephone Encounter (Signed)
Dr. Debara Pickett would be fine with him taking Eliquis.  Scr 1.11, weight 93 kg, age 82.  Dose will be 5 mg twice daily

## 2016-01-24 NOTE — Telephone Encounter (Signed)
Spoke with patient's wife. Informed her that eliquis 58m BID is recommended This Rx will need to be printed and [facility] Fax: [phone]: [phone]: DButch Penny

## 2016-01-24 NOTE — Telephone Encounter (Signed)
Message routed to clinical pharmacist for NOAC med questions/med change

## 2016-01-24 NOTE — Telephone Encounter (Signed)
Pt says he would prefer Eliquis,he have used it before.If Dr Debara Pickett thinks Xarelto is better,he will do whatever he says.

## 2016-01-26 IMAGING — CT CT CHEST W/O CM
2 of 3 series · 15 of 36 positions shown, 18 images · non-contrast
Comparison: Chest radiograph 05/10/2013, CT chest 04/18/2004

CLINICAL DATA: Abnormal chest x-ray with questionable density in
the right mid lung

EXAM:
CT CHEST WITHOUT CONTRAST
TECHNIQUE: Multidetector CT imaging of the chest was performed following the
standard protocol without IV contrast. Sagittal and coronal MPR
images reconstructed from axial data set.

[Series 2: chestroutine 5.0 b40f · axial · 0.74mm/px · z∈[-278,+7]mm · 12 of 67 slices shown, 15 images]
[im 5/67  mediastinal]
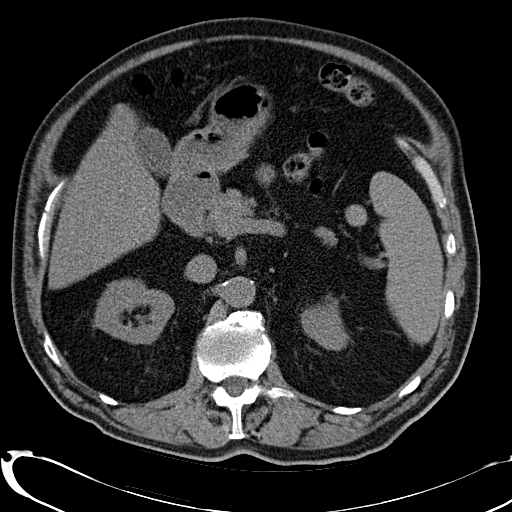
[im 5/67  lung]
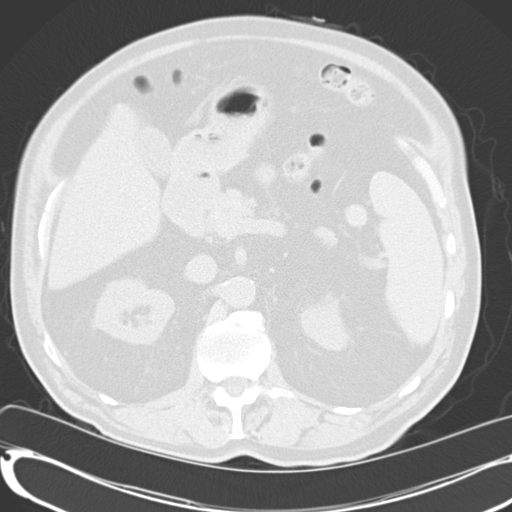
[im 10/67  lung]
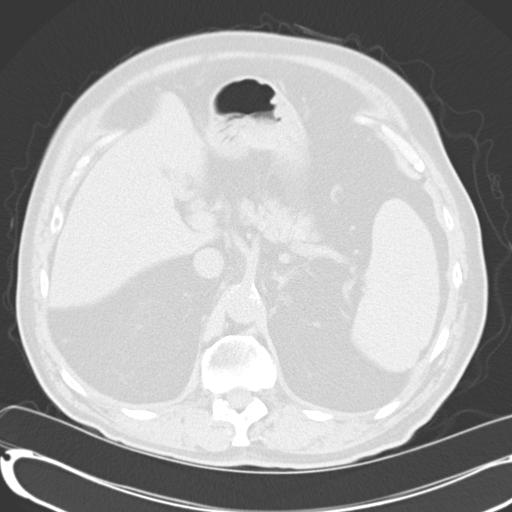
[im 15/67  lung]
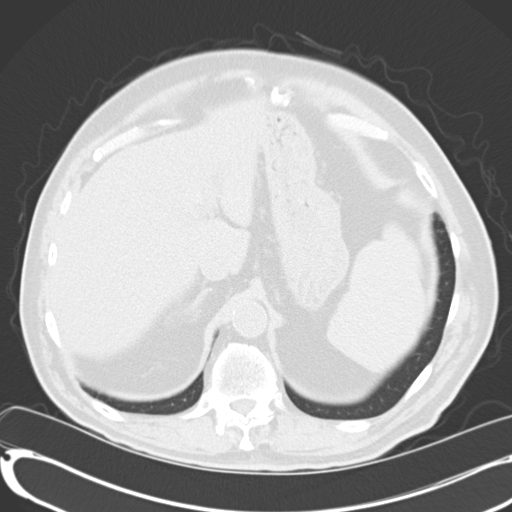
[im 20/67  lung]
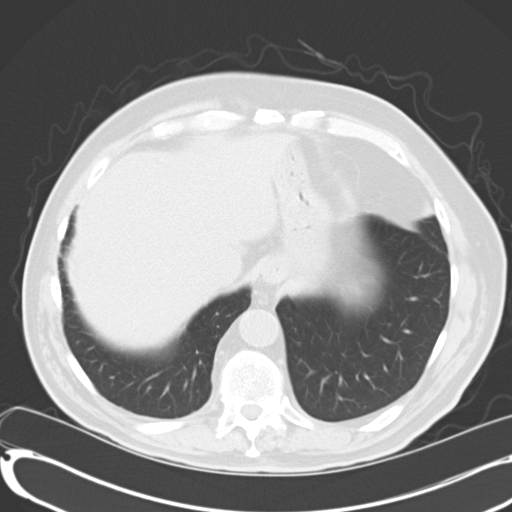
[im 25/67  mediastinal]
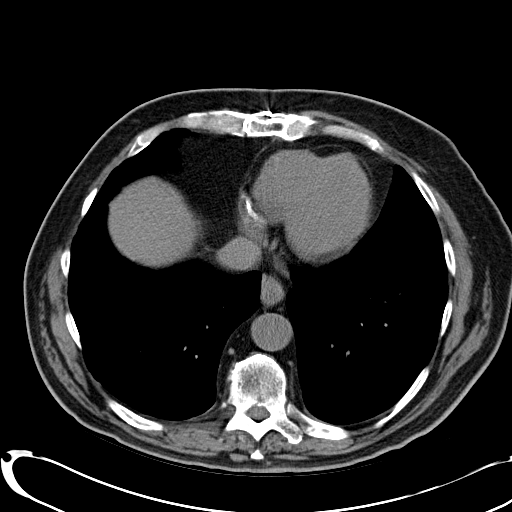
[im 25/67  lung]
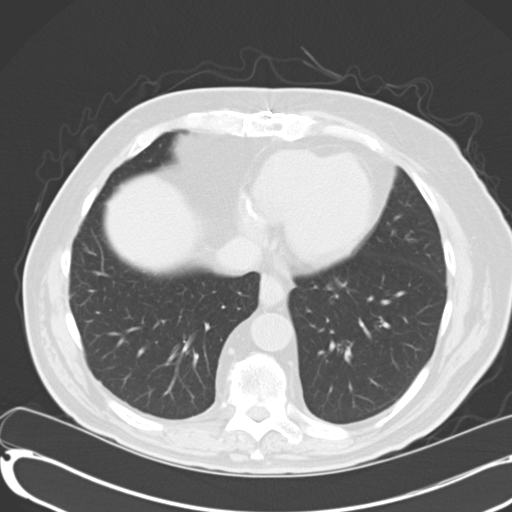
[im 30/67  lung]
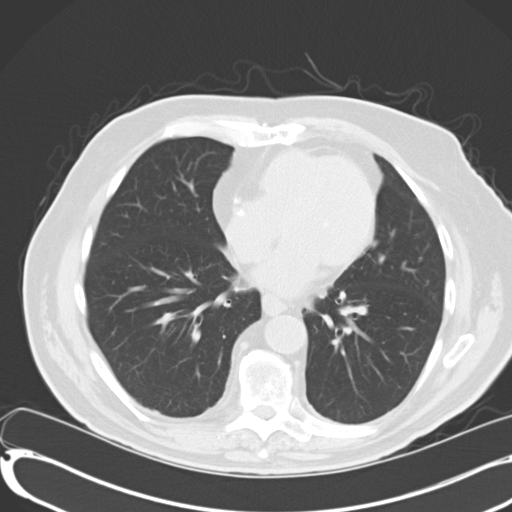
[im 37/67  lung]
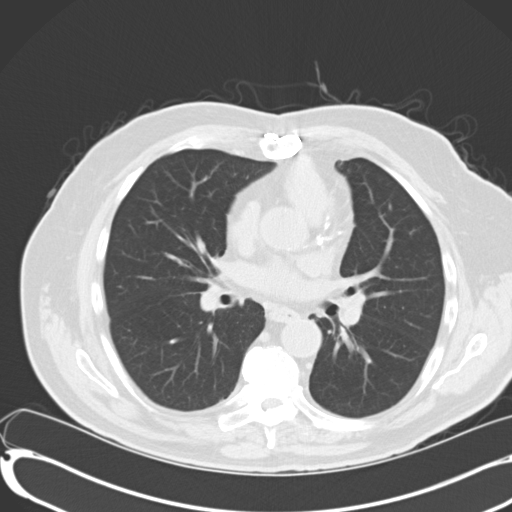
[im 42/67  lung]
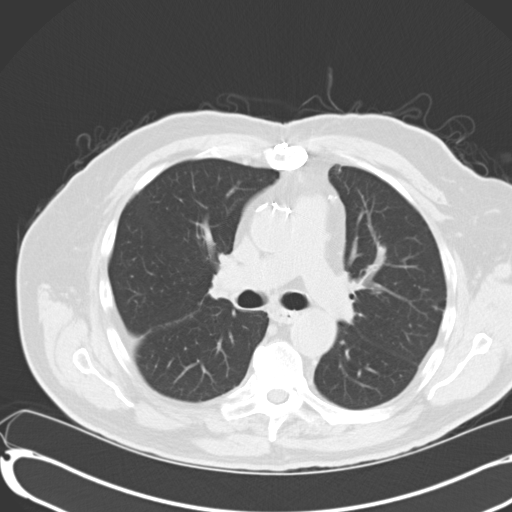
[im 47/67  mediastinal]
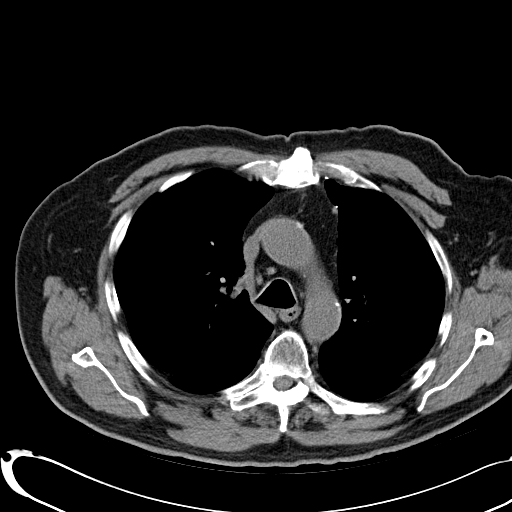
[im 47/67  lung]
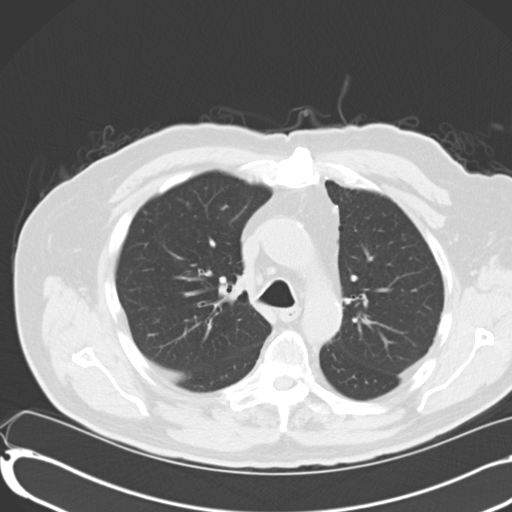
[im 52/67  lung]
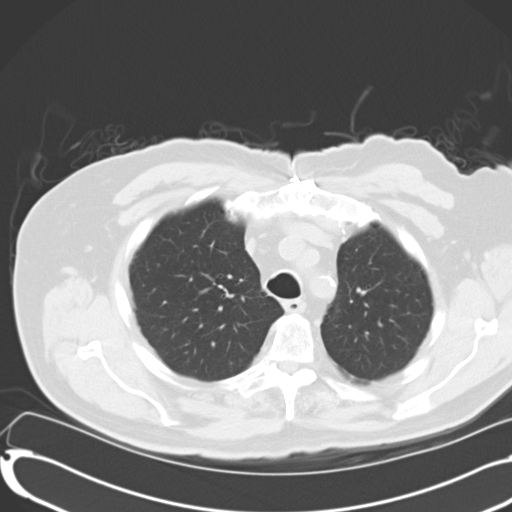
[im 57/67  lung]
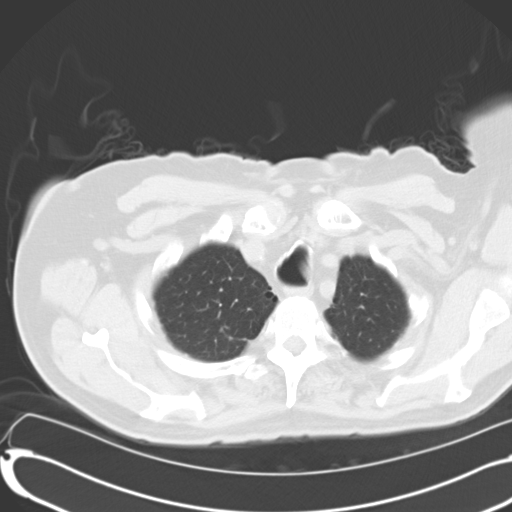
[im 62/67  lung]
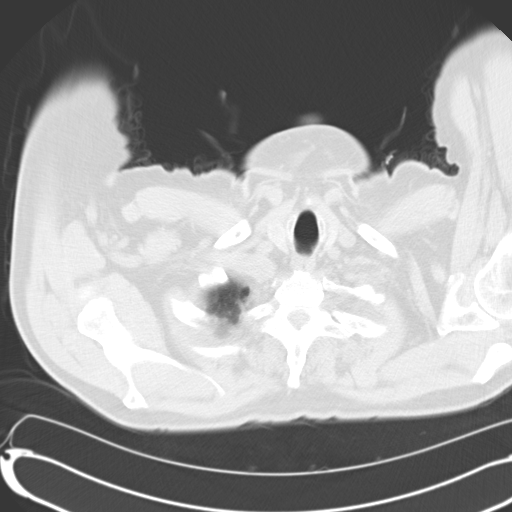

[Series 4: mpr coro 3mm · coronal · 0.65mm/px · 3 of 104 slices shown]
[im 21/104  lung]
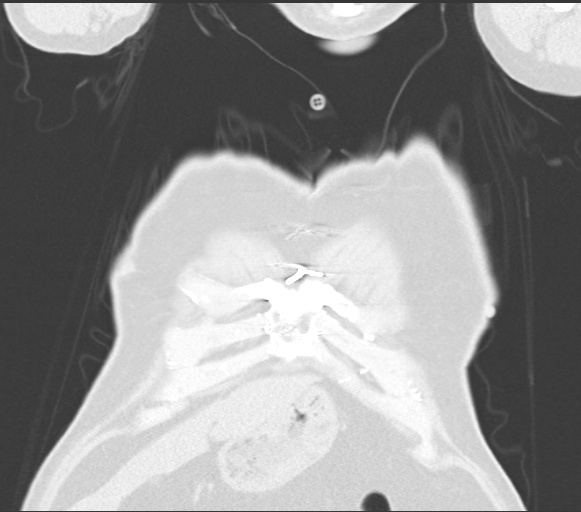
[im 42/104  lung]
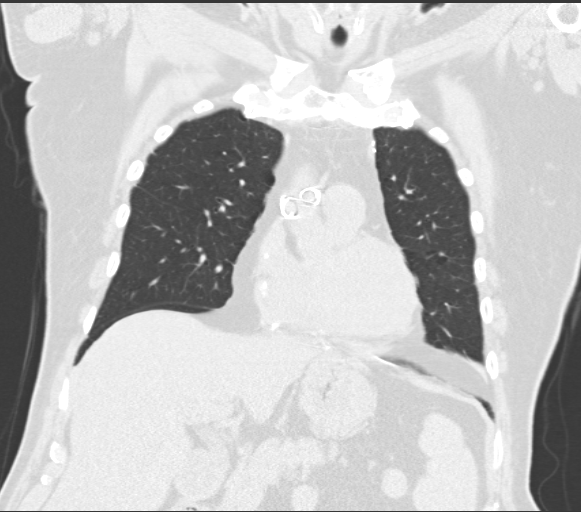
[im 62/104  lung]
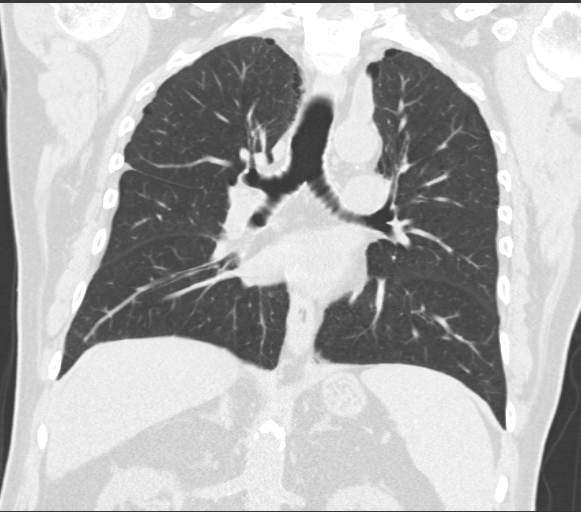

[15 of 36 positions shown; findings below may reference images not displayed]

FINDINGS: Scattered atherosclerotic calcifications aorta and coronary
arteries.

Post median sternotomy and CABG.

Small low-attenuation nodule in the spleen, 13 mm diameter,
corresponding to a hypervascular focus on prior abdominal CT,
question small hemangioma.

Visualized portion of upper abdomen otherwise unremarkable.

No thoracic adenopathy.

Minimal right apical scarring.

Lungs otherwise clear.

No infiltrate, pleural effusion or pneumothorax.

No acute osseous findings.
IMPRESSION: No acute intra thoracic abnormalities.

## 2016-01-30 NOTE — Telephone Encounter (Signed)
Rx faxed by Urban Gibson, [place] on 01/25/16

## 2016-02-08 ENCOUNTER — Other Ambulatory Visit: Payer: Self-pay | Admitting: Internal Medicine

## 2016-02-08 DIAGNOSIS — I739 Peripheral vascular disease, unspecified: Secondary | ICD-10-CM

## 2016-02-11 ENCOUNTER — Telehealth: Payer: Self-pay | Admitting: Internal Medicine

## 2016-02-11 NOTE — Telephone Encounter (Signed)
°  New Prob   Pt has some questions regarding his Eliquis medication. Please call.

## 2016-02-11 NOTE — Telephone Encounter (Signed)
Returned call. This is in regards to starting Eliquis (new Rx). Had previously been on Savaysa. Questions addressed regarding switch. He's got Rx and needs nothing further at this time, but is aware to call if he has concerns or experiences side effects w Eliquis. Pt voiced thanks for call.

## 2016-02-15 ENCOUNTER — Inpatient Hospital Stay (HOSPITAL_COMMUNITY): Admission: RE | Admit: 2016-02-15 | Payer: Medicare Other | Source: Ambulatory Visit

## 2016-02-20 ENCOUNTER — Telehealth: Payer: Self-pay | Admitting: Gastroenterology

## 2016-02-20 DIAGNOSIS — K769 Liver disease, unspecified: Secondary | ICD-10-CM

## 2016-02-20 NOTE — Telephone Encounter (Signed)
Records from [place] reviewed, labs dated 11/2015  BUN 21, Cre 1.11, tbili1, AP 94, AST 37, ALT 42, alb 4, WBC 5.49, Hgb 12.4, platelet 133,000  Patient is due for labs 05/2016: LFT, PT/INR, Met-7, CBC Overdue for abd u/s per pt request: plan for abd u/s 05/2016 OV with RMR only 05/2016

## 2016-02-27 NOTE — Telephone Encounter (Signed)
Lab orders on file. Please nic ov and U/S in 05/2016

## 2016-02-27 NOTE — Telephone Encounter (Signed)
ON RECALL  °

## 2016-02-27 NOTE — Addendum Note (Signed)
Addended by: Claudina Lick on: 02/27/2016 11:19 AM   Modules accepted: Orders

## 2016-02-28 ENCOUNTER — Other Ambulatory Visit: Payer: Self-pay | Admitting: Internal Medicine

## 2016-02-28 NOTE — Telephone Encounter (Signed)
Rx request sent to pharmacy.  

## 2016-03-06 DIAGNOSIS — E119 Type 2 diabetes mellitus without complications: Secondary | ICD-10-CM | POA: Diagnosis not present

## 2016-03-06 DIAGNOSIS — G8929 Other chronic pain: Secondary | ICD-10-CM | POA: Diagnosis not present

## 2016-03-06 DIAGNOSIS — M545 Low back pain: Secondary | ICD-10-CM | POA: Diagnosis not present

## 2016-03-06 DIAGNOSIS — Z6830 Body mass index (BMI) 30.0-30.9, adult: Secondary | ICD-10-CM | POA: Diagnosis not present

## 2016-03-10 ENCOUNTER — Telehealth: Payer: Self-pay | Admitting: Internal Medicine

## 2016-03-10 NOTE — Telephone Encounter (Signed)
Patient called in and stated he is taking his Protonix, however it's not helping his belching.  Please advise on something else he could take.  Routing to [employer]

## 2016-03-10 NOTE — Telephone Encounter (Signed)
Pt wants to speak with LSL or have LSL call him. He said that he needed something called into [facility] in [place]. He is having a lot of belching and gas. I told him that LSL was seeing patients but would get the message to the nurse.

## 2016-03-10 NOTE — Telephone Encounter (Signed)
I tried to call pt to see if he was still taking his reflux medication or if there was something else he needed. Spoke with his wife, pt was not at home and she had no idea why he was calling. She will have him return call.

## 2016-03-11 MED ORDER — RABEPRAZOLE SODIUM 20 MG PO TBEC: 20.0000 mg | DELAYED_RELEASE_TABLET | Freq: Every day | ORAL | 1 refills | Status: DC

## 2016-03-11 NOTE — Addendum Note (Signed)
Addended by: Mahala Menghini on: 03/11/2016 10:01 AM   Modules accepted: Orders

## 2016-03-11 NOTE — Telephone Encounter (Signed)
Routing to LSL 

## 2016-03-11 NOTE — Telephone Encounter (Signed)
We discussed at his last ov in 10/2015. Extensive evaluation for these symptoms. Discuss possible breath test for bacterial overgrowth vs empiric treatment with course of antibiotics. He declined at that time.   We can try another PPI although I'm not sure that it will help the symptoms he complains of. rx for aciphex sent in. He needs to stop pantoprazole once he gets aciphex.  He needs ov with RMR first available (reason for belching/cirrhosis/due labs and u/s)

## 2016-03-13 ENCOUNTER — Ambulatory Visit (HOSPITAL_COMMUNITY)
Admission: RE | Admit: 2016-03-13 | Discharge: 2016-03-13 | Disposition: A | Discharge status: Home / Self Care | Payer: Medicare Other | Source: Ambulatory Visit | Attending: Cardiology | Admitting: Cardiology

## 2016-03-13 ENCOUNTER — Encounter: Payer: Self-pay | Admitting: Internal Medicine

## 2016-03-13 DIAGNOSIS — I2510 Atherosclerotic heart disease of native coronary artery without angina pectoris: Secondary | ICD-10-CM | POA: Insufficient documentation

## 2016-03-13 DIAGNOSIS — I6523 Occlusion and stenosis of bilateral carotid arteries: Secondary | ICD-10-CM | POA: Insufficient documentation

## 2016-03-13 DIAGNOSIS — I739 Peripheral vascular disease, unspecified: Secondary | ICD-10-CM | POA: Insufficient documentation

## 2016-03-13 DIAGNOSIS — Z87891 Personal history of nicotine dependence: Secondary | ICD-10-CM | POA: Diagnosis not present

## 2016-03-13 DIAGNOSIS — Z951 Presence of aortocoronary bypass graft: Secondary | ICD-10-CM | POA: Insufficient documentation

## 2016-03-13 DIAGNOSIS — R0989 Other specified symptoms and signs involving the circulatory and respiratory systems: Secondary | ICD-10-CM

## 2016-03-13 DIAGNOSIS — I10 Essential (primary) hypertension: Secondary | ICD-10-CM | POA: Insufficient documentation

## 2016-03-13 NOTE — Telephone Encounter (Signed)
APPT MADE AND LETTER SENT  °

## 2016-03-13 NOTE — Telephone Encounter (Signed)
Tried to call pt- NA-LMOM with recommendations. Please schedule ov with RMR.

## 2016-03-25 ENCOUNTER — Ambulatory Visit (INDEPENDENT_AMBULATORY_CARE_PROVIDER_SITE_OTHER): Payer: Medicare Other | Admitting: Internal Medicine

## 2016-03-25 ENCOUNTER — Encounter: Payer: Self-pay | Admitting: Internal Medicine

## 2016-03-25 VITALS — BP 161/76 | HR 62 | Temp 97.6°F | Ht 69.0 in | Wt 196.0 lb

## 2016-03-25 DIAGNOSIS — R142 Eructation: Secondary | ICD-10-CM | POA: Diagnosis not present

## 2016-03-25 DIAGNOSIS — K7581 Nonalcoholic steatohepatitis (NASH): Secondary | ICD-10-CM | POA: Diagnosis not present

## 2016-03-25 DIAGNOSIS — K219 Gastro-esophageal reflux disease without esophagitis: Secondary | ICD-10-CM

## 2016-03-25 NOTE — Progress Notes (Signed)
Primary Care Physician:  Glo Herring., MD Primary Gastroenterologist:  Dr. Gala Romney  Pre-Procedure History & Physical: HPI:  [name] is a 83 y.o. male here for follow-up bloating and flatulence. States patient had the symptoms are nonprogressive but persistent for 2 or 3 years. Gallbladder negative; EGD negative as for a cause. He's never had diarrhea;  somewhat of a tendency toward constipation. He notes he has been on a probiotic for the past couple years and wonder if this has anything to do with his gas symptoms. Symptoms have not improved with a variety of over-the-counter anti-gas preparations. Gets belching and flatulence before he finishes a meal. Has absolutely no early satiety nausea or vomiting. Protonix recently some switched  to rabeprazole. Made no difference in symptoms. Completed hepatitis A B vaccination.  Past Medical History:  Diagnosis Date  . BPH (benign prostatic hyperplasia)   . CAD (coronary artery disease) of artery bypass graft 2006  . Fatty liver   . GERD (gastroesophageal reflux disease)   . Gilbert's syndrome   . Hiatal hernia   . History of nuclear stress test 11/2007   bruce myoview; normal pattern of persuion in all regions; post-stress EF 70%; low risk   . HTN (hypertension)   . Neuropathy ([place])   . PAF (paroxysmal atrial fibrillation) ([place])   . PVD (peripheral vascular disease) ([place])    0-49% R & L ICA stenosis (2013)   . Renal artery aneurysm ([place])   . S/P CABG (coronary artery bypass graft) 2006  . Schatzki's ring    non critical  . Thrombocytopenia ([place])     Past Surgical History:  Procedure Laterality Date  . CATARACT EXTRACTION  2011  . COLONOSCOPY  2006   Dr. Margarito Courser pancolonic diverticula  . COLONOSCOPY  01/07/2012   Dr. Abbe Amsterdam normal rectum, pancolonic diverticulosis, hyperplastic polyp  . COLONOSCOPY N/A 03/20/2015   [id number] diverticulosis  . CORONARY ARTERY BYPASS GRAFT  04/2004   LIMA to diagonal,  SVG to ramus intermedius, SVG to PDA; placed stent for crossed coronary arteries (Dr. Prescott Gum)  . ESOPHAGOGASTRODUODENOSCOPY  2006   Dr. Ruthell Rummage Schatzki's ring, s/p 58-F Maloney dilation  . ESOPHAGOGASTRODUODENOSCOPY  01/07/2012   Dr. Gareth Morgan ring, hiatal hernia, fundic gland polyp  . ESOPHAGOGASTRODUODENOSCOPY N/A 03/20/2015   TSV:XBLTJ 1 varices/HH Gastric polpy s/p bx  . rotater cuff     repair  . TONSILLECTOMY      Prior to Admission medications   Medication Sig Start Date End Date Taking? Authorizing Provider  allopurinol (ZYLOPRIM) 300 MG tablet Take 300 mg by mouth 4 (four) times daily as needed (for gout flare up).   Yes Historical Provider, MD  ALPRAZolam Duanne Moron) 0.5 MG tablet Take 0.5 mg by mouth at bedtime as needed. Sleep.   Yes Historical Provider, MD  amLODipine (NORVASC) 5 MG tablet Take 1 tablet (5 mg total) by mouth 2 (two) times daily. Patient taking differently: Take 10 mg by mouth daily.  10/07/13  Yes Pixie Casino, MD  apixaban (ELIQUIS) 5 MG TABS tablet Take 1 tablet (5 mg total) by mouth 2 (two) times daily. 01/24/16  Yes Pixie Casino, MD  finasteride (PROSCAR) 5 MG tablet Take 5 mg by mouth daily.   Yes Historical Provider, MD  flunisolide (NASALIDE) 0.025 % SOLN Inhale 2 sprays into the lungs daily.    Yes Historical Provider, MD  gabapentin (NEURONTIN) 300 MG capsule Take 300 mg by mouth 3 (three) times daily.  Yes Historical Provider, MD  hydrochlorothiazide (HYDRODIURIL) 25 MG tablet TAKE ONE-HALF TABLET BY MOUTH ONCE DAILY Patient taking differently: TAKE ONE-HALF TABLET BY MOUTH ONCE DAILY. Takes as needed 02/28/16  Yes Pixie Casino, MD  levothyroxine (SYNTHROID, LEVOTHROID) 50 MCG tablet Take by mouth daily before breakfast. Pt takes 149mg   Yes Historical Provider, MD  metoprolol tartrate (LOPRESSOR) 25 MG tablet Take 0.5 tablets (12.5 mg total) by mouth 2 (two) times daily. <PLEASE MAKE APPOINTMENT FOR REFILLS> 07/26/15  Yes  KPixie Casino MD  pantoprazole (PROTONIX) 40 MG tablet Take 40 mg by mouth daily.   Yes Historical Provider, MD  Probiotic Product (PROBIOTIC DAILY PO) Take by mouth daily. [place]   Yes Historical Provider, MD  tamsulosin (FLOMAX) 0.4 MG CAPS capsule Take 0.4 mg by mouth daily.   Yes Historical Provider, MD  doxazosin (CARDURA) 8 MG tablet Take 4 mg by mouth daily.    Historical Provider, MD  edoxaban (SAVAYSA) 60 MG TABS tablet Take 60 mg by mouth daily. Patient not taking: Reported on 03/25/2016 01/18/16   KPixie Casino MD  Influenza vac split quadrivalent PF (FLUARIX) 0.5 ML injection Inject 0.5 mLs into the muscle once.    Historical Provider, MD  pneumococcal 23 valent vaccine (PNU-IMMUNE) 25 MCG/0.5ML injection Inject 0.5 mLs into the muscle once.    Historical Provider, MD  Probiotic Product (ALIGN) 4 MG CAPS Take 4 mg by mouth daily. Patient not taking: Reported on 03/25/2016 10/03/14   LMahala Menghini PA-C  RABEprazole (ACIPHEX) 20 MG tablet Take 1 tablet (20 mg total) by mouth daily before breakfast. Patient not taking: Reported on 03/25/2016 03/11/16   LMahala Menghini PA-C    Allergies as of 03/25/2016 - Review Complete 03/25/2016  Allergen Reaction Noted  . Antihistamines, chlorpheniramine-type Rash 08/26/2011    Family History  Problem Relation Age of Onset  . Heart attack Mother 63   deceased  . Heart attack Father 821   deceased  . Heart disease Brother     x2  . Diabetes Brother     x3  . Colon cancer Neg Hx     Social History   Social History  . Marital status: Married    Spouse name: N/A  . Number of children: N/A  . Years of education: N/A   Occupational History  . Not on file.   Social History Main Topics  . Smoking status: Former Smoker    Packs/day: 1.00    Years: 40.00    Types: Cigarettes    Quit date: 04/07/2004  . Smokeless tobacco: Never Used  . Alcohol use Yes     Comment: occassional drinker  . Drug use: No  . Sexual  activity: No   Other Topics Concern  . Not on file   Social History Narrative  . No narrative on file    Review of Systems: See HPI, otherwise negative ROS  Physical Exam: BP (!) 161/76   Pulse 62   Temp 97.6 F (36.4 C) (Oral)   Ht 5' 9"  (1.753 m)   Wt 196 lb (88.9 kg)   BMI 28.94 kg/m  General:   Alert,  Well-developed, well-nourished, pleasant and cooperative in NAD Skin:  Intact without significant lesions or rashes. Abdomen: Non-distended, normal bowel sounds.  Soft and nontender without appreciable mass or hepatosplenomegaly. No shifting dullness or fluid wave. Pulses:  Normal pulses noted. Extremities:  Without clubbing or edema.  Impression:    Pleasant  83 year old well compensated Nash/cirrhosis. Grade 1 esophageal varices. Flatulence and belching for years now. Flatulence and belching before he finishes a meal.  He may have an an element of aerophagia. I doubt bacterial overgrowth. He and his wife ask about probiotic therapy being a contributor. That is a very good question; symptoms roughly correlate with the use of  probiotic therapy.  He is not on a nonselective beta blocker but varices were minimal. Would advocate a repeat EGD in a couple years from now.  Recommendations:   Stop probiotic x 2 weeks  Let me know how you are doing   Continue Rabeprazole (Aciphex) 20 mg daily  OV 3 mos in 3 mos    Notice: This dictation was prepared with Dragon dictation along with smaller phrase technology. Any transcriptional errors that result from this process are unintentional and may not be corrected upon review.

## 2016-03-25 NOTE — Patient Instructions (Addendum)
Stop probiotic x 2 weeks  Let me know how you are doing   Continue Rabeprazole (Aciphex) 20 mg daily  OV 3 mos in 3 mos

## 2016-03-28 ENCOUNTER — Telehealth: Payer: Self-pay | Admitting: Internal Medicine

## 2016-03-28 NOTE — Telephone Encounter (Signed)
New message      Pt c/o medication issue:  1. Name of Medication:  eliquis 2. How are you currently taking this medication (dosage and times per day)?  71m daily 3. Are you having a reaction (difficulty breathing--STAT)?  no 4. What is your medication issue? Pt states that his stool is dark.  Please advise

## 2016-03-28 NOTE — Telephone Encounter (Signed)
Returned call to patient. He states he has been having dark stools. He states he ate blueberries yesterday and today. No bleeding issues.   Asked if he felt fatigue, tired - he states he has felt weak. He states his BP is low.  -- patient is not taking doxazosin -- patient is NOT taking amlodipine d/t low BP (maybe he took one tab this week) -- has not taken hctz since last week -- has not taken metoprolol much d/t low BP He states his BP is running 130-140/50s - states this is low He states his HR gets in the 40s-50s He states he feels the best when his SBP is around 145-150 He sates he has had fatigue, low BP, weakness on and off for a year  Patient states he saw GI doctor last week - was advised to hold probiotics The concerns today was not an issue at his last appt w/GI He states if he doesn't get better in a week or 2, then he would give him an abx for his intestines Was going to advise patient to contact GI office however patient states his GI office is closed on Fridays  Will defer to DOD & clinical pharmacy staff for advice - labs?, urgent care eval?

## 2016-03-28 NOTE — Telephone Encounter (Signed)
Dr Martinique recommendation noted.  No additional recommendations from pharmacist.

## 2016-03-28 NOTE — Telephone Encounter (Signed)
Returned call to patient to advise on recommendations to have blood work - CBC. He states his Hgb is always low and that he had lab work done at the [place] about 1.5 weeks ago. He has those labs with him. Informed him that we do not have access to the [place] lab work. Last CBC in epic from 11/2015. Patient does NOT want to have blood work done. Advised patient continue to monitor his stools and for s/sx of bleeding and seek eval at ED/urgent care if issue persists/worsens.   In regards to BP: Patient states he checked his BP again: -- 140s-150s/60s   - he states he does not need to take his BP medications if his readings are in this low.   - explained that this is not "low". -- appears per conversation that patient takes BP meds as he feels he needs them  Informed him that the message would be sent to Dr. Debara Pickett to advise on further. He agreed w/plan

## 2016-03-28 NOTE — Telephone Encounter (Signed)
Not clear if dark stools indicative of bleeding or not. Would recommend checking CBC. If Hgb stable would observe and follow up with GI next week.  Peter Martinique MD, [facility]

## 2016-03-29 NOTE — Telephone Encounter (Signed)
Thanks for letting me know.  Dr. Lemmie Evens

## 2016-03-31 NOTE — Telephone Encounter (Signed)
Called patient to follow up. He is doing fine - no bleeding issues, no new complaints. No further action necessary

## 2016-04-08 ENCOUNTER — Encounter: Payer: Self-pay | Admitting: Internal Medicine

## 2016-04-08 ENCOUNTER — Telehealth: Payer: Self-pay | Admitting: Internal Medicine

## 2016-04-08 NOTE — Telephone Encounter (Signed)
Letter mailed

## 2016-04-08 NOTE — Telephone Encounter (Signed)
DUE FOR ULTRASOUND AND OFFICE VISIT

## 2016-04-09 ENCOUNTER — Telehealth: Payer: Self-pay

## 2016-04-09 NOTE — Telephone Encounter (Signed)
Received a fax from the [place]- the pt is wanting to get his rabeprazole from the [place]. They need last ov note faxed to them. I called the pt and got permission from him to send ov note.   Last ov note has been faxed to the [place].

## 2016-04-24 NOTE — Telephone Encounter (Signed)
Pt called this morning to say that the [place] did not get the information that they needed. I told him that I would refax the information to [phone].

## 2016-04-28 ENCOUNTER — Other Ambulatory Visit: Payer: Self-pay

## 2016-04-28 DIAGNOSIS — K769 Liver disease, unspecified: Secondary | ICD-10-CM

## 2016-04-28 NOTE — Telephone Encounter (Signed)
Pt called again this morning and said the [place] still doesn't have his ov note. I have faxed it again [phone]) and I called Alberteen Spindle RN at the [place]  ([phone] left a voicemail, explained that I have faxed this information to them 4 times and if they didn't get it or if they need anything else to call me and let me know.

## 2016-04-28 NOTE — Telephone Encounter (Signed)
I spoke with [name], she said she has gotten the records and she has been trying to call the patient and has been unable to reach him. She said she will continue to try to call him.

## 2016-04-29 DIAGNOSIS — J40 Bronchitis, not specified as acute or chronic: Secondary | ICD-10-CM | POA: Diagnosis not present

## 2016-04-29 DIAGNOSIS — M5432 Sciatica, left side: Secondary | ICD-10-CM | POA: Diagnosis not present

## 2016-05-06 DIAGNOSIS — Z6828 Body mass index (BMI) 28.0-28.9, adult: Secondary | ICD-10-CM | POA: Diagnosis not present

## 2016-05-06 DIAGNOSIS — E119 Type 2 diabetes mellitus without complications: Secondary | ICD-10-CM | POA: Diagnosis not present

## 2016-05-06 DIAGNOSIS — F419 Anxiety disorder, unspecified: Secondary | ICD-10-CM | POA: Diagnosis not present

## 2016-05-06 DIAGNOSIS — K219 Gastro-esophageal reflux disease without esophagitis: Secondary | ICD-10-CM | POA: Diagnosis not present

## 2016-05-08 ENCOUNTER — Encounter: Payer: Self-pay | Admitting: Internal Medicine

## 2016-06-03 DIAGNOSIS — K769 Liver disease, unspecified: Secondary | ICD-10-CM | POA: Diagnosis not present

## 2016-06-03 LAB — HEPATIC FUNCTION PANEL
ALBUMIN: 3.6 g/dL (ref 3.6–5.1)
ALT: 28 U/L (ref 9–46)
AST: 35 U/L (ref 10–35)
Alkaline Phosphatase: 95 U/L (ref 40–115)
Bilirubin, Direct: 0.3 mg/dL — ABNORMAL HIGH (ref <=0.2)
Indirect Bilirubin: 0.7 mg/dL (ref 0.2–1.2)
TOTAL PROTEIN: 6 g/dL — AB (ref 6.1–8.1)
Total Bilirubin: 1 mg/dL (ref 0.2–1.2)

## 2016-06-03 LAB — CBC WITH DIFFERENTIAL/PLATELET
BASOS ABS: 35 {cells}/uL (ref 0–200)
Basophils Relative: 1 %
EOS PCT: 3 %
Eosinophils Absolute: 105 cells/uL (ref 15–500)
HCT: 35.1 % — ABNORMAL LOW (ref 38.5–50.0)
HEMOGLOBIN: 12.1 g/dL — AB (ref 13.2–17.1)
LYMPHS ABS: 1015 {cells}/uL (ref 850–3900)
Lymphocytes Relative: 29 %
MCH: 33.6 pg — ABNORMAL HIGH (ref 27.0–33.0)
MCHC: 34.5 g/dL (ref 32.0–36.0)
MCV: 97.5 fL (ref 80.0–100.0)
MONOS PCT: 12 %
MPV: 11.1 fL (ref 7.5–12.5)
Monocytes Absolute: 420 cells/uL (ref 200–950)
NEUTROS ABS: 1925 {cells}/uL (ref 1500–7800)
NEUTROS PCT: 55 %
PLATELETS: 139 10*3/uL — AB (ref 140–400)
RBC: 3.6 MIL/uL — AB (ref 4.20–5.80)
RDW: 12.9 % (ref 11.0–15.0)
WBC: 3.5 10*3/uL — AB (ref 3.8–10.8)

## 2016-06-03 LAB — BASIC METABOLIC PANEL
BUN: 10 mg/dL (ref 7–25)
CALCIUM: 8.4 mg/dL — AB (ref 8.6–10.3)
CO2: 25 mmol/L (ref 20–31)
CREATININE: 1.02 mg/dL (ref 0.70–1.11)
Chloride: 107 mmol/L (ref 98–110)
Glucose, Bld: 106 mg/dL — ABNORMAL HIGH (ref 65–99)
Potassium: 4 mmol/L (ref 3.5–5.3)
SODIUM: 139 mmol/L (ref 135–146)

## 2016-06-03 LAB — PROTIME-INR
INR: 1
Prothrombin Time: 10.8 s (ref 9.0–11.5)

## 2016-06-04 NOTE — Progress Notes (Signed)
Labs stable. MELD 6.  He is due for ruq u/s now for hepatoma screening. CBC, PT/INR, CMET in 6 months.

## 2016-06-05 ENCOUNTER — Other Ambulatory Visit: Payer: Self-pay | Admitting: Gastroenterology

## 2016-06-05 DIAGNOSIS — K769 Liver disease, unspecified: Secondary | ICD-10-CM

## 2016-06-10 ENCOUNTER — Telehealth: Payer: Self-pay | Admitting: Internal Medicine

## 2016-06-10 NOTE — Telephone Encounter (Signed)
Pt was calling for his lab results. Please call him at [phone]

## 2016-06-11 NOTE — Telephone Encounter (Signed)
Spoke with the pt, gave him his results. He asked for a copy of the labs to be mailed to him. I have mailed him a copy. Encouraged him to schedule U/S, he said he would call back and schedule it.

## 2016-06-22 IMAGING — US US ABDOMEN COMPLETE
1 series · 13 of 25 positions shown · non-contrast
Comparison: Abdominal pelvic CT scan dated September 01, 2011 and
abdominal ultrasound January 23, 2005 there

CLINICAL DATA: Bloating, history of fatty liver disease and
splenomegaly

EXAM:
ULTRASOUND ABDOMEN COMPLETE

[Series 1: us abdomen complete · 0.18mm/px · 13 of 112 slices shown]
[im 1/112]
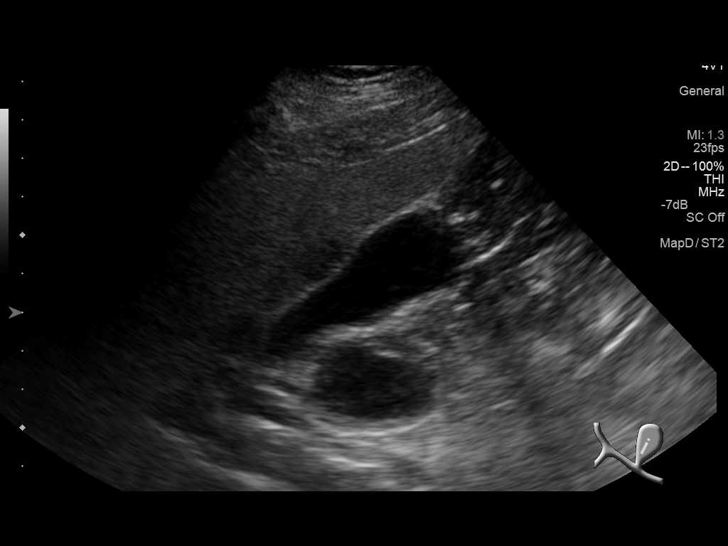
[im 10/112]
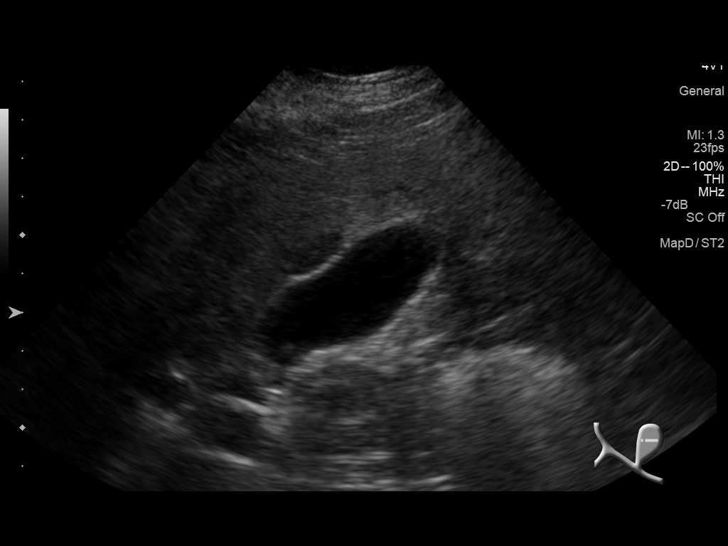
[im 19/112]
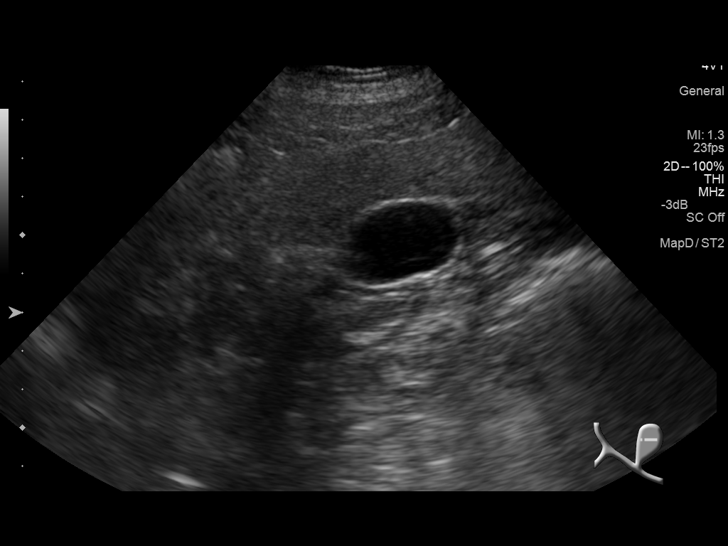
[im 28/112]
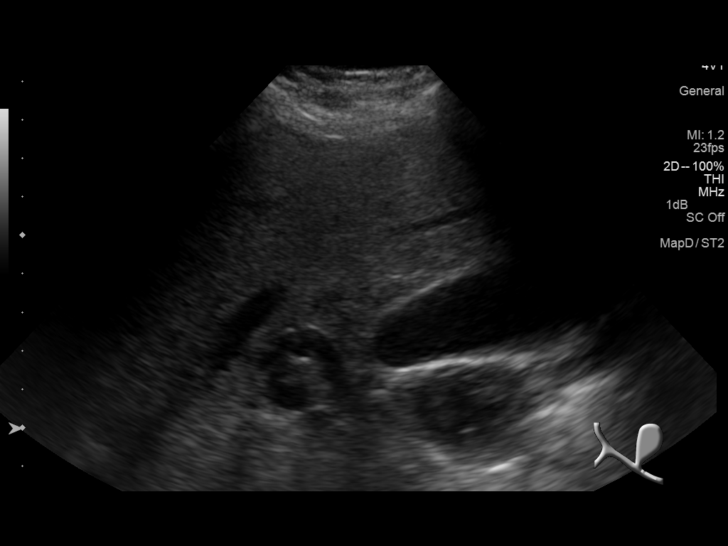
[im 38/112]
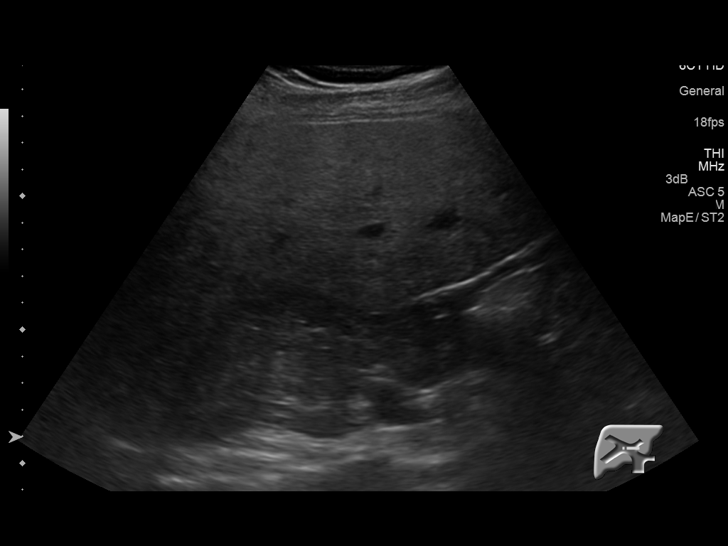
[im 47/112]
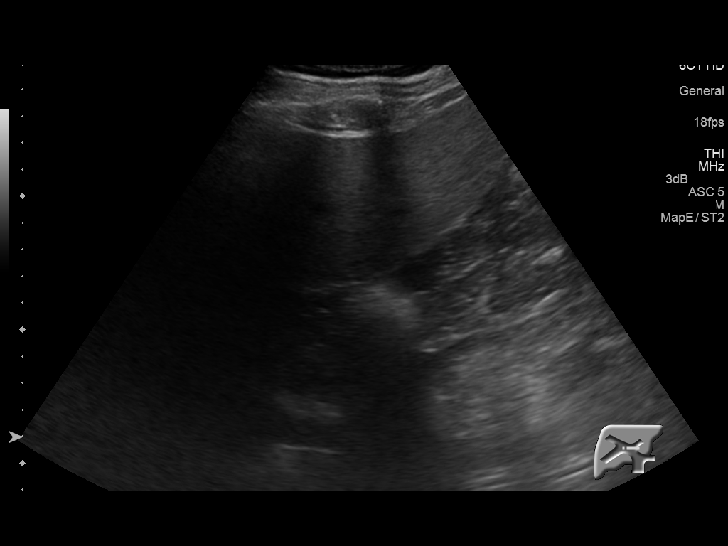
[im 56/112]
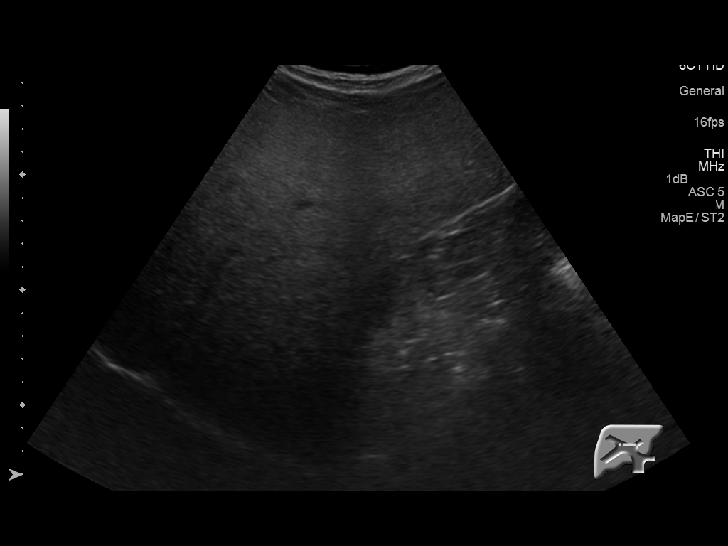
[im 65/112]
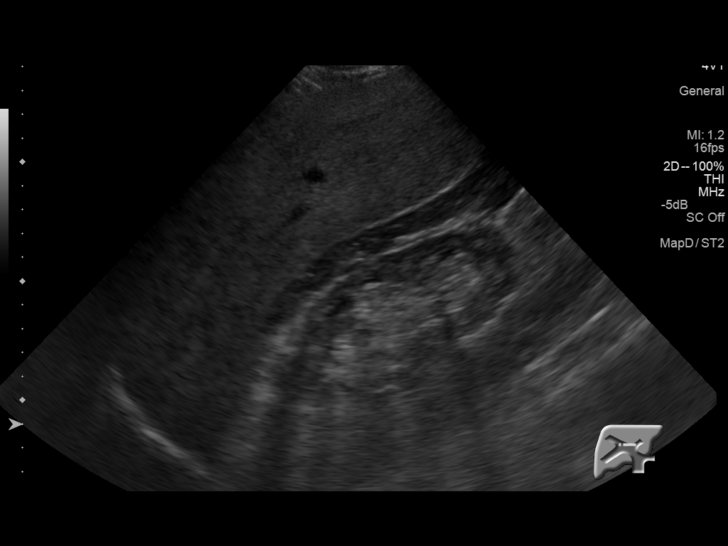
[im 75/112]
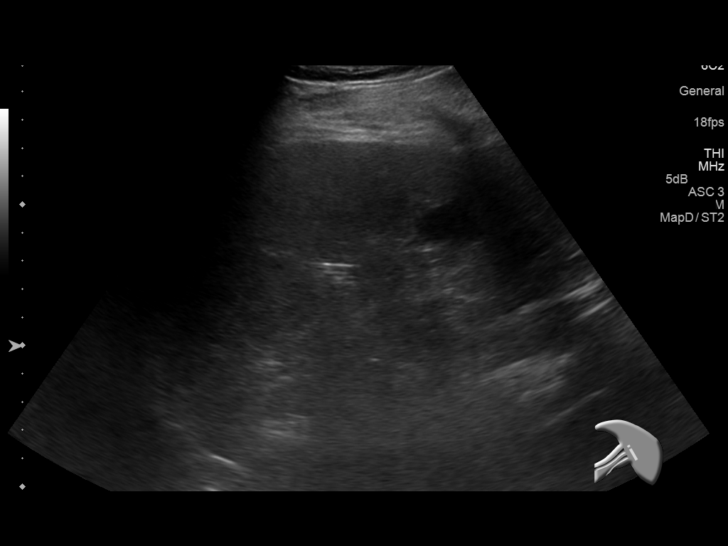
[im 84/112]
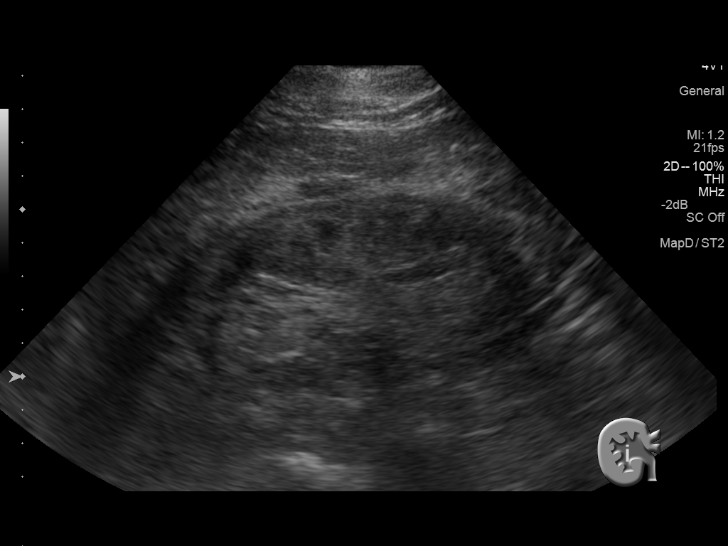
[im 93/112]
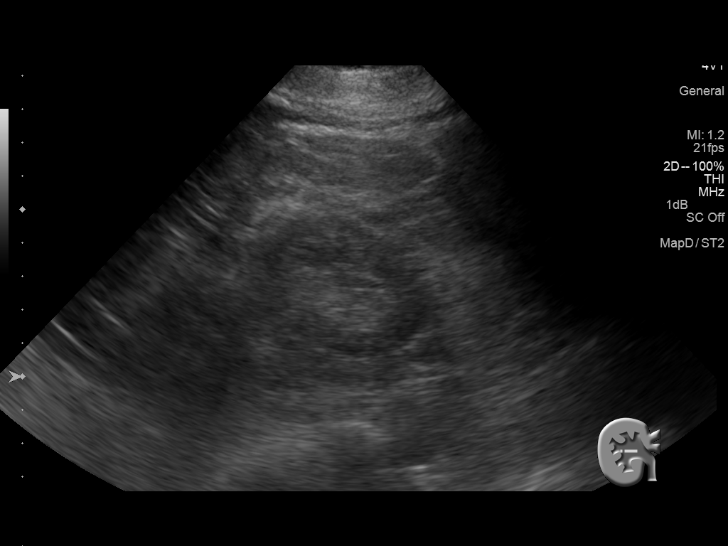
[im 102/112]
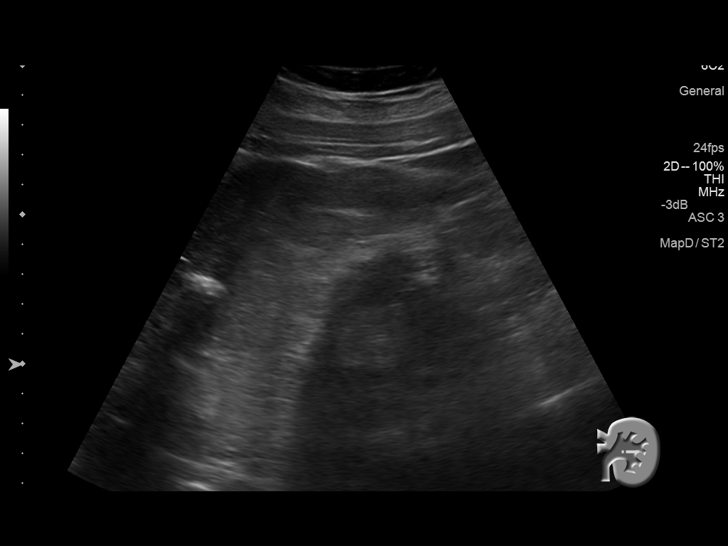
[im 112/112]
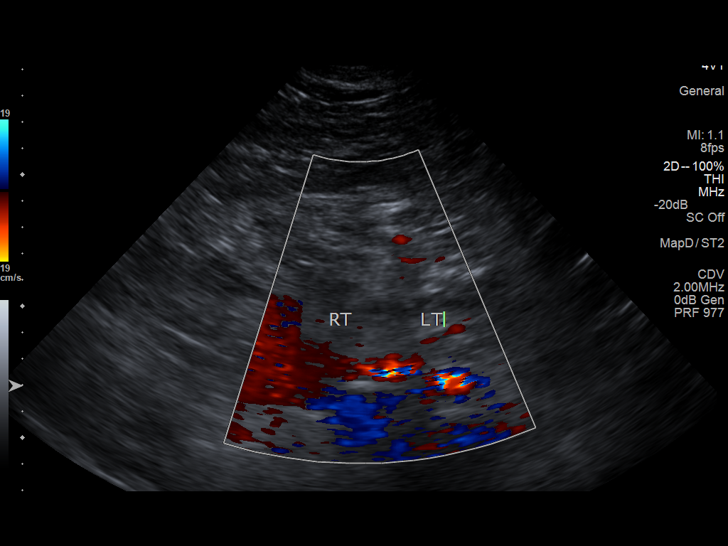

[13 of 25 positions shown; findings below may reference images not displayed]

FINDINGS: Gallbladder: No gallstones or wall thickening visualized. No
sonographic Murphy sign noted.

Common bile duct: Diameter: 6.7 mm

Liver: The hepatic echotexture is mildly increased. There is no
focal mass or ductal dilation.

IVC: No abnormality visualized.

Pancreas: The pancreas largely obscured by bowel gas.

Spleen: The spleen exhibits a simple appearing cyst measuring 1.7 x
1.6 x 2 cm. The spleen is enlarged but was difficult to measure due
to the patient's body habitus. Approximate measurements yield a
splenic volume of 700 cc.

Right Kidney: Length: 10.7 cm. The renal cortical echotexture is
mildly increased remains slightly lower than that of the adjacent
liver.

Left Kidney: Length: 13.6 cm. The echotexture of the cortex is
mildly increased. There is no hydronephrosis.

Abdominal aorta: There is mural plaque without evidence of aneurysm.

Other findings: The study is limited due to bowel gas.
IMPRESSION: 1. Fatty infiltrative change of the liver. There is no hepatic mass
or ductal dilation.
2. The gallbladder is normal in appearance. The pancreas is obscured
by bowel gas.
3. There is splenomegaly.
4. The kidneys exhibit mildly increased cortical echotexture likely
reflecting medical renal disease. There is no hydronephrosis.

## 2016-07-04 DIAGNOSIS — N138 Other obstructive and reflux uropathy: Secondary | ICD-10-CM | POA: Diagnosis not present

## 2016-07-04 DIAGNOSIS — R351 Nocturia: Secondary | ICD-10-CM | POA: Diagnosis not present

## 2016-07-04 DIAGNOSIS — N401 Enlarged prostate with lower urinary tract symptoms: Secondary | ICD-10-CM | POA: Diagnosis not present

## 2016-07-04 DIAGNOSIS — N528 Other male erectile dysfunction: Secondary | ICD-10-CM | POA: Diagnosis not present

## 2016-07-07 ENCOUNTER — Other Ambulatory Visit: Payer: Self-pay | Admitting: Gastroenterology

## 2016-08-15 ENCOUNTER — Telehealth: Payer: Self-pay | Admitting: Internal Medicine

## 2016-08-15 NOTE — Telephone Encounter (Signed)
Patient called in stating the [facility] only wanted to give Protonix and doesn't want to fill the Aciphex.  He stated that his stomach is hurting and would to see RMR.

## 2016-08-15 NOTE — Telephone Encounter (Signed)
I tried to call the patient, no answer, lmom

## 2016-08-15 NOTE — Telephone Encounter (Signed)
Pt is scheduled to see RMR on 7/17 at 10:00 am

## 2016-08-15 NOTE — Telephone Encounter (Signed)
Pt call patient. He has questions about his prescriptions. [phone]

## 2016-08-19 ENCOUNTER — Other Ambulatory Visit: Payer: Self-pay

## 2016-08-19 ENCOUNTER — Ambulatory Visit (INDEPENDENT_AMBULATORY_CARE_PROVIDER_SITE_OTHER): Payer: Medicare Other | Admitting: Internal Medicine

## 2016-08-19 ENCOUNTER — Encounter: Payer: Self-pay | Admitting: Internal Medicine

## 2016-08-19 VITALS — BP 150/63 | HR 65 | Temp 96.9°F | Ht 69.0 in | Wt 199.2 lb

## 2016-08-19 DIAGNOSIS — K7030 Alcoholic cirrhosis of liver without ascites: Secondary | ICD-10-CM | POA: Diagnosis not present

## 2016-08-19 DIAGNOSIS — K219 Gastro-esophageal reflux disease without esophagitis: Secondary | ICD-10-CM

## 2016-08-19 DIAGNOSIS — K769 Liver disease, unspecified: Secondary | ICD-10-CM

## 2016-08-19 NOTE — Progress Notes (Signed)
Primary Care Physician:  Redmond School, MD Primary Gastroenterologist:  Dr. Gala Romney  Pre-Procedure History & Physical: HPI:  [name] is a 83 y.o. male here for follow-up of GERD/ gas bloat. Prior EGD demonstrated early a grade 1 esophageal varices. He needs a screening hepatic ultrasound. No beta blocker yet. He felt AcipHex did better than Protonix but the [place] did not want to pay for it. He smoked for 50 years. He asked me about getting a "screening" chest x-ray.  Past Medical History:  Diagnosis Date  . BPH (benign prostatic hyperplasia)   . CAD (coronary artery disease) of artery bypass graft 2006  . Fatty liver   . GERD (gastroesophageal reflux disease)   . Gilbert's syndrome   . Hiatal hernia   . History of nuclear stress test 11/2007   bruce myoview; normal pattern of persuion in all regions; post-stress EF 70%; low risk   . HTN (hypertension)   . Neuropathy   . PAF (paroxysmal atrial fibrillation) ([place])   . PVD (peripheral vascular disease) ([place])    0-49% R & L ICA stenosis (2013)   . Renal artery aneurysm ([place])   . S/P CABG (coronary artery bypass graft) 2006  . Schatzki's ring    non critical  . Thrombocytopenia ([place] [address])     Past Surgical History:  Procedure Laterality Date  . CATARACT EXTRACTION  2011  . COLONOSCOPY  2006   Dr. Margarito Courser pancolonic diverticula  . COLONOSCOPY  01/07/2012   Dr. Abbe Amsterdam normal rectum, pancolonic diverticulosis, hyperplastic polyp  . COLONOSCOPY N/A 03/20/2015   KPV:VZSMOLMBEM diverticulosis  . CORONARY ARTERY BYPASS GRAFT  04/2004   LIMA to diagonal, SVG to ramus intermedius, SVG to PDA; placed stent for crossed coronary arteries (Dr. Prescott Gum)  . ESOPHAGOGASTRODUODENOSCOPY  2006   Dr. Ruthell Rummage Schatzki's ring, s/p 58-F Maloney dilation  . ESOPHAGOGASTRODUODENOSCOPY  01/07/2012   Dr. Gareth Morgan ring, hiatal hernia, fundic gland polyp  . ESOPHAGOGASTRODUODENOSCOPY N/A 03/20/2015   LJQ:GBEEF 1  varices/HH Gastric polpy s/p bx  . rotater cuff     repair  . TONSILLECTOMY      Prior to Admission medications   Medication Sig Start Date End Date Taking? Authorizing Provider  allopurinol (ZYLOPRIM) 300 MG tablet Take 300 mg by mouth 4 (four) times daily as needed (for gout flare up).   Yes [provider]  ALPRAZolam Duanne Moron) 0.5 MG tablet Take 0.5 mg by mouth at bedtime as needed. Sleep.   Yes [provider]  amLODipine (NORVASC) 5 MG tablet Take 1 tablet (5 mg total) by mouth 2 (two) times daily. Patient taking differently: Take 10 mg by mouth daily.  10/07/13  Yes Hilty, Nadean Corwin, MD  apixaban (ELIQUIS) 5 MG TABS tablet Take 1 tablet (5 mg total) by mouth 2 (two) times daily. 01/24/16  Yes Hilty, Nadean Corwin, MD  finasteride (PROSCAR) 5 MG tablet Take 5 mg by mouth daily.   Yes [provider]  flunisolide (NASALIDE) 0.025 % SOLN Inhale 2 sprays into the lungs daily.    Yes [provider]  gabapentin (NEURONTIN) 300 MG capsule Take 300 mg by mouth 3 (three) times daily.     Yes [provider]  hydrochlorothiazide (HYDRODIURIL) 25 MG tablet TAKE ONE-HALF TABLET BY MOUTH ONCE DAILY Patient taking differently: TAKE ONE-HALF TABLET BY MOUTH ONCE DAILY. Takes as needed 02/28/16  Yes Hilty, Nadean Corwin, MD  levothyroxine (SYNTHROID, LEVOTHROID) 50 MCG tablet Take by mouth daily before breakfast. Pt takes 171mg  Yes [provider]  metoprolol tartrate (LOPRESSOR) 25 MG tablet Take 0.5 tablets (12.5 mg total) by mouth 2 (two) times daily. <PLEASE MAKE APPOINTMENT FOR REFILLS> Patient taking differently: Take 12.5 mg by mouth as needed. <PLEASE MAKE APPOINTMENT FOR REFILLS> 07/26/15  Yes Hilty, Nadean Corwin, MD  pantoprazole (PROTONIX) 40 MG tablet Take 40 mg by mouth daily.   Yes [provider]  tamsulosin (FLOMAX) 0.4 MG CAPS capsule Take 0.4 mg by mouth daily.   Yes [provider]  doxazosin (CARDURA) 8 MG tablet Take 4 mg  by mouth daily.    [provider]  edoxaban (SAVAYSA) 60 MG TABS tablet Take 60 mg by mouth daily. Patient not taking: Reported on 03/25/2016 01/18/16   Pixie Casino, MD  Influenza vac split quadrivalent PF (FLUARIX) 0.5 ML injection Inject 0.5 mLs into the muscle once.    [provider]  pneumococcal 23 valent vaccine (PNU-IMMUNE) 25 MCG/0.5ML injection Inject 0.5 mLs into the muscle once.    [provider]  Probiotic Product (ALIGN) 4 MG CAPS Take 4 mg by mouth daily. Patient not taking: Reported on 03/25/2016 10/03/14   Mahala Menghini, PA-C  Probiotic Product (PROBIOTIC DAILY PO) Take by mouth daily. [place] probiotic    [provider]  RABEprazole (ACIPHEX) 20 MG tablet TAKE ONE TABLET BY MOUTH ONCE DAILY BEFORE BREAKFAST Patient not taking: Reported on 08/19/2016 07/07/16   Annitta Needs, NP    Allergies as of 08/19/2016 - Review Complete 08/19/2016  Allergen Reaction Noted  . Antihistamines, chlorpheniramine-type Rash 08/26/2011    Family History  Problem Relation Age of Onset  . Heart attack Mother 71       deceased  . Heart attack Father 51       deceased  . Heart disease Brother        x2  . Diabetes Brother        x3  . Colon cancer Neg Hx     Social History   Social History  . Marital status: Married    Spouse name: N/A  . Number of children: N/A  . Years of education: N/A   Occupational History  . Not on file.   Social History Main Topics  . Smoking status: Former Smoker    Packs/day: 1.00    Years: 40.00    Types: Cigarettes    Quit date: 04/07/2004  . Smokeless tobacco: Never Used  . Alcohol use Yes     Comment: occassional drinker  . Drug use: No  . Sexual activity: No   Other Topics Concern  . Not on file   Social History Narrative  . No narrative on file    Review of Systems: See HPI, otherwise negative ROS  Physical Exam: BP (!) 150/63   Pulse 65   Temp (!) 96.9 F (36.1 C) (Oral)   Ht 5'  9" (1.753 m)   Wt 199 lb 3.2 oz (90.4 kg)   BMI 29.42 kg/m  General:   Alert,  Well-developed, well-nourished, pleasant and cooperative in NAD Neck:  Supple; no masses or thyromegaly. No significant cervical adenopathy. Lungs:  Clear throughout to auscultation.   No wheezes, crackles, or rhonchi. No acute distress. Heart:  Regular rate and rhythm; no murmurs, clicks, rubs,  or gallops. Abdomen: Non-distended, normal bowel sounds.  Soft and nontender without appreciable mass or hepatosplenomegaly.  Pulses:  Normal pulses noted. Extremities:  Without clubbing or edema.  Impression:  Pleasant 83 year old  Gentleman with likely NASH/cirrhosis. Well compensated.  He may need to start a nonselective beta blocker at some point, however, I think that may be slightly overkill with a degree of varices seen previously. I do think he ought to have a repeat EGD in a year to help stratify risk.  He needs an ultrasound every 6 months.  He has been vaccinated against hepatitis A and B. He desires a screening exam for lung cancer given long-standing smoking.  A chest CT may be the better way to go. Will check into this further.  AcipHex did better with reflux then Protonix as the patient describes.      Recommendations:  Hepatic ultrasound - screening;  may also get further screening exam of his lungs in the near future.  Continue protonix 40 mg daily - office staff to contact the [facility] and see if we can get a benefit for AcipHex  Office visit in 6 months  Repeat EGD in 1 year     Notice: This dictation was prepared with Dragon dictation along with smaller phrase technology. Any transcriptional errors that result from this process are unintentional and may not be corrected upon review.

## 2016-08-19 NOTE — Patient Instructions (Signed)
Hepatic ultrasound - screening  Continue protonix 40 mg daily - will check with the [facility] about Aciphex  Office visit in 6 months  Repeat EGD in 1 year

## 2016-08-20 ENCOUNTER — Other Ambulatory Visit: Payer: Self-pay

## 2016-08-21 ENCOUNTER — Ambulatory Visit (HOSPITAL_COMMUNITY)
Admission: RE | Admit: 2016-08-21 | Discharge: 2016-08-21 | Disposition: A | Discharge status: Home / Self Care | Payer: Medicare Other | Source: Ambulatory Visit | Attending: Internal Medicine | Admitting: Internal Medicine

## 2016-08-21 DIAGNOSIS — K769 Liver disease, unspecified: Secondary | ICD-10-CM | POA: Diagnosis not present

## 2016-08-28 ENCOUNTER — Telehealth: Payer: Self-pay

## 2016-08-28 NOTE — Telephone Encounter (Signed)
-----   Message from Daneil Dolin, MD sent at 08/21/2016  8:25 AM EDT ----- Thanks. Please let him know what Dr. Thornton Papas said. He probably ought to have a screening CT. I willbe happy to order that for him. That's is out of the scope of our practice but will do that to help him. Alternatively, he can go back to the [place] and get them to proceed. We don't have much control of what insurance will  pay for. I would not order a chest x-ray. That's inadequate nowadays. ----- Message ----- From: Marlou Porch, CMA Sent: 08/20/2016   8:24 AM To: Daneil Dolin, MD  Dr.Boles said that if you are wanting to do a Screening for lung cancer then we should do a CT scan but his insurance will not pay for the CT screening lung cancer due to his age. I can order a CT chest but I have to have another reason beside screening for lung cancer and smoker. Please advise     [name]

## 2016-08-28 NOTE — Telephone Encounter (Signed)
Pt is aware and he will talk to the [facility] [place] about getting this done

## 2016-08-28 NOTE — Progress Notes (Signed)
PATIENT ON RECALL

## 2016-09-05 ENCOUNTER — Telehealth: Payer: Self-pay

## 2016-09-05 NOTE — Telephone Encounter (Signed)
I called the [facility] and spoke with Alberteen Spindle RN again. She said it will be very difficult to get Aciphex covered for the pt. She needs me to send her additional office visits and endoscopy reports with what he has tried and failed. She said she needs the whole ov note and not just a letter from the MD and fax them to [phone]. She also said the pt may have to come to the [place] for a one time face to face visit with their GI doctors to discuss. I have also called his local pharmacy to see if the Aciphex was covered when we sent it in in June. I had to leave a message with [facility] in [place]. Asked them to either call or fax me and let me know how the coverage was.   I have picked several ov notes and the last EGD and have faxed them to [facility]ch [facility] to see if she can help. Will wait for her response.

## 2016-09-18 ENCOUNTER — Telehealth: Payer: Self-pay

## 2016-09-18 ENCOUNTER — Other Ambulatory Visit: Payer: Self-pay

## 2016-09-18 DIAGNOSIS — R109 Unspecified abdominal pain: Secondary | ICD-10-CM

## 2016-09-18 DIAGNOSIS — K838 Other specified diseases of biliary tract: Secondary | ICD-10-CM

## 2016-09-18 NOTE — Telephone Encounter (Signed)
Pt was calling about his [place] results. I informed he again what the results was and that he needed to have a MRCP and blood work. He is set up for MRCP on 09/26/16 @ 8:00 am. He is aware and blood work is in the mail.

## 2016-09-26 ENCOUNTER — Other Ambulatory Visit: Payer: Self-pay | Admitting: Internal Medicine

## 2016-09-26 ENCOUNTER — Ambulatory Visit (HOSPITAL_COMMUNITY)
Admission: RE | Admit: 2016-09-26 | Discharge: 2016-09-26 | Disposition: A | Discharge status: Home / Self Care | Payer: Medicare Other | Source: Ambulatory Visit | Attending: Internal Medicine | Admitting: Internal Medicine

## 2016-09-26 DIAGNOSIS — R161 Splenomegaly, not elsewhere classified: Secondary | ICD-10-CM | POA: Diagnosis not present

## 2016-09-26 DIAGNOSIS — K760 Fatty (change of) liver, not elsewhere classified: Secondary | ICD-10-CM | POA: Insufficient documentation

## 2016-09-26 DIAGNOSIS — D734 Cyst of spleen: Secondary | ICD-10-CM | POA: Insufficient documentation

## 2016-09-26 DIAGNOSIS — R109 Unspecified abdominal pain: Secondary | ICD-10-CM

## 2016-09-26 DIAGNOSIS — R933 Abnormal findings on diagnostic imaging of other parts of digestive tract: Secondary | ICD-10-CM | POA: Diagnosis not present

## 2016-09-26 DIAGNOSIS — N281 Cyst of kidney, acquired: Secondary | ICD-10-CM | POA: Diagnosis not present

## 2016-09-26 DIAGNOSIS — K838 Other specified diseases of biliary tract: Secondary | ICD-10-CM | POA: Insufficient documentation

## 2016-09-26 LAB — POCT I-STAT CREATININE: CREATININE: 1.3 mg/dL — AB (ref 0.61–1.24)

## 2016-09-26 LAB — HEPATIC FUNCTION PANEL
ALBUMIN: 4.3 g/dL (ref 3.6–5.1)
ALK PHOS: 97 U/L (ref 40–115)
ALT: 35 U/L (ref 9–46)
AST: 51 U/L — AB (ref 10–35)
Bilirubin, Direct: 0.4 mg/dL — ABNORMAL HIGH (ref <=0.2)
Indirect Bilirubin: 0.9 mg/dL (ref 0.2–1.2)
TOTAL PROTEIN: 6.6 g/dL (ref 6.1–8.1)
Total Bilirubin: 1.3 mg/dL — ABNORMAL HIGH (ref 0.2–1.2)

## 2016-09-26 MED ORDER — GADOBENATE DIMEGLUMINE 529 MG/ML IV SOLN
18.0000 mL | Freq: Once | INTRAVENOUS | Status: AC | PRN
  Administered 2016-09-26: 18 mL via INTRAVENOUS

## 2016-09-30 DIAGNOSIS — R2231 Localized swelling, mass and lump, right upper limb: Secondary | ICD-10-CM | POA: Diagnosis not present

## 2016-10-02 ENCOUNTER — Telehealth: Payer: Self-pay | Admitting: Internal Medicine

## 2016-10-02 NOTE — Telephone Encounter (Signed)
See result note, tried to call- NA-no voicemail.

## 2016-10-02 NOTE — Telephone Encounter (Signed)
Pt called to see if his MRI and lab results were back yet. Please call him at [phone]

## 2016-10-07 ENCOUNTER — Other Ambulatory Visit: Payer: Self-pay | Admitting: Internal Medicine

## 2016-10-07 DIAGNOSIS — R945 Abnormal results of liver function studies: Principal | ICD-10-CM

## 2016-10-07 DIAGNOSIS — R7989 Other specified abnormal findings of blood chemistry: Secondary | ICD-10-CM

## 2016-10-08 NOTE — Progress Notes (Signed)
ON RECALL FOR ULTRASOUND IN 6 MONTHS

## 2016-10-09 ENCOUNTER — Other Ambulatory Visit: Payer: Self-pay

## 2016-10-09 DIAGNOSIS — R7989 Other specified abnormal findings of blood chemistry: Secondary | ICD-10-CM

## 2016-10-09 DIAGNOSIS — R945 Abnormal results of liver function studies: Principal | ICD-10-CM

## 2016-10-21 ENCOUNTER — Telehealth: Payer: Self-pay | Admitting: Internal Medicine

## 2016-10-21 NOTE — Telephone Encounter (Signed)
Returned call to patient of Dr. Debara Pickett who is concerned about his blood pressure. His BP was 152/45 HR 62. He had been sitting down resting. His BP at his last visit 01/2016 his BP was 148/58  [facility] recommended that he STOP cardura - but he has taken some since this recommendation to help his prostate - he has Rx(s) for flomax or finasteride as well. He takes amlodipine 37m on occasion - instructions are 536mBID. He stopped hctz 12.29m41m few days ago b/c he felt like he was taking "too much stuff". He has Rx for lopressor to use PRN but does not take often.   Explained to patient that it is hard to advise him on his BP and any potential med changes if he is not taking his meds as prescribed. Advised that he monitor home BPs no more than twice daily for 7-10 days and call w/readings and record which meds he takes on each day. Also advised a BP check appt w/pharmacist to review meds as well, which he declined. He is not due to see MD until 01/2017.   Will route to MD for advice, if any

## 2016-10-21 NOTE — Telephone Encounter (Signed)
New message    Pt is calling.  Pt c/o BP issue: STAT if pt c/o blurred vision, one-sided weakness or slurred speech  1. What are your last 5 BP readings? 152/45 p-62  2. Are you having any other symptoms (ex. Dizziness, headache, blurred vision, passed out)? No symptoms  3. What is your BP issue? Pt is concerned with his BP.

## 2016-10-22 DIAGNOSIS — M545 Low back pain: Secondary | ICD-10-CM | POA: Diagnosis not present

## 2016-10-22 DIAGNOSIS — E663 Overweight: Secondary | ICD-10-CM | POA: Diagnosis not present

## 2016-10-22 DIAGNOSIS — Z6829 Body mass index (BMI) 29.0-29.9, adult: Secondary | ICD-10-CM | POA: Diagnosis not present

## 2016-10-23 NOTE — Telephone Encounter (Signed)
I would encourage him to take the amlodipine 5 mg BID.  Dr. Lemmie Evens

## 2016-10-24 NOTE — Telephone Encounter (Signed)
Informed pt of Dr. Lysbeth Penner recommendations, advised to continue the amlodipine at 64m twice daily consistently, he may call back in 1.5-2 weeks to update u[place]on his BP trends, or sooner should he have new concerns.  Pt verbalized understanding and thanks.

## 2016-10-28 ENCOUNTER — Other Ambulatory Visit: Payer: Self-pay

## 2016-10-28 DIAGNOSIS — K769 Liver disease, unspecified: Secondary | ICD-10-CM

## 2016-11-17 DIAGNOSIS — Z23 Encounter for immunization: Secondary | ICD-10-CM | POA: Diagnosis not present

## 2016-11-28 ENCOUNTER — Telehealth: Payer: Self-pay | Admitting: Internal Medicine

## 2016-11-28 NOTE — Telephone Encounter (Signed)
New Message     Pt c/o swelling: STAT is pt has developed SOB within 24 hours  1) How much weight have you gained and in what time span? no  2) If swelling, where is the swelling located? Ankles   3) Are you currently taking a fluid pill? yes  4) Are you currently SOB? no  5) Do you have a log of your daily weights (if so, list)? no  6) Have you gained 3 pounds in a day or 5 pounds in a week? No   7) Have you traveled recently? no

## 2016-11-28 NOTE — Telephone Encounter (Signed)
Spoke with patient - did not take am dose of amlodipine (he takes 5 mg bid) because of swelling.  Advised that he cut dose back to 5 mg once daily and take his hctz 12.5 mg daily as well.  (SCr 1.3)  Explained that this will hopefully help to avoid a rise in BP.  Patient is to call back after 4-5 days if no improvement or BP elevated.  Patient voiced understanding.

## 2016-11-28 NOTE — Telephone Encounter (Signed)
Returned call to pt he states that last month he increased amlodipine and has had bilat ankle swelling since. He states that heis weight is 197. For the last week he has taken the HCTZ daily, he states that he only takes this PRN. Denies any SOB or chest pain no BP taken. Please advise, pt notified that with the late hour we may not get any direction today. He will await call back with direction. Will defer to pharmacist.

## 2016-12-11 DIAGNOSIS — K769 Liver disease, unspecified: Secondary | ICD-10-CM | POA: Diagnosis not present

## 2016-12-12 LAB — CBC WITH DIFFERENTIAL/PLATELET
BASOS ABS: 28 {cells}/uL (ref 0–200)
Basophils Relative: 0.6 %
EOS ABS: 138 {cells}/uL (ref 15–500)
Eosinophils Relative: 3 %
HEMATOCRIT: 31.9 % — AB (ref 38.5–50.0)
Hemoglobin: 11.4 g/dL — ABNORMAL LOW (ref 13.2–17.1)
Lymphs Abs: 966 cells/uL (ref 850–3900)
MCH: 35.1 pg — AB (ref 27.0–33.0)
MCHC: 35.7 g/dL (ref 32.0–36.0)
MCV: 98.2 fL (ref 80.0–100.0)
MPV: 12.1 fL (ref 7.5–12.5)
Monocytes Relative: 9.5 %
Neutro Abs: 3031 cells/uL (ref 1500–7800)
Neutrophils Relative %: 65.9 %
PLATELETS: 133 10*3/uL — AB (ref 140–400)
RBC: 3.25 10*6/uL — ABNORMAL LOW (ref 4.20–5.80)
RDW: 12.4 % (ref 11.0–15.0)
TOTAL LYMPHOCYTE: 21 %
WBC: 4.6 10*3/uL (ref 3.8–10.8)
WBCMIX: 437 {cells}/uL (ref 200–950)

## 2016-12-12 LAB — COMPREHENSIVE METABOLIC PANEL
AG Ratio: 1.6 (calc) (ref 1.0–2.5)
ALKALINE PHOSPHATASE (APISO): 91 U/L (ref 40–115)
ALT: 30 U/L (ref 9–46)
AST: 50 U/L — AB (ref 10–35)
Albumin: 3.9 g/dL (ref 3.6–5.1)
BILIRUBIN TOTAL: 1 mg/dL (ref 0.2–1.2)
BUN: 14 mg/dL (ref 7–25)
CALCIUM: 8.7 mg/dL (ref 8.6–10.3)
CHLORIDE: 105 mmol/L (ref 98–110)
CO2: 26 mmol/L (ref 20–32)
CREATININE: 1.09 mg/dL (ref 0.70–1.11)
Globulin: 2.4 g/dL (calc) (ref 1.9–3.7)
Glucose, Bld: 101 mg/dL — ABNORMAL HIGH (ref 65–99)
Potassium: 4.2 mmol/L (ref 3.5–5.3)
Sodium: 139 mmol/L (ref 135–146)
Total Protein: 6.3 g/dL (ref 6.1–8.1)

## 2016-12-12 LAB — PROTIME-INR
INR: 1
Prothrombin Time: 10.5 s (ref 9.0–11.5)

## 2016-12-16 ENCOUNTER — Other Ambulatory Visit: Payer: Self-pay

## 2016-12-16 DIAGNOSIS — L821 Other seborrheic keratosis: Secondary | ICD-10-CM | POA: Diagnosis not present

## 2016-12-16 DIAGNOSIS — K219 Gastro-esophageal reflux disease without esophagitis: Secondary | ICD-10-CM

## 2016-12-16 DIAGNOSIS — K760 Fatty (change of) liver, not elsewhere classified: Secondary | ICD-10-CM

## 2016-12-16 DIAGNOSIS — Z85828 Personal history of other malignant neoplasm of skin: Secondary | ICD-10-CM | POA: Diagnosis not present

## 2016-12-16 DIAGNOSIS — D485 Neoplasm of uncertain behavior of skin: Secondary | ICD-10-CM | POA: Diagnosis not present

## 2016-12-16 DIAGNOSIS — L432 Lichenoid drug reaction: Secondary | ICD-10-CM | POA: Diagnosis not present

## 2016-12-16 DIAGNOSIS — L570 Actinic keratosis: Secondary | ICD-10-CM | POA: Diagnosis not present

## 2016-12-16 NOTE — Progress Notes (Signed)
Labs stable. MELD 7.  Repeat CMET, CBC, PT/inr in six months.

## 2016-12-17 ENCOUNTER — Telehealth: Payer: Self-pay | Admitting: Internal Medicine

## 2016-12-17 NOTE — Telephone Encounter (Signed)
PLEASE CALL PATIENT IF HIS LABS ARE READY

## 2016-12-17 NOTE — Telephone Encounter (Signed)
Spoke with pt, results given.

## 2016-12-23 ENCOUNTER — Ambulatory Visit (INDEPENDENT_AMBULATORY_CARE_PROVIDER_SITE_OTHER): Payer: Medicare Other | Admitting: Internal Medicine

## 2016-12-23 ENCOUNTER — Encounter: Payer: Self-pay | Admitting: Internal Medicine

## 2016-12-23 VITALS — BP 130/52 | HR 58 | Ht 69.0 in | Wt 199.4 lb

## 2016-12-23 DIAGNOSIS — Z13220 Encounter for screening for lipoid disorders: Secondary | ICD-10-CM | POA: Insufficient documentation

## 2016-12-23 DIAGNOSIS — Z951 Presence of aortocoronary bypass graft: Secondary | ICD-10-CM

## 2016-12-23 DIAGNOSIS — I4891 Unspecified atrial fibrillation: Secondary | ICD-10-CM

## 2016-12-23 DIAGNOSIS — I10 Essential (primary) hypertension: Secondary | ICD-10-CM | POA: Diagnosis not present

## 2016-12-23 NOTE — Progress Notes (Signed)
OFFICE NOTE  Chief Complaint:  No complaints  Primary Care Physician: Redmond School, MD  HPI:  [name] is a 82 year old gentleman who had bypass in 2006, a normal nuclear study in 2009 and has done well without any episodes of angina. He has remained active and does exercise several times a week and has had no worsening shortness of breath, palpitations, presyncope or syncopal symptoms. He also has a history of right carotid disease which he was told to be about 60% at the time of bypass, however, recent Dopplers show only mild disease. He does have a very faint bruit. He last saw Shane Fuller, PA-C, in the office this past summer. He was having problems with elevated blood pressures at night. It was recommended that he change his blood pressure medications around, however he did not make that change.  He was recently admitted to [facility] for dehydration in the setting of pneumonia or viral influenza. He was taking his diuretics in addition to ongoing fluid losses. This cause orthostatic hypotension. His diuretic was held and his lisinopril was stopped due to worsening renal function. He was switched to amlodipine for better blood pressure control, but has not been taking the medicine due to confusion of his medicines. Currently reports his blood pressures have improved and is not have any significant swelling. In fact he is now hypertensive again. He is recently switched from warfarin over to Xarelto for better control of anticoagulation for his A. fib. He seems to be able to get the medication now with out undue cost.  [name] turns today for followup. He occasionally gets some lightheadedness. He's noted some blood pressures up into the 190s at home. He says however he feels bad when his blood pressure is more in the 120s to 130s.  I saw [name] back today in the office. Overall he reports doing fairly well. He decreased his exercise somewhat due to his wife being sick but is  interested in getting back into exercise routine. His last stress test was 7 years ago. His bypass was now 10 years ago. He denies any chest pain or worsening shortness of breath. Blood pressure is running somewhat high. His diuretics were stopped due to worsening renal function which seems to have normalized.  [name] returns today for follow-up. His main complaint has to do with pain in his back and decreased function in his legs. He says it's very difficult for him to get around. Although he has pain in his legs his symptoms are not necessarily consistent with claudication. He does have a history of decreased ABIs in the past and has not had lower extremity arterial Dopplers in some time. I was also recently reminded that he does have a history of renal artery aneurysm which was noted on the CT scan. This has not been reassessed in several years. He denies any significant symptoms with atrial fibrillation has had no bleeding problems on Savaysa.  01/04/16  [name] was seen today in follow-up. He denies any worsening chest pain or shortness of breath. He continues to have some tightness in his legs. He had normal ABIs by Dopplers last year however will need a repeat of that. His renal Dopplers also failed to indicate any stenosis. He is not in atrial fibrillation today and is concerned about the cost of Savaysa. He is interested in switching NOACs. He also brought lab work from the [place] today which showed adequate thyroid levels, elevated uric acid at 8.1  and LDL of 27 with total cholesterol of 115. He is not on statins.  12/23/2016  [name] returns today for follow-up.  Over the past year he has done well.  He denies any worsening shortness of breath or chest pain.  He had lower extremity Dopplers and carotid Dopplers this past year which indicate no significant insufficiency or stenosis.  He denies any recurrent atrial fibrillation.  He switched from [facility] over to Eliquis.  He has not had any  recent lab work.  He tells me his last lipid profile which I reviewed from the [place] showed his total cholesterol of 115.  He has not been on statin therapy due to this.  Fortunately, he has not had any recurrent coronary disease.  He has had no progressive peripheral arterial disease as well.  PMHx:  Past Medical History:  Diagnosis Date  . BPH (benign prostatic hyperplasia)   . CAD (coronary artery disease) of artery bypass graft 2006  . Fatty liver   . GERD (gastroesophageal reflux disease)   . Gilbert's syndrome   . Hiatal hernia   . History of nuclear stress test 11/2007   bruce myoview; normal pattern of persuion in all regions; post-stress EF 70%; low risk   . HTN (hypertension)   . Neuropathy   . PAF (paroxysmal atrial fibrillation) ([place])   . PVD (peripheral vascular disease) ([place])    0-49% R & L ICA stenosis (2013)   . Renal artery aneurysm ([place])   . S/P CABG (coronary artery bypass graft) 2006  . Schatzki's ring    non critical  . Thrombocytopenia ([place])     Past Surgical History:  Procedure Laterality Date  . CATARACT EXTRACTION  2011  . COLONOSCOPY  2006   Dr. Margarito Courser pancolonic diverticula  . COLONOSCOPY  01/07/2012   Dr. Abbe Amsterdam normal rectum, pancolonic diverticulosis, hyperplastic polyp  . COLONOSCOPY N/A 03/20/2015   MGN:[id number] diverticulosis  . CORONARY ARTERY BYPASS GRAFT  04/2004   LIMA to diagonal, SVG to ramus intermedius, SVG to PDA; placed stent for crossed coronary arteries (Dr. Prescott Gum)  . ESOPHAGOGASTRODUODENOSCOPY  2006   Dr. Ruthell Rummage Schatzki's ring, s/p 58-F Maloney dilation  . ESOPHAGOGASTRODUODENOSCOPY  01/07/2012   Dr. Gareth Morgan ring, hiatal hernia, fundic gland polyp  . ESOPHAGOGASTRODUODENOSCOPY N/A 03/20/2015   VQX:IHWTU 1 varices/HH Gastric polpy s/p bx  . rotater cuff     repair  . TONSILLECTOMY      FAMHx:  Family History  Problem Relation Age of Onset  . Heart attack Mother 37       deceased  .  Heart attack Father 25       deceased  . Heart disease Brother        x2  . Diabetes Brother        x3  . Colon cancer Neg Hx     SOCHx:   reports that he quit smoking about 12 years ago. His smoking use included cigarettes. He has a 40.00 pack-year smoking history. he has never used smokeless tobacco. He reports that he drinks alcohol. He reports that he does not use drugs.  ALLERGIES:  Allergies  Allergen Reactions  . Antihistamines, Chlorpheniramine-Type Rash    Patient states he cant take large doses of antihistamines    ROS: Pertinent items noted in HPI and remainder of comprehensive ROS otherwise negative.  HOME MEDS: Current Outpatient Medications  Medication Sig Dispense Refill  . allopurinol (ZYLOPRIM) 300 MG tablet Take 300 mg by mouth 4 (four)  times daily as needed (for gout flare up).    . ALPRAZolam (XANAX) 0.5 MG tablet Take 0.5 mg by mouth at bedtime as needed. Sleep.    Marland Kitchen amLODipine (NORVASC) 5 MG tablet Take 1 tablet (5 mg total) by mouth 2 (two) times daily. (Patient taking differently: Take 10 mg by mouth daily. ) 180 tablet 3  . apixaban (ELIQUIS) 5 MG TABS tablet Take 1 tablet (5 mg total) by mouth 2 (two) times daily. 180 tablet 3  . doxazosin (CARDURA) 8 MG tablet Take 4 mg by mouth daily.    Marland Kitchen edoxaban (SAVAYSA) 60 MG TABS tablet Take 60 mg by mouth daily. 90 tablet 1  . finasteride (PROSCAR) 5 MG tablet Take 5 mg by mouth daily.    . flunisolide (NASALIDE) 0.025 % SOLN Inhale 2 sprays into the lungs daily.     Marland Kitchen gabapentin (NEURONTIN) 300 MG capsule Take 300 mg by mouth 3 (three) times daily.      . hydrochlorothiazide (HYDRODIURIL) 25 MG tablet TAKE ONE-HALF TABLET BY MOUTH ONCE DAILY (Patient taking differently: TAKE ONE-HALF TABLET BY MOUTH ONCE DAILY. Takes as needed) 30 tablet 11  . Influenza vac split quadrivalent PF (FLUARIX) 0.5 ML injection Inject 0.5 mLs into the muscle once.    . Levothyroxine Sodium 112 MCG CAPS Take 112 mcg by mouth daily  before breakfast. Pt takes 165mg    . metoprolol tartrate (LOPRESSOR) 25 MG tablet Take 0.5 tablets (12.5 mg total) by mouth 2 (two) times daily. <PLEASE MAKE APPOINTMENT FOR REFILLS> (Patient taking differently: Take 12.5 mg by mouth as needed. <PLEASE MAKE APPOINTMENT FOR REFILLS>) 90 tablet 0  . pantoprazole (PROTONIX) 40 MG tablet Take 40 mg by mouth daily.    . Probiotic Product (ALIGN) 4 MG CAPS Take 4 mg by mouth daily. 21 capsule 0  . Probiotic Product (PROBIOTIC DAILY PO) Take by mouth daily. SBridgeportprobiotic    . RABEprazole (ACIPHEX) 20 MG tablet TAKE ONE TABLET BY MOUTH ONCE DAILY BEFORE BREAKFAST 30 tablet 1  . tamsulosin (FLOMAX) 0.4 MG CAPS capsule Take 0.4 mg by mouth daily.    . pneumococcal 23 valent vaccine (PNU-IMMUNE) 25 MCG/0.5ML injection Inject 0.5 mLs into the muscle once.     No current facility-administered medications for this visit.     LABS/IMAGING: No results found for this or any previous visit (from the past 48 hour(s)). No results found.  VITALS: BP (!) 130/52   Pulse (!) 58   Ht 5' 9"  (1.753 m)   Wt 199 lb 6.4 oz (90.4 kg)   BMI 29.45 kg/m   EXAM: General appearance: alert and no distress Neck: no carotid bruit and no JVD Lungs: clear to auscultation bilaterally Heart: regular rate and rhythm, S1, S2 normal, no murmur, click, rub or gallop Abdomen: soft, non-tender; bowel sounds normal; no masses,  no organomegaly Extremities: extremities normal, atraumatic, no cyanosis or edema Pulses: 2+ and symmetric Skin: Skin color, texture, turgor normal. No rashes or lesions Neurologic: Grossly normal Psych: Mood, affect normal  EKG: Sinus bradycardia first-degree AV block, nonspecific IVCD at 58-personally reviewed  ASSESSMENT: 1. Coronary artery disease status post three-vessel CABG in 2006 (LIMA to diagonal, SVG to PDA and SVG to ramus intermedius) 2. Negative Myoview stress test in 2016  3. Hypertension 4. Mild carotid artery  disease 5. Mild dyspnea with marked exertion 6. PAF-on Eliquis 7. Leg pain and weakness with walking 8. Renal Artery aneurysm  PLAN: 1.   Mr. PSkillenhas  not had any recurrent anginal symptoms.  He is now 12 years past bypass.  He had a negative Myoview in 2016.  He has mild carotid artery disease which is been stable.  There is a renal artery aneurysm which is not changed.  He has had PAF but no recent atrial fibrillation and is on Eliquis.  Blood pressure is at goal.  He is not on statin therapy, as his total cholesterol was 115 recently.  We will repeat a lipid profile.  Follow-up annually or sooner as necessary.  Pixie Casino, MD, [facility], [place] Director of the Advanced Lipid Disorders &  Cardiovascular Risk Reduction Clinic Attending Cardiologist  Direct Dial: [phone]  Fax: [phone]  Website:  [email or web address] 12/23/2016, 1:59 PM

## 2016-12-23 NOTE — Patient Instructions (Signed)
Your physician recommends that you return for lab work fasting - to check cholesterol   Your physician wants you to follow-up in: one year with Dr. Debara Pickett. You will receive a reminder letter in the mail two months in advance. If you don't receive a letter, please call our office to schedule the follow-up appointment.

## 2017-01-15 ENCOUNTER — Telehealth: Payer: Self-pay | Admitting: Internal Medicine

## 2017-01-15 DIAGNOSIS — C44622 Squamous cell carcinoma of skin of right upper limb, including shoulder: Secondary | ICD-10-CM | POA: Diagnosis not present

## 2017-01-15 NOTE — Telephone Encounter (Signed)
Letter mailed

## 2017-01-15 NOTE — Telephone Encounter (Signed)
RECALL FOR ULTRASOUND 

## 2017-02-05 ENCOUNTER — Telehealth: Payer: Self-pay | Admitting: Internal Medicine

## 2017-02-05 NOTE — Telephone Encounter (Signed)
Viagra is fine - use the lowest dose possible (50 mg) - do not take with nitrates.  Dr. Lemmie Evens

## 2017-02-05 NOTE — Telephone Encounter (Signed)
Please call,wants to know if it is alright  for him to take Viagara ?His doctor told him to check with you to see if this was alright before she gave him the prescription.

## 2017-02-05 NOTE — Telephone Encounter (Signed)
Pt notes his primary care Dr. Marland KitchenB" at [facility] was willing to prescribe him Viagra, but wanted to have him check w cardiology to verify OK to prescribe. Informed him I would see if Dr. Debara Pickett can review, and give any advice on this such as whether safe to take and if so, any guidance on dosing. Pt aware we will follow up w him.   He states he will probably need something sent to his physician at the [place], & when we call back he will provide a fax number to which we can send documentation.

## 2017-02-05 NOTE — Telephone Encounter (Signed)
Pt advised on our dosing recommendations as well as avoidance of nitrates d/t related unsafe drop in BP. Pt provided fax number to Dr. Payton Emerald office. Will route note to communicate Dr. Lysbeth Penner recommendations.

## 2017-02-25 ENCOUNTER — Other Ambulatory Visit: Payer: Self-pay | Admitting: Internal Medicine

## 2017-02-27 ENCOUNTER — Telehealth: Payer: Self-pay | Admitting: Internal Medicine

## 2017-02-27 NOTE — Telephone Encounter (Signed)
Pt called following up on [place] fax for Viagra around 3 wks ago.   Please call pt when done.

## 2017-02-27 NOTE — Telephone Encounter (Signed)
Called patient and notified him that on Feb 05, 2017 [facility] RN faxed over a note to Dr. Tarri Glenn. He states he has not heard from the [place] in [place] but put a call in to Dr. Tarri Glenn off this AM to inquire about this. Advised would re-fax requested info and verified fax # with patient.   Fax: [phone]

## 2017-03-11 ENCOUNTER — Telehealth: Payer: Self-pay | Admitting: Internal Medicine

## 2017-03-11 DIAGNOSIS — I4891 Unspecified atrial fibrillation: Secondary | ICD-10-CM

## 2017-03-11 DIAGNOSIS — E6609 Other obesity due to excess calories: Secondary | ICD-10-CM | POA: Diagnosis not present

## 2017-03-11 DIAGNOSIS — I10 Essential (primary) hypertension: Secondary | ICD-10-CM

## 2017-03-11 DIAGNOSIS — Z1389 Encounter for screening for other disorder: Secondary | ICD-10-CM | POA: Diagnosis not present

## 2017-03-11 DIAGNOSIS — J329 Chronic sinusitis, unspecified: Secondary | ICD-10-CM | POA: Diagnosis not present

## 2017-03-11 DIAGNOSIS — M13861 Other specified arthritis, right knee: Secondary | ICD-10-CM | POA: Diagnosis not present

## 2017-03-11 DIAGNOSIS — Z6831 Body mass index (BMI) 31.0-31.9, adult: Secondary | ICD-10-CM | POA: Diagnosis not present

## 2017-03-11 NOTE — Telephone Encounter (Signed)
Please call,his Amlodipine is causing swelling.He says he would to change to something else please.

## 2017-03-11 NOTE — Telephone Encounter (Signed)
Returned the call to the patient. He stated that he feels like his Amlodipine is causing lower extremity edema. He stated that he spoke with Dr. Debara Pickett about this previously and thought that he may be able to try a different medication.   He denies shortness of breath and stated that the swelling does subside overnight. According to the patient his blood pressure stays around 140/50's. Message routed to the provider for his recommendation.

## 2017-03-12 MED ORDER — AMLODIPINE BESYLATE 5 MG PO TABS: 5.0000 mg | ORAL_TABLET | Freq: Every day | ORAL | 3 refills | Status: DC

## 2017-03-12 MED ORDER — IRBESARTAN 150 MG PO TABS: 150.0000 mg | ORAL_TABLET | Freq: Every day | ORAL | 3 refills | Status: DC

## 2017-03-12 NOTE — Telephone Encounter (Signed)
Looks like he takes 10 mg amlodipine daily (either once or 5 mg BID) - would decrease dose to 5 mg daily. Add irbesartan 150 mg daily. Check BMET in 1 week.  Thanks.  Dr. Lemmie Evens

## 2017-03-12 NOTE — Telephone Encounter (Signed)
Patient aware of Dr. Lysbeth Penner recommendations. Informed patient of medication changes. Patient will call back if medications is too expensive and get a fax of order sent to [place]. Sent in prescription for Irbesartan 150 mg by mouth daily and changed amlodipine to 5 mg by mouth daily. Patient will come in on 03/19/17 for BMET. If patient has trouble getting medication today, he will call back to have his lab appointment moved.

## 2017-03-17 ENCOUNTER — Telehealth: Payer: Self-pay | Admitting: Internal Medicine

## 2017-03-17 NOTE — Telephone Encounter (Signed)
Returned call to patient, states [name] has not got in his irbesartan yet but should be getting it in this afternoon.  Wanted to see when he needs to get lab work completed.  Advised to take new medication for 1 week and get lab work completed.  Also advised to let us know if medication does not become available in the next 1-2 days.   Patient aware and verbalized understanding.

## 2017-03-17 NOTE — Telephone Encounter (Signed)
Pt still has not started medication and would like to r/s his visit  [facility]...the patient would like to know how long he should take this new med before her comes in.  PLEASE give pt a call.

## 2017-03-19 ENCOUNTER — Other Ambulatory Visit: Payer: Self-pay | Admitting: Internal Medicine

## 2017-03-19 NOTE — Telephone Encounter (Signed)
°*  STAT* If patient is at the pharmacy, call can be transferred to refill team.   1. Which medications need to be refilled? (please list name of each medication and dose if known) Irbesartan 150 mg ( Need a new prescription sent)   2. Which pharmacy/location (including street and city if local pharmacy) is medication to be sent to?[facility] [place] ph#[phone]  3. Do they need a 30 day or 90 day supply? [place]

## 2017-03-20 ENCOUNTER — Telehealth: Payer: Self-pay | Admitting: Internal Medicine

## 2017-03-20 MED ORDER — IRBESARTAN 150 MG PO TABS: 150.0000 mg | ORAL_TABLET | Freq: Every day | ORAL | 11 refills | Status: DC

## 2017-03-20 NOTE — Telephone Encounter (Signed)
Spoke with pt, refill phoned to the pharmacy.

## 2017-03-20 NOTE — Telephone Encounter (Signed)
F/U call: See note from 03-17-17  Patient calling back in regards to Irbesartan medication, states that pharmacy does not have prescription and was advised to call back .

## 2017-03-24 ENCOUNTER — Telehealth: Payer: Self-pay | Admitting: Internal Medicine

## 2017-03-24 NOTE — Telephone Encounter (Signed)
F/U call:  Patient returning call

## 2017-03-24 NOTE — Telephone Encounter (Signed)
Left message to call back  

## 2017-03-24 NOTE — Telephone Encounter (Signed)
Returned call to patient phone rings busy.

## 2017-03-24 NOTE — Telephone Encounter (Signed)
New message      Pt c/o medication issue:  1. Name of Medication: irbesartian (pt spelled)  2. How are you currently taking this medication (dosage and times per day)?  1x a day 150 mg   3. Are you having a reaction (difficulty breathing--STAT)? yes  4. What is your medication issue? Patient thinks it is giving him a UTI

## 2017-03-25 DIAGNOSIS — I4891 Unspecified atrial fibrillation: Secondary | ICD-10-CM | POA: Diagnosis not present

## 2017-03-25 DIAGNOSIS — I10 Essential (primary) hypertension: Secondary | ICD-10-CM | POA: Diagnosis not present

## 2017-03-25 LAB — BASIC METABOLIC PANEL
BUN / CREAT RATIO: 17 (ref 10–24)
BUN: 20 mg/dL (ref 8–27)
CALCIUM: 9.4 mg/dL (ref 8.6–10.2)
CHLORIDE: 101 mmol/L (ref 96–106)
CO2: 22 mmol/L (ref 20–29)
CREATININE: 1.16 mg/dL (ref 0.76–1.27)
GFR, EST AFRICAN AMERICAN: 68 mL/min/{1.73_m2} (ref >59)
GFR, EST NON AFRICAN AMERICAN: 59 mL/min/{1.73_m2} — AB (ref >59)
Glucose: 95 mg/dL (ref 65–99)
Potassium: 4.4 mmol/L (ref 3.5–5.2)
Sodium: 139 mmol/L (ref 134–144)

## 2017-03-25 NOTE — Telephone Encounter (Signed)
Patient walked in requesting lab work to be done. Per chart review, MD added irbesartan and requested a BMET 1 week after adding new med. Patient states he has taken med for 6 days. Provided printed lab order and escorted patient to [facility].   He also states he has increased urination and some burning sensation since starting irbesartan. Advised would ask clinic pharmacy staff about SE and notify him accordingly.   Also provided patient with printed lab order for lipid panel (ordered Nov 2018) and asked he come by for fasting labs at his convenience.

## 2017-03-26 NOTE — Telephone Encounter (Signed)
Irbesartan is not know to cause urinary problems.  Patient should follow up with PCP or urologist

## 2017-03-27 ENCOUNTER — Telehealth: Payer: Self-pay | Admitting: Internal Medicine

## 2017-03-27 DIAGNOSIS — K769 Liver disease, unspecified: Secondary | ICD-10-CM

## 2017-03-27 NOTE — Telephone Encounter (Signed)
LMTCB

## 2017-03-27 NOTE — Telephone Encounter (Signed)
Called patient on cell phone [phone] Patient called with pharmacist recommendations/advice He is aware that his labs look fine  He is needing a printed [place] advised this can be provided once MD is physically in office to sign script.  He would like to know if he can take viagra 555m- if so, will Dr. HiDebara Pickettrescribe this or [place] [facility] [place]  Will route to MD

## 2017-03-27 NOTE — Telephone Encounter (Signed)
Pt received his letter in the mail about scheduling his U/S. Pt also wants an apt with [facility]. Routing message.

## 2017-03-27 NOTE — Telephone Encounter (Signed)
U/s scheduled for 04/02/17 at 10:00am, arrival 9:45am, NPO after midnight. Called spoke with pt and is aware of appt details. Patient also scheduled for f/u 04/30/17 with [facility].

## 2017-03-27 NOTE — Telephone Encounter (Signed)
[phone]  PLEASE CALL PATIENT WHEN YOU CAN, HE HAS SOME QUESTIONS

## 2017-03-27 NOTE — Addendum Note (Signed)
Addended by: Inge Rise on: 03/27/2017 10:56 AM   Modules accepted: Orders

## 2017-03-30 NOTE — Telephone Encounter (Signed)
Would defer to his PCP - especially given his urinary problems. May need to see a urologist.  Dr. Lemmie Evens

## 2017-03-31 NOTE — Telephone Encounter (Signed)
Patient aware of MD recommendations. He voiced understanding.   He needs a signed Rx for irbesartan sent to the [place]  (f) [phone]

## 2017-04-02 ENCOUNTER — Ambulatory Visit (HOSPITAL_COMMUNITY)
Admission: RE | Admit: 2017-04-02 | Discharge: 2017-04-02 | Disposition: A | Discharge status: Home / Self Care | Payer: Medicare Other | Source: Ambulatory Visit | Attending: Internal Medicine | Admitting: Internal Medicine

## 2017-04-02 DIAGNOSIS — K7689 Other specified diseases of liver: Secondary | ICD-10-CM | POA: Diagnosis not present

## 2017-04-02 DIAGNOSIS — K769 Liver disease, unspecified: Secondary | ICD-10-CM | POA: Diagnosis not present

## 2017-04-06 MED ORDER — IRBESARTAN 150 MG PO TABS: 150.0000 mg | ORAL_TABLET | Freq: Every day | ORAL | 3 refills | Status: DC

## 2017-04-07 NOTE — Telephone Encounter (Signed)
Prescription faxed to [facility] at # requested

## 2017-04-20 ENCOUNTER — Telehealth: Payer: Self-pay | Admitting: Internal Medicine

## 2017-04-20 NOTE — Telephone Encounter (Signed)
Patient states he has not gotten irbesartan from [place]. He is NOT out of the medication. He asked that a printed Rx be mailed to him so he can take to [place]. Explained this will be done once MD is back in the office next week.

## 2017-04-20 NOTE — Telephone Encounter (Signed)
New Message    Pt c/o medication issue:  1. Name of Medication:  irbesartan (AVAPRO) 150 MG tablet 2. How are you currently taking this medication (dosage and times per day)? 150 mg daily   3. Are you having a reaction (difficulty breathing--STAT)? None   4. What is your medication issue?Patient is requesting that an rx be sent to the [facility] for the irbesartan. Per the note on 02/19 it was sent but he states the [place] does not have it. Please resend.

## 2017-04-28 MED ORDER — IRBESARTAN 150 MG PO TABS: 150.0000 mg | ORAL_TABLET | Freq: Every day | ORAL | 3 refills | Status: DC

## 2017-04-28 NOTE — Telephone Encounter (Signed)
Rx printed, signed, mailed

## 2017-04-30 ENCOUNTER — Ambulatory Visit (INDEPENDENT_AMBULATORY_CARE_PROVIDER_SITE_OTHER): Payer: Medicare Other | Admitting: Gastroenterology

## 2017-04-30 ENCOUNTER — Ambulatory Visit: Payer: Medicare Other | Admitting: Gastroenterology

## 2017-04-30 ENCOUNTER — Encounter: Payer: Self-pay | Admitting: Gastroenterology

## 2017-04-30 VITALS — BP 133/60 | HR 68 | Temp 96.9°F | Ht 69.0 in | Wt 201.8 lb

## 2017-04-30 DIAGNOSIS — K219 Gastro-esophageal reflux disease without esophagitis: Secondary | ICD-10-CM | POA: Diagnosis not present

## 2017-04-30 DIAGNOSIS — K769 Liver disease, unspecified: Secondary | ICD-10-CM | POA: Diagnosis not present

## 2017-04-30 NOTE — Patient Instructions (Signed)
1. Return to the office in six months.  2. We will contact in this summer when you are due for your next labs and liver ultrasound.

## 2017-04-30 NOTE — Assessment & Plan Note (Signed)
Cirrhosis likely related to fatty liver.  Currently up-to-date on labs and ultrasound.  Per Dr. Gala Romney, consider another upper endoscopy in 2020 to reevaluate esophageal varices.  Patient has been reluctant to non-selective beta-blocker.  We will have the patient come back in 6 months.  We will update his labs and ultrasound as scheduled in July.

## 2017-04-30 NOTE — Assessment & Plan Note (Signed)
Doing well on AcipHex.  Continue current regimen.  See back in 6 months.

## 2017-04-30 NOTE — Progress Notes (Signed)
Primary Care Physician: Redmond School, MD  Primary Gastroenterologist:  Garfield Cornea, MD   Chief Complaint  Patient presents with  . Gastroesophageal Reflux    f/u    HPI: [name] is a 84 y.o. male here for f/u GERD, cirrhosis. EGD 03/2015 showed Grade 1 esophageal varices, gastric polyp. Colonoscopy at same time with pancolonic diverticulosis.   Labs up to date, MELD 7 in 12/2016. Due again this summer. Stable mild anemia and thrombocytopenia. Ruq u/s in 03/2017 stable without hepatic mass. Plans to repeat in six months.   Clinically he feels well.  Recently he was started on Avapro which helps his lower extremity edema.  Bowel movements daily.  No blood in the stool or melena.  No abdominal pain.  Appetite is good.  Reflux is well controlled.  He had lots of questions about fatty liver/cirrhosis and we took time to discuss these questions.   Current Outpatient Medications  Medication Sig Dispense Refill  . allopurinol (ZYLOPRIM) 300 MG tablet Take 300 mg by mouth 4 (four) times daily as needed (for gout flare up).    . ALPRAZolam (XANAX) 0.5 MG tablet Take 0.5 mg by mouth at bedtime as needed. Sleep.    Marland Kitchen amLODipine (NORVASC) 5 MG tablet Take 1 tablet (5 mg total) by mouth daily. 90 tablet 3  . apixaban (ELIQUIS) 5 MG TABS tablet Take 1 tablet (5 mg total) by mouth 2 (two) times daily. 180 tablet 3  . doxazosin (CARDURA) 8 MG tablet Take 4 mg by mouth daily.    . finasteride (PROSCAR) 5 MG tablet Take 5 mg by mouth. doesn't take daily    . flunisolide (NASALIDE) 0.025 % SOLN Inhale 2 sprays into the lungs daily.     Marland Kitchen gabapentin (NEURONTIN) 300 MG capsule Take 300 mg by mouth 3 (three) times daily.      . hydrochlorothiazide (HYDRODIURIL) 25 MG tablet TAKE ONE-HALF TABLET BY MOUTH ONCE DAILY (Patient taking differently: TAKE ONE-HALF TABLET BY MOUTH ONCE DAILY. Takes as needed) 30 tablet 11  . irbesartan (AVAPRO) 150 MG tablet Take 1 tablet (150 mg total) by mouth  daily. 90 tablet 3  . Levothyroxine Sodium 112 MCG CAPS Take 112 mcg by mouth daily before breakfast. Pt takes 171mg    . metoprolol tartrate (LOPRESSOR) 25 MG tablet TAKE ONE-HALF TABLET BY MOUTH TWICE DAILY 90 tablet 2  . RABEprazole (ACIPHEX) 20 MG tablet TAKE ONE TABLET BY MOUTH ONCE DAILY BEFORE BREAKFAST 30 tablet 1  . tamsulosin (FLOMAX) 0.4 MG CAPS capsule Take 0.4 mg by mouth daily.     No current facility-administered medications for this visit.     Allergies as of 04/30/2017 - Review Complete 04/30/2017  Allergen Reaction Noted  . Antihistamines, chlorpheniramine-type Rash 08/26/2011    ROS:  General: Negative for anorexia, weight loss, fever, chills, fatigue, weakness. ENT: Negative for hoarseness, difficulty swallowing , nasal congestion. CV: Negative for chest pain, angina, palpitations, dyspnea on exertion, peripheral edema.  Respiratory: Negative for dyspnea at rest, dyspnea on exertion, cough, sputum, wheezing.  GI: See history of present illness. GU:  Negative for dysuria, hematuria, urinary incontinence, urinary frequency, nocturnal urination.  Endo: Negative for unusual weight change.    Physical Examination:   BP 133/60   Pulse 68   Temp (!) 96.9 F (36.1 C) (Oral)   Ht 5' 9"  (1.753 m)   Wt 201 lb 12.8 oz (91.5 kg)   BMI 29.80 kg/m   General: Well-nourished,  well-developed in no acute distress.  Eyes: No icterus. Mouth: Oropharyngeal mucosa moist and pink , no lesions erythema or exudate. Lungs: Clear to auscultation bilaterally.  Heart: Regular rate and rhythm, no murmurs rubs or gallops.  Abdomen: Bowel sounds are normal, nontender, nondistended, no hepatosplenomegaly or masses, no abdominal bruits or hernia , no rebound or guarding.   Extremities: No lower extremity edema. No clubbing or deformities. Neuro: Alert and oriented x 4   Skin: Warm and dry, no jaundice.   Psych: Alert and cooperative, normal mood and affect.  Labs:  Lab Results    Component Value Date   CREATININE 1.16 03/25/2017   BUN 20 03/25/2017   NA 139 03/25/2017   K 4.4 03/25/2017   CL 101 03/25/2017   CO2 22 03/25/2017   Lab Results  Component Value Date   ALT 30 12/11/2016   AST 50 (H) 12/11/2016   ALKPHOS 97 09/26/2016   BILITOT 1.0 12/11/2016   Lab Results  Component Value Date   WBC 4.6 12/11/2016   HGB 11.4 (L) 12/11/2016   HCT 31.9 (L) 12/11/2016   MCV 98.2 12/11/2016   PLT 133 (L) 12/11/2016   Lab Results  Component Value Date   INR 1.0 12/11/2016   INR 1.0 06/03/2016   INR 1.0 04/17/2015    Imaging Studies: US Abdomen Limited Ruq  Result Date: 04/02/2017 CLINICAL DATA:  Liver disease. EXAM: ULTRASOUND ABDOMEN LIMITED RIGHT UPPER QUADRANT COMPARISON:  MRI 09/26/2016. FINDINGS: Gallbladder: No gallstones or wall thickening visualized. No sonographic Murphy sign noted by sonographer. Common bile duct: Diameter: 5.0 mm Liver: Increase echogenicity consistent fatty infiltration and/or hepatocellular disease. No focal hepatic abnormality identified. Portal vein is patent on color Doppler imaging with normal direction of blood flow towards the liver. IMPRESSION: 1. Increased hepatic echogenicity consistent fatty infiltration and/or hepatocellular disease. No focal hepatic abnormality identified. 2.  No gallstones or biliary distention. Electronically Signed   By: Marcello Moores  Register   On: 04/02/2017 10:26

## 2017-05-01 NOTE — Progress Notes (Signed)
cc'd to pcp 

## 2017-05-08 ENCOUNTER — Telehealth: Payer: Self-pay | Admitting: Internal Medicine

## 2017-05-08 ENCOUNTER — Other Ambulatory Visit: Payer: Self-pay

## 2017-05-08 DIAGNOSIS — K760 Fatty (change of) liver, not elsewhere classified: Secondary | ICD-10-CM

## 2017-05-08 NOTE — Telephone Encounter (Signed)
Routed to CVRR - comparable dose of losartan since irbesartan 150 QD is not available at [place]?

## 2017-05-08 NOTE — Telephone Encounter (Signed)
New Message:     Pt said the [place] does not have Irbesartan,but they have Losartan.His question is can he use the Losartan in place of the Irbesartan?

## 2017-05-08 NOTE — Telephone Encounter (Signed)
Spoke with patient. Advised change from irbesartan 157m to losartan 1062mis OK and this is the recommended dose. He states [facility] and is [facility] him losartan. He was advised to check BP at home. Asked that he call [place] [facility]

## 2017-05-08 NOTE — Telephone Encounter (Signed)
Losartan tends not to be as potent, so would suggest he start with losartan 100 mg daily and monitor home BP regularly for 2-3 weeks

## 2017-05-15 ENCOUNTER — Telehealth: Payer: Self-pay | Admitting: Internal Medicine

## 2017-05-15 NOTE — Telephone Encounter (Signed)
LMTCB  See phone note 4/5 for reference of med changes

## 2017-05-15 NOTE — Telephone Encounter (Signed)
New Message:     Please call,concerning his Losartan.He thinks his dose is wrong.

## 2017-05-19 NOTE — Telephone Encounter (Signed)
Left message for pt to call.

## 2017-05-21 NOTE — Telephone Encounter (Signed)
Left message for pt to call if still has questions.

## 2017-05-28 ENCOUNTER — Other Ambulatory Visit: Payer: Self-pay | Admitting: Internal Medicine

## 2017-06-23 DIAGNOSIS — L570 Actinic keratosis: Secondary | ICD-10-CM | POA: Diagnosis not present

## 2017-07-06 DIAGNOSIS — N401 Enlarged prostate with lower urinary tract symptoms: Secondary | ICD-10-CM | POA: Diagnosis not present

## 2017-07-06 DIAGNOSIS — R351 Nocturia: Secondary | ICD-10-CM | POA: Diagnosis not present

## 2017-07-06 DIAGNOSIS — N138 Other obstructive and reflux uropathy: Secondary | ICD-10-CM | POA: Diagnosis not present

## 2017-07-06 DIAGNOSIS — N529 Male erectile dysfunction, unspecified: Secondary | ICD-10-CM | POA: Diagnosis not present

## 2017-07-07 DIAGNOSIS — K760 Fatty (change of) liver, not elsewhere classified: Secondary | ICD-10-CM | POA: Diagnosis not present

## 2017-07-08 LAB — COMPREHENSIVE METABOLIC PANEL
AG Ratio: 1.7 (calc) (ref 1.0–2.5)
ALKALINE PHOSPHATASE (APISO): 101 U/L (ref 40–115)
ALT: 24 U/L (ref 9–46)
AST: 43 U/L — ABNORMAL HIGH (ref 10–35)
Albumin: 4.3 g/dL (ref 3.6–5.1)
BUN / CREAT RATIO: 13 (calc) (ref 6–22)
BUN: 15 mg/dL (ref 7–25)
CO2: 27 mmol/L (ref 20–32)
CREATININE: 1.17 mg/dL — AB (ref 0.70–1.11)
Calcium: 9.3 mg/dL (ref 8.6–10.3)
Chloride: 104 mmol/L (ref 98–110)
GLOBULIN: 2.6 g/dL (ref 1.9–3.7)
GLUCOSE: 103 mg/dL — AB (ref 65–99)
Potassium: 4.1 mmol/L (ref 3.5–5.3)
Sodium: 138 mmol/L (ref 135–146)
Total Bilirubin: 1.4 mg/dL — ABNORMAL HIGH (ref 0.2–1.2)
Total Protein: 6.9 g/dL (ref 6.1–8.1)

## 2017-07-08 LAB — CBC WITH DIFFERENTIAL/PLATELET
BASOS PCT: 0.3 %
Basophils Absolute: 10 cells/uL (ref 0–200)
EOS ABS: 99 {cells}/uL (ref 15–500)
Eosinophils Relative: 2.9 %
HEMATOCRIT: 33.1 % — AB (ref 38.5–50.0)
HEMOGLOBIN: 11.9 g/dL — AB (ref 13.2–17.1)
LYMPHS ABS: 840 {cells}/uL — AB (ref 850–3900)
MCH: 34.8 pg — AB (ref 27.0–33.0)
MCHC: 36 g/dL (ref 32.0–36.0)
MCV: 96.8 fL (ref 80.0–100.0)
MPV: 11.8 fL (ref 7.5–12.5)
Monocytes Relative: 8.7 %
NEUTROS ABS: 2156 {cells}/uL (ref 1500–7800)
Neutrophils Relative %: 63.4 %
PLATELETS: 144 10*3/uL (ref 140–400)
RBC: 3.42 10*6/uL — AB (ref 4.20–5.80)
RDW: 11.7 % (ref 11.0–15.0)
TOTAL LYMPHOCYTE: 24.7 %
WBC: 3.4 10*3/uL — AB (ref 3.8–10.8)
WBCMIX: 296 {cells}/uL (ref 200–950)

## 2017-07-08 LAB — PROTIME-INR
INR: 1
PROTHROMBIN TIME: 10.9 s (ref 9.0–11.5)

## 2017-07-14 ENCOUNTER — Other Ambulatory Visit: Payer: Self-pay

## 2017-07-14 DIAGNOSIS — K760 Fatty (change of) liver, not elsewhere classified: Secondary | ICD-10-CM

## 2017-07-14 DIAGNOSIS — K769 Liver disease, unspecified: Secondary | ICD-10-CM

## 2017-07-14 DIAGNOSIS — K7460 Unspecified cirrhosis of liver: Secondary | ICD-10-CM

## 2017-07-14 DIAGNOSIS — K219 Gastro-esophageal reflux disease without esophagitis: Secondary | ICD-10-CM

## 2017-07-28 DIAGNOSIS — Z6830 Body mass index (BMI) 30.0-30.9, adult: Secondary | ICD-10-CM | POA: Diagnosis not present

## 2017-07-28 DIAGNOSIS — M5431 Sciatica, right side: Secondary | ICD-10-CM | POA: Diagnosis not present

## 2017-07-28 DIAGNOSIS — M545 Low back pain: Secondary | ICD-10-CM | POA: Diagnosis not present

## 2017-07-28 DIAGNOSIS — M25562 Pain in left knee: Secondary | ICD-10-CM | POA: Diagnosis not present

## 2017-07-28 DIAGNOSIS — E6609 Other obesity due to excess calories: Secondary | ICD-10-CM | POA: Diagnosis not present

## 2017-07-28 DIAGNOSIS — Z1389 Encounter for screening for other disorder: Secondary | ICD-10-CM | POA: Diagnosis not present

## 2017-08-11 ENCOUNTER — Telehealth: Payer: Self-pay | Admitting: Internal Medicine

## 2017-08-11 NOTE — Telephone Encounter (Signed)
Returned call to patient of Dr. Debara Pickett. He is concerned that his HR is running in the 50s. Per chart review, HR was 58 at last cardiology visit. He is wondering if he needs to take metoprolol tartrate. Advised he should take all of his medication as prescribed. Suggested that he monitor his BP and HR twice daily for a week or so and record, call us if he has concerns about low HR or doesn't feel well.

## 2017-08-11 NOTE — Telephone Encounter (Signed)
New Message:      Pt is calling and stating he is wanting to talk to someone regarding his medications.

## 2017-08-17 ENCOUNTER — Telehealth: Payer: Self-pay | Admitting: Internal Medicine

## 2017-08-17 NOTE — Telephone Encounter (Signed)
   [facility] Pre-operative Risk Assessment    Request for surgical clearance:  1. What type of surgery is being performed? Multiple dental extractions & lower implants  2. When is this surgery scheduled? TBD   3. What type of clearance is required (medical clearance vs. Pharmacy clearance to hold med vs. Both)? Both   4. Are there any medications that need to be held prior to surgery and how long? Eliquis - # of days unspecified    5. Practice name and name of physician performing surgery? Dr. Valentina Shaggy @ [facility] [place]   6. What is your office phone number [phone]     7.   What is your office fax number [phone]  8.   Anesthesia type (None, local, MAC, general) ? Not specified    [name] 08/17/2017, 12:53 PM  _________________________________________________________________   (provider comments below)

## 2017-08-18 NOTE — Telephone Encounter (Signed)
Pt takes Eliquis for afib with CHADS2VASc score of 4 (age x2, HTN, CAD). Ok to hold Eliquis for 1 day prior to multiple extractions.

## 2017-08-18 NOTE — Telephone Encounter (Signed)
   Primary Cardiologist: Dr Debara Pickett  Chart reviewed as part of pre-operative protocol coverage. Because of Khylin Gutridge Dimaio's past medical history and time since last visit, he/she will require a follow-up visit in order to better assess preoperative cardiovascular risk  Pre-op covering staff: - Please schedule appointment and call patient to inform them. - Please contact requesting surgeon's office via preferred method (i.e, phone, fax) to inform them of need for appointment prior to surgery.  Kerin Ransom, PA-C  08/18/2017, 2:11 PM

## 2017-08-18 NOTE — Telephone Encounter (Signed)
Spoke to patient, appt scheduled for 7/17 at 11 am with Arnold Long DNP for surgical clearance.  He states this needs to be done asap as he cannot eat right now due to dental issues.

## 2017-08-19 ENCOUNTER — Encounter: Payer: Self-pay | Admitting: Adult Health

## 2017-08-19 ENCOUNTER — Ambulatory Visit (INDEPENDENT_AMBULATORY_CARE_PROVIDER_SITE_OTHER): Payer: Medicare Other | Admitting: Adult Health

## 2017-08-19 VITALS — BP 144/62 | HR 64 | Ht 69.0 in | Wt 190.2 lb

## 2017-08-19 DIAGNOSIS — Z01818 Encounter for other preprocedural examination: Secondary | ICD-10-CM

## 2017-08-19 DIAGNOSIS — I4891 Unspecified atrial fibrillation: Secondary | ICD-10-CM

## 2017-08-19 DIAGNOSIS — E7800 Pure hypercholesterolemia, unspecified: Secondary | ICD-10-CM | POA: Diagnosis not present

## 2017-08-19 DIAGNOSIS — I2510 Atherosclerotic heart disease of native coronary artery without angina pectoris: Secondary | ICD-10-CM | POA: Diagnosis not present

## 2017-08-19 DIAGNOSIS — I10 Essential (primary) hypertension: Secondary | ICD-10-CM

## 2017-08-19 MED ORDER — HYDROCHLOROTHIAZIDE 25 MG PO TABS: 12.5000 mg | ORAL_TABLET | Freq: Every day | ORAL | 3 refills | Status: DC

## 2017-08-19 NOTE — Patient Instructions (Signed)
Medication Instructions:  HOLD ELIQUIS 5 DAYS BEFORE DENTAL PROCEDURE AND RESTART ASAP AFTER, WILL BE DIRECTED BY DENTIST If you need a refill on your cardiac medications before your next appointment, please call your pharmacy.  Special Instructions: CLEARED FOR DENTAL PROCEDURE  Follow-Up: Your physician wants you to follow-up in: [place] should receive a reminder letter in the mail two months in advance. If you do not receive a letter, please call our office NOV 2019 to schedule the JAN 2020 follow-up appointment.   Thank you for choosing [facility] at [facility]!!

## 2017-08-19 NOTE — Progress Notes (Signed)
Cardiology Office Note   Date:  08/19/2017   ID:  [name], DOB 1933/11/19, MRN [id number]  PCP:  Redmond School, MD  Cardiologist: Dr. Debara Pickett Chief Complaint  Patient presents with  . Pre-op Exam    Dental Extractions      History of Present Illness: [name] is a 84 y.o. male who presents for preoperative cardiac clearance with known history of coronary artery bypass grafting i (LIMA to diagonal, SVG to PDA, and SVG to ramus intermedius) n 2006 with normal follow-up nuclear study in 2009.    Other history includes, hypertension, PAF renal artery aneurysm carotid artery disease with recent Dopplers revealing only mild disease.  The patient sees Dr. Debara Pickett on an annual basis and is been seen last on 12/23/2016.  Per note by Dr. Debara Pickett lower extremity Dopplers and carotid Dopplers did not indicate significant insufficiency or stenosis.  Patient had had episodes of atrial fibrillation and remained on Eliquis.  A repeat lipid profile was ordered.  He is to have multiple teeth extractions with planned dental implants by Dr. Stephanie Coup with [facility] on undetermined date.   Past Medical History:  Diagnosis Date  . BPH (benign prostatic hyperplasia)   . CAD (coronary artery disease) of artery bypass graft 2006  . Fatty liver   . GERD (gastroesophageal reflux disease)   . Gilbert's syndrome   . Hiatal hernia   . History of nuclear stress test 11/2007   bruce myoview; normal pattern of persuion in all regions; post-stress EF 70%; low risk   . HTN (hypertension)   . Neuropathy   . PAF (paroxysmal atrial fibrillation) ([place])   . PVD (peripheral vascular disease) ([place])    0-49% R & L ICA stenosis (2013)   . Renal artery aneurysm ([place])   . S/P CABG (coronary artery bypass graft) 2006  . Schatzki's ring    non critical  . Thrombocytopenia ([place])     Past Surgical History:  Procedure Laterality Date  . CATARACT EXTRACTION  2011  . COLONOSCOPY  2006   Dr. Margarito Courser  pancolonic diverticula  . COLONOSCOPY  01/07/2012   Dr. Abbe Amsterdam normal rectum, pancolonic diverticulosis, hyperplastic polyp  . COLONOSCOPY N/A 03/20/2015   IOM:[id number] diverticulosis  . CORONARY ARTERY BYPASS GRAFT  04/2004   LIMA to diagonal, SVG to ramus intermedius, SVG to PDA; placed stent for crossed coronary arteries (Dr. Prescott Gum)  . ESOPHAGOGASTRODUODENOSCOPY  2006   Dr. Ruthell Rummage Schatzki's ring, s/p 58-F Maloney dilation  . ESOPHAGOGASTRODUODENOSCOPY  01/07/2012   Dr. Gareth Morgan ring, hiatal hernia, fundic gland polyp  . ESOPHAGOGASTRODUODENOSCOPY N/A 03/20/2015   GTX:MIWOE 1 varices/HH Gastric polpy s/p bx  . rotater cuff     repair  . TONSILLECTOMY       Current Outpatient Medications  Medication Sig Dispense Refill  . allopurinol (ZYLOPRIM) 300 MG tablet Take 300 mg by mouth 4 (four) times daily as needed (for gout flare up).    . ALPRAZolam (XANAX) 0.5 MG tablet Take 0.5 mg by mouth at bedtime as needed. Sleep.    Marland Kitchen amLODipine (NORVASC) 5 MG tablet Take 1 tablet (5 mg total) by mouth daily. 90 tablet 3  . apixaban (ELIQUIS) 5 MG TABS tablet Take 1 tablet (5 mg total) by mouth 2 (two) times daily. 180 tablet 3  . doxazosin (CARDURA) 8 MG tablet Take 4 mg by mouth daily.    . finasteride (PROSCAR) 5 MG tablet Take 5 mg by mouth. doesn't take daily    .  flunisolide (NASALIDE) 0.025 % SOLN Inhale 2 sprays into the lungs daily.     Marland Kitchen gabapentin (NEURONTIN) 300 MG capsule Take 300 mg by mouth 3 (three) times daily.      . hydrochlorothiazide (HYDRODIURIL) 25 MG tablet Take 0.5 tablets (12.5 mg total) by mouth daily. 45 tablet 3  . Levothyroxine Sodium 112 MCG CAPS Take 112 mcg by mouth daily before breakfast. Pt takes 161mg    . losartan (COZAAR) 50 MG tablet Take 50 mg by mouth daily.    . metoprolol tartrate (LOPRESSOR) 25 MG tablet TAKE ONE-HALF TABLET BY MOUTH TWICE DAILY 90 tablet 2  . RABEprazole (ACIPHEX) 20 MG tablet TAKE ONE TABLET BY MOUTH ONCE  DAILY BEFORE BREAKFAST 30 tablet 1  . tamsulosin (FLOMAX) 0.4 MG CAPS capsule Take 0.4 mg by mouth daily.     No current facility-administered medications for this visit.     Allergies:   Antihistamines, chlorpheniramine-type    Social History:  The patient  reports that he quit smoking about 13 years ago. His smoking use included cigarettes. He has a 40.00 pack-year smoking history. He has never used smokeless tobacco. He reports that he drinks alcohol. He reports that he does not use drugs.   Family History:  The patient's family history includes Diabetes in his brother; Heart attack (age of onset: 682 in his mother; Heart attack (age of onset: 853 in his father; Heart disease in his brother.    ROS: All other systems are reviewed and negative. Unless otherwise mentioned in H&P    PHYSICAL EXAM: VS:  BP (!) 144/62   Pulse 64   Ht 5' 9"  (1.753 m)   Wt 190 lb 3.2 oz (86.3 kg)   BMI 28.09 kg/m  , BMI Body mass index is 28.09 kg/m. GEN: Well nourished, well developed, in no acute distress  HEENT: normal  Neck: no JVD, carotid bruits, or masses Cardiac: IRRR; no murmurs, rubs, or gallops,no edema  Respiratory:  clear to auscultation bilaterally, normal work of breathing GI: soft, nontender, nondistended, + BS MS: no deformity or atrophy  Skin: warm and dry, no rash Neuro:  Strength and sensation are intact Psych: euthymic mood, full affect   EKG:  Sinus bradycardia with 1st degree AV block with PAC;s rate of 57 bpm.   Recent Labs: 07/07/2017: ALT 24; BUN 15; Creat 1.17; Hemoglobin 11.9; Platelets 144; Potassium 4.1; Sodium 138    Lipid Panel    Component Value Date/Time   CHOL 117 05/10/2014 0955   TRIG 60 05/10/2014 0955   HDL 55 05/10/2014 0955   CHOLHDL 2.4 11/07/2007 0540   VLDL 8 11/07/2007 0540   LDLCALC 50 05/10/2014 0955      Wt Readings from Last 3 Encounters:  08/19/17 190 lb 3.2 oz (86.3 kg)  04/30/17 201 lb 12.8 oz (91.5 kg)  12/23/16 199 lb 6.4 oz  (90.4 kg)      Other studies Reviewed: Echocardiogram 308-Apr-2015Left ventricle: The cavity size was normal. There was moderate concentric hypertrophy. Systolic function was normal. The estimated ejection fraction was in the range of 55% to 65%. Incoordinate septal motion. LV diastolic function cannot be assessed due to atrial fibrillation. - Aortic valve: Mildly calcified leaflets. There was no stenosis. No regurgitation. - Mitral valve: Calcified annulus. Trivial regurgitation. - Left atrium: The atrium was mildly dilated (33 ml/\m2). - Systemic veins: The IVC was not well visualized. - Pericardium, extracardiac: There was no pericardial effusion.  ASSESSMENT AND PLAN:  1. Atrial  fib: The patient's heart rate is well controlled currently. He is compliant with his medications and offers no complaints of bleeding on Eliquis.   2. Pre-Operative Cardiac Exam: He is due to have multiple dental extractions on undetermined date by Dr.Barber. He is of low cardiac risk to proceed with extractions. He is to hold Eliquis for 5 days prior to the procedure and begin ASAP afterwards.   3. Hypertension: BP is well controlled on metoprolol and HCTZ. Refills on HCTZ are provided. Creatinine on last blood draw 1.17.   4. Carotid Artery Stenosis: Minimal per recent doppler studies on 03/13/2016.    Current medicines are reviewed at length with the patient today.    Labs/ tests ordered today include: None   Phill Myron. West Pugh, ANP, AACC   08/19/2017 12:46 PM    [facility] [address], [place], [place] [address] Phone: [phone]; Fax: [phone]

## 2017-09-14 ENCOUNTER — Encounter: Payer: Self-pay | Admitting: Internal Medicine

## 2017-09-14 ENCOUNTER — Telehealth: Payer: Self-pay | Admitting: Internal Medicine

## 2017-09-14 NOTE — Telephone Encounter (Signed)
Recall letter mailed

## 2017-09-14 NOTE — Telephone Encounter (Signed)
Recall for ultrasound 

## 2017-09-24 DIAGNOSIS — D649 Anemia, unspecified: Secondary | ICD-10-CM | POA: Diagnosis not present

## 2017-09-24 DIAGNOSIS — Z0001 Encounter for general adult medical examination with abnormal findings: Secondary | ICD-10-CM | POA: Diagnosis not present

## 2017-09-24 DIAGNOSIS — I10 Essential (primary) hypertension: Secondary | ICD-10-CM | POA: Diagnosis not present

## 2017-09-24 DIAGNOSIS — Z1389 Encounter for screening for other disorder: Secondary | ICD-10-CM | POA: Diagnosis not present

## 2017-09-24 DIAGNOSIS — N400 Enlarged prostate without lower urinary tract symptoms: Secondary | ICD-10-CM | POA: Diagnosis not present

## 2017-09-24 DIAGNOSIS — J309 Allergic rhinitis, unspecified: Secondary | ICD-10-CM | POA: Diagnosis not present

## 2017-09-24 DIAGNOSIS — M5136 Other intervertebral disc degeneration, lumbar region: Secondary | ICD-10-CM | POA: Diagnosis not present

## 2017-09-24 DIAGNOSIS — F419 Anxiety disorder, unspecified: Secondary | ICD-10-CM | POA: Diagnosis not present

## 2017-09-24 DIAGNOSIS — D696 Thrombocytopenia, unspecified: Secondary | ICD-10-CM | POA: Diagnosis not present

## 2017-09-24 DIAGNOSIS — Z6828 Body mass index (BMI) 28.0-28.9, adult: Secondary | ICD-10-CM | POA: Diagnosis not present

## 2017-09-24 DIAGNOSIS — E663 Overweight: Secondary | ICD-10-CM | POA: Diagnosis not present

## 2017-10-02 ENCOUNTER — Telehealth: Payer: Self-pay | Admitting: Gastroenterology

## 2017-10-02 NOTE — Telephone Encounter (Signed)
Labs from 09/24/2017  White blood cell count 4500, hemoglobin 11, hematocrit 31.6, platelets 166,000, glucose 112, BUN 14, creatinine 1.2, total bilirubin 1.3, alkaline phosphatase 101, AST 39, ALT 25, albumin 4.4, vitamin B12 375, folate 12.5, ferritin 151, iron 100, iron saturations 31%, TIBC 3.7, TSH 1.080   Please make sure he gets ov with RMR only. Due now for OV and abd u/s. Letter sent.

## 2017-10-06 ENCOUNTER — Telehealth: Payer: Self-pay | Admitting: Internal Medicine

## 2017-10-06 ENCOUNTER — Encounter: Payer: Self-pay | Admitting: Internal Medicine

## 2017-10-06 DIAGNOSIS — K769 Liver disease, unspecified: Secondary | ICD-10-CM

## 2017-10-06 NOTE — Telephone Encounter (Signed)
[place] abd RUQ scheduled for 10/12/17 at 9:30am, arrive at 9:15am. NPO for 6 hours before test. Called and informed pt of appt. Letter mailed.

## 2017-10-06 NOTE — Telephone Encounter (Signed)
Pt said he received a letter to call and schedule his U/S. [phone]

## 2017-10-06 NOTE — Telephone Encounter (Signed)
PATIENT SCHEDULED AND LETTER SENT  °

## 2017-10-12 ENCOUNTER — Ambulatory Visit (HOSPITAL_COMMUNITY)
Admission: RE | Admit: 2017-10-12 | Discharge: 2017-10-12 | Disposition: A | Discharge status: Home / Self Care | Payer: Medicare Other | Source: Ambulatory Visit | Attending: Internal Medicine | Admitting: Internal Medicine

## 2017-10-12 DIAGNOSIS — K769 Liver disease, unspecified: Secondary | ICD-10-CM | POA: Diagnosis not present

## 2017-10-12 DIAGNOSIS — K7689 Other specified diseases of liver: Secondary | ICD-10-CM | POA: Diagnosis not present

## 2017-10-26 NOTE — Progress Notes (Signed)
CC'D TO PCP AND ON RECALL  °

## 2017-11-20 ENCOUNTER — Encounter: Payer: Self-pay | Admitting: Internal Medicine

## 2017-11-20 ENCOUNTER — Ambulatory Visit (INDEPENDENT_AMBULATORY_CARE_PROVIDER_SITE_OTHER): Payer: Medicare Other | Admitting: Internal Medicine

## 2017-11-20 VITALS — BP 132/60 | HR 72 | Temp 97.0°F | Ht 69.0 in | Wt 197.4 lb

## 2017-11-20 DIAGNOSIS — I2510 Atherosclerotic heart disease of native coronary artery without angina pectoris: Secondary | ICD-10-CM

## 2017-11-20 DIAGNOSIS — K219 Gastro-esophageal reflux disease without esophagitis: Secondary | ICD-10-CM | POA: Diagnosis not present

## 2017-11-20 DIAGNOSIS — K7469 Other cirrhosis of liver: Secondary | ICD-10-CM

## 2017-11-20 NOTE — Progress Notes (Signed)
Primary Care Physician:  Redmond School, MD Primary Gastroenterologist:  Dr. Gala Romney  Pre-Procedure History & Physical: HPI:  [name] is a 84 y.o. male here for follow-up GERD and cirrhosis (predominantly NASH and possibly a minor component of a ASH).  Patient clinically is doing very well;   he plays golf multiple times weekly.  He walks about a mile and a half daily. Cirrhotic appearing liver on ultrasound.  Grade 1 varices on 2017 EGD.  Historically a low MELD.  Platelet count actually back into the low normal range at last assay.  Patient not interested in taking a nonselective beta-blocker.  Not really keen on a follow-up EGD.  He continues to be anticoagulated secondary to atrial fibrillation.  GERD symptoms well controlled on rabeprazole 20 mg daily.  Past Medical History:  Diagnosis Date  . BPH (benign prostatic hyperplasia)   . CAD (coronary artery disease) of artery bypass graft 2006  . Fatty liver   . GERD (gastroesophageal reflux disease)   . Gilbert's syndrome   . Hiatal hernia   . History of nuclear stress test 11/2007   bruce myoview; normal pattern of persuion in all regions; post-stress EF 70%; low risk   . HTN (hypertension)   . Neuropathy   . PAF (paroxysmal atrial fibrillation) ([place])   . PVD (peripheral vascular disease) ([place])    0-49% R & L ICA stenosis (2013)   . Renal artery aneurysm ([place])   . S/P CABG (coronary artery bypass graft) 2006  . Schatzki's ring    non critical  . Thrombocytopenia ([place])     Past Surgical History:  Procedure Laterality Date  . CATARACT EXTRACTION  2011  . COLONOSCOPY  2006   Dr. Margarito Courser pancolonic diverticula  . COLONOSCOPY  01/07/2012   Dr. Abbe Amsterdam normal rectum, pancolonic diverticulosis, hyperplastic polyp  . COLONOSCOPY N/A 03/20/2015   [id number] diverticulosis  . CORONARY ARTERY BYPASS GRAFT  04/2004   LIMA to diagonal, SVG to ramus intermedius, SVG to PDA; placed stent for crossed coronary  arteries (Dr. Prescott Gum)  . ESOPHAGOGASTRODUODENOSCOPY  2006   Dr. Ruthell Rummage Schatzki's ring, s/p 58-F Maloney dilation  . ESOPHAGOGASTRODUODENOSCOPY  01/07/2012   Dr. Gareth Morgan ring, hiatal hernia, fundic gland polyp  . ESOPHAGOGASTRODUODENOSCOPY N/A 03/20/2015   OIZ:TIWPY 1 varices/HH Gastric polpy s/p bx  . rotater cuff     repair  . TONSILLECTOMY      Prior to Admission medications   Medication Sig Start Date End Date Taking? Authorizing Provider  allopurinol (ZYLOPRIM) 300 MG tablet Take 300 mg by mouth 4 (four) times daily as needed (for gout flare up).   Yes [provider]  ALPRAZolam Duanne Moron) 0.5 MG tablet Take 0.5 mg by mouth at bedtime as needed. Sleep.   Yes [provider]  amLODipine (NORVASC) 5 MG tablet Take 1 tablet (5 mg total) by mouth daily. 03/12/17  Yes Hilty, Nadean Corwin, MD  apixaban (ELIQUIS) 5 MG TABS tablet Take 1 tablet (5 mg total) by mouth 2 (two) times daily. 01/24/16  Yes Hilty, Nadean Corwin, MD  doxazosin (CARDURA) 8 MG tablet Take 4 mg by mouth daily.   Yes [provider]  finasteride (PROSCAR) 5 MG tablet Take 5 mg by mouth. doesn't take daily   Yes [provider]  flunisolide (NASALIDE) 0.025 % SOLN Inhale 2 sprays into the lungs daily.    Yes [provider]  gabapentin (NEURONTIN) 300 MG capsule Take 300 mg by mouth 3 (three) times  daily.     Yes [provider]  hydrochlorothiazide (HYDRODIURIL) 25 MG tablet Take 0.5 tablets (12.5 mg total) by mouth daily. 08/19/17  Yes Lendon Colonel, NP  Levothyroxine Sodium 112 MCG CAPS Take 112 mcg by mouth daily before breakfast. Pt takes 155mg   Yes [provider]  losartan (COZAAR) 50 MG tablet Take 50 mg by mouth daily.   Yes [provider]  metoprolol tartrate (LOPRESSOR) 25 MG tablet TAKE ONE-HALF TABLET BY MOUTH TWICE DAILY 02/26/17  Yes Hilty, KNadean Corwin MD  RABEprazole (ACIPHEX) 20 MG tablet TAKE ONE TABLET BY MOUTH ONCE  DAILY BEFORE BREAKFAST 07/07/16  Yes BAnnitta Needs NP  tamsulosin (FLOMAX) 0.4 MG CAPS capsule Take 0.4 mg by mouth daily.   Yes [provider]    Allergies as of 11/20/2017 - Review Complete 11/20/2017  Allergen Reaction Noted  . Antihistamines, chlorpheniramine-type Rash 08/26/2011    Family History  Problem Relation Age of Onset  . Heart attack Mother 614      deceased  . Heart attack Father 834      deceased  . Heart disease Brother        x2  . Diabetes Brother        x3  . Colon cancer Neg Hx     Social History   Socioeconomic History  . Marital status: Married    Spouse name: Not on file  . Number of children: Not on file  . Years of education: Not on file  . Highest education level: Not on file  Occupational History  . Not on file  Social Needs  . Financial resource strain: Not on file  . Food insecurity:    Worry: Not on file    Inability: Not on file  . Transportation needs:    Medical: Not on file    Non-medical: Not on file  Tobacco Use  . Smoking status: Former Smoker    Packs/day: 1.00    Years: 40.00    Pack years: 40.00    Types: Cigarettes    Last attempt to quit: 04/07/2004    Years since quitting: 13.6  . Smokeless tobacco: Never Used  Substance and Sexual Activity  . Alcohol use: Yes    Comment: occassional drinker  . Drug use: No  . Sexual activity: Never  Lifestyle  . Physical activity:    Days per week: Not on file    Minutes per session: Not on file  . Stress: Not on file  Relationships  . Social connections:    Talks on phone: Not on file    Gets together: Not on file    Attends religious service: Not on file    Active member of club or organization: Not on file    Attends meetings of clubs or organizations: Not on file    Relationship status: Not on file  . Intimate partner violence:    Fear of current or ex partner: Not on file    Emotionally abused: Not on file    Physically abused: Not on file    Forced sexual  activity: Not on file  Other Topics Concern  . Not on file  Social History Narrative  . Not on file    Review of Systems: See HPI, otherwise negative ROS  Physical Exam: BP 132/60   Pulse 72   Temp (!) 97 F (36.1 C) (Oral)   Ht 5' 9"  (1.753 m)   Wt 197 lb 6.4  oz (89.5 kg)   BMI 29.15 kg/m  General:   Alert,  pleasant and cooperative in NAD Heart:  Regular rate and rhythm; no murmurs, clicks, rubs,  or gallops. Abdomen: Non-distended, normal bowel sounds.  Soft and nontender without appreciable mass or hepatosplenomegaly.  Pulses:  Normal pulses noted. Extremities:  Without clubbing or edema.  Impression/Plan: Pleasant 84 year old gentleman who likely has cirrhosis related to [facility] and perhaps a component of a rash from distant alcohol exposure.  Clinically, doing very well.  I feel he has excellent preserved hepatic synthetic function.  He is really not interested in much in the way of cirrhosis care.  Had a lengthy discussion about what is recommended.  He says given his other comorbidities including cardiac disease and his advanced age he would rather keep his life simple moving forward.  GERD well-controlled on rabeprazole  Recommendations:  Continue Rabeprazole 20 mg daily  GERD information  Office visit in 8 months  May or may not look in your esophagus one more time next year; we will discuss at your next visit     Notice: This dictation was prepared with Dragon dictation along with smaller phrase technology. Any transcriptional errors that result from this process are unintentional and may not be corrected upon review.

## 2017-11-20 NOTE — Patient Instructions (Signed)
Continue Rabeprazole 20 mg daily  GERD information  Office visit in 8 months  May or may not look in your esophagus one more time next year; we will discuss at your next visit

## 2017-12-03 ENCOUNTER — Telehealth: Payer: Self-pay | Admitting: Internal Medicine

## 2017-12-03 NOTE — Telephone Encounter (Signed)
PATIENT ON RECALL FOR LABS

## 2017-12-04 NOTE — Telephone Encounter (Signed)
Noted. Nurse doing December labs will see lab orders in box.

## 2017-12-17 ENCOUNTER — Other Ambulatory Visit: Payer: Self-pay

## 2017-12-17 DIAGNOSIS — K769 Liver disease, unspecified: Secondary | ICD-10-CM

## 2017-12-17 DIAGNOSIS — K7460 Unspecified cirrhosis of liver: Secondary | ICD-10-CM

## 2017-12-17 DIAGNOSIS — K760 Fatty (change of) liver, not elsewhere classified: Secondary | ICD-10-CM

## 2017-12-21 DIAGNOSIS — L570 Actinic keratosis: Secondary | ICD-10-CM | POA: Diagnosis not present

## 2017-12-24 DIAGNOSIS — Z23 Encounter for immunization: Secondary | ICD-10-CM | POA: Diagnosis not present

## 2017-12-29 ENCOUNTER — Ambulatory Visit (INDEPENDENT_AMBULATORY_CARE_PROVIDER_SITE_OTHER): Payer: Medicare Other | Admitting: Internal Medicine

## 2017-12-29 ENCOUNTER — Encounter: Payer: Self-pay | Admitting: Internal Medicine

## 2017-12-29 VITALS — BP 136/64 | HR 61 | Ht 69.0 in | Wt 197.0 lb

## 2017-12-29 DIAGNOSIS — I10 Essential (primary) hypertension: Secondary | ICD-10-CM | POA: Diagnosis not present

## 2017-12-29 DIAGNOSIS — R0989 Other specified symptoms and signs involving the circulatory and respiratory systems: Secondary | ICD-10-CM | POA: Diagnosis not present

## 2017-12-29 DIAGNOSIS — I2510 Atherosclerotic heart disease of native coronary artery without angina pectoris: Secondary | ICD-10-CM

## 2017-12-29 DIAGNOSIS — E7800 Pure hypercholesterolemia, unspecified: Secondary | ICD-10-CM

## 2017-12-29 DIAGNOSIS — Z951 Presence of aortocoronary bypass graft: Secondary | ICD-10-CM

## 2017-12-29 NOTE — Patient Instructions (Signed)
Medication Instructions:  Continue current medications If you need a refill on your cardiac medications before your next appointment, please call your pharmacy.   Lab work: NONE  Testing/Procedures: NONE  Follow-Up: At [facility], you and your health needs are our priority.  As part of our continuing mission to provide you with exceptional heart care, we have created designated Provider Care Teams.  These Care Teams include your primary Cardiologist (physician) and Advanced Practice Providers (APPs -  Physician Assistants and Nurse Practitioners) who all work together to provide you with the care you need, when you need it. You will need a follow up appointment in 12 .  Please call our office 2 months in advance to schedule this appointment.  You may see Dr. Debara Pickett or one of the following Advanced Practice Providers on your designated Care Team: Almyra Deforest, [place] . Fabian Sharp, PA-C  Any Other Special Instructions Will Be Listed Below (If Applicable).

## 2017-12-29 NOTE — Progress Notes (Signed)
OFFICE NOTE  Chief Complaint:  No complaints  Primary Care Physician: Redmond School, MD  HPI:  [name] is a 84 year old gentleman who had bypass in 2006, a normal nuclear study in 2009 and has done well without any episodes of angina. He has remained active and does exercise several times a week and has had no worsening shortness of breath, palpitations, presyncope or syncopal symptoms. He also has a history of right carotid disease which he was told to be about 60% at the time of bypass, however, recent Dopplers show only mild disease. He does have a very faint bruit. He last saw Tarri Fuller, PA-C, in the office this past summer. He was having problems with elevated blood pressures at night. It was recommended that he change his blood pressure medications around, however he did not make that change.  He was recently admitted to [facility] for dehydration in the setting of pneumonia or viral influenza. He was taking his diuretics in addition to ongoing fluid losses. This cause orthostatic hypotension. His diuretic was held and his lisinopril was stopped due to worsening renal function. He was switched to amlodipine for better blood pressure control, but has not been taking the medicine due to confusion of his medicines. Currently reports his blood pressures have improved and is not have any significant swelling. In fact he is now hypertensive again. He is recently switched from warfarin over to Xarelto for better control of anticoagulation for his A. fib. He seems to be able to get the medication now with out undue cost.  [name] turns today for followup. He occasionally gets some lightheadedness. He's noted some blood pressures up into the 190s at home. He says however he feels bad when his blood pressure is more in the 120s to 130s.  I saw [name] back today in the office. Overall he reports doing fairly well. He decreased his exercise somewhat due to his wife being sick but is  interested in getting back into exercise routine. His last stress test was 7 years ago. His bypass was now 10 years ago. He denies any chest pain or worsening shortness of breath. Blood pressure is running somewhat high. His diuretics were stopped due to worsening renal function which seems to have normalized.  [name] returns today for follow-up. His main complaint has to do with pain in his back and decreased function in his legs. He says it's very difficult for him to get around. Although he has pain in his legs his symptoms are not necessarily consistent with claudication. He does have a history of decreased ABIs in the past and has not had lower extremity arterial Dopplers in some time. I was also recently reminded that he does have a history of renal artery aneurysm which was noted on the CT scan. This has not been reassessed in several years. He denies any significant symptoms with atrial fibrillation has had no bleeding problems on Savaysa.  01/04/16  [name] was seen today in follow-up. He denies any worsening chest pain or shortness of breath. He continues to have some tightness in his legs. He had normal ABIs by Dopplers last year however will need a repeat of that. His renal Dopplers also failed to indicate any stenosis. He is not in atrial fibrillation today and is concerned about the cost of Savaysa. He is interested in switching NOACs. He also brought lab work from the [place] today which showed adequate thyroid levels, elevated uric acid at 8.1  and LDL of 27 with total cholesterol of 115. He is not on statins.  12/23/2016  [name] returns today for follow-up.  Over the past year he has done well.  He denies any worsening shortness of breath or chest pain.  He had lower extremity Dopplers and carotid Dopplers this past year which indicate no significant insufficiency or stenosis.  He denies any recurrent atrial fibrillation.  He switched from [facility] over to Eliquis.  He has not had any  recent lab work.  He tells me his last lipid profile which I reviewed from the [place] showed his total cholesterol of 115.  He has not been on statin therapy due to this.  Fortunately, he has not had any recurrent coronary disease.  He has had no progressive peripheral arterial disease as well.  12/29/2017  [name] is seen today in annual follow-up.  Overall he is doing well denies any chest pain or worsening shortness of breath.  Carotid Doppler showed no significant carotid stenosis.  He does complain of some neck pain which is likely arthritis.  He has been tolerating Eliquis without bleeding problems.  He has had improvement in swelling on low-dose thiazide.  Cholesterol recently well controlled with total 189, triglycerides 65, HDL 56 and LDL 40.  PMHx:  Past Medical History:  Diagnosis Date  . BPH (benign prostatic hyperplasia)   . CAD (coronary artery disease) of artery bypass graft 2006  . Fatty liver   . GERD (gastroesophageal reflux disease)   . Gilbert's syndrome   . Hiatal hernia   . History of nuclear stress test 11/2007   bruce myoview; normal pattern of persuion in all regions; post-stress EF 70%; low risk   . HTN (hypertension)   . Neuropathy   . PAF (paroxysmal atrial fibrillation) ([place])   . PVD (peripheral vascular disease) ([place])    0-49% R & L ICA stenosis (2013)   . Renal artery aneurysm ([place])   . S/P CABG (coronary artery bypass graft) 2006  . Schatzki's ring    non critical  . Thrombocytopenia ([place])     Past Surgical History:  Procedure Laterality Date  . CATARACT EXTRACTION  2011  . COLONOSCOPY  2006   Dr. Margarito Courser pancolonic diverticula  . COLONOSCOPY  01/07/2012   Dr. Abbe Amsterdam normal rectum, pancolonic diverticulosis, hyperplastic polyp  . COLONOSCOPY N/A 03/20/2015   [id number] diverticulosis  . CORONARY ARTERY BYPASS GRAFT  04/2004   LIMA to diagonal, SVG to ramus intermedius, SVG to PDA; placed stent for crossed coronary arteries (Dr. Prescott Gum)  . ESOPHAGOGASTRODUODENOSCOPY  2006   Dr. Ruthell Rummage Schatzki's ring, s/p 58-F Maloney dilation  . ESOPHAGOGASTRODUODENOSCOPY  01/07/2012   Dr. Gareth Morgan ring, hiatal hernia, fundic gland polyp  . ESOPHAGOGASTRODUODENOSCOPY N/A 03/20/2015   CLE:XNTZG 1 varices/HH Gastric polpy s/p bx  . rotater cuff     repair  . TONSILLECTOMY      FAMHx:  Family History  Problem Relation Age of Onset  . Heart attack Mother 81       deceased  . Heart attack Father 82       deceased  . Heart disease Brother        x2  . Diabetes Brother        x3  . Colon cancer Neg Hx     SOCHx:   reports that he quit smoking about 13 years ago. His smoking use included cigarettes. He has a 40.00 pack-year smoking history. He has never used smokeless  tobacco. He reports that he drinks alcohol. He reports that he does not use drugs.  ALLERGIES:  Allergies  Allergen Reactions  . Antihistamines, Chlorpheniramine-Type Rash    Patient states he cant take large doses of antihistamines    ROS: Pertinent items noted in HPI and remainder of comprehensive ROS otherwise negative.  HOME MEDS: Current Outpatient Medications  Medication Sig Dispense Refill  . allopurinol (ZYLOPRIM) 100 MG tablet Take 100 mg by mouth daily.    Marland Kitchen ALPRAZolam (XANAX) 0.5 MG tablet Take 0.5 mg by mouth at bedtime as needed. Sleep.    Marland Kitchen amLODipine (NORVASC) 5 MG tablet Take 1 tablet (5 mg total) by mouth daily. 90 tablet 3  . apixaban (ELIQUIS) 5 MG TABS tablet Take 1 tablet (5 mg total) by mouth 2 (two) times daily. 180 tablet 3  . doxazosin (CARDURA) 8 MG tablet Take 4 mg by mouth daily.    . finasteride (PROSCAR) 5 MG tablet Take 5 mg by mouth. doesn't take daily    . flunisolide (NASALIDE) 0.025 % SOLN Inhale 2 sprays into the lungs daily.     Marland Kitchen gabapentin (NEURONTIN) 300 MG capsule Take 300 mg by mouth 3 (three) times daily.      . hydrochlorothiazide (HYDRODIURIL) 25 MG tablet Take 0.5 tablets (12.5 mg total)  by mouth daily. 45 tablet 3  . Levothyroxine Sodium 112 MCG CAPS Take 112 mcg by mouth daily before breakfast. Pt takes 185mg    . losartan (COZAAR) 50 MG tablet Take 50 mg by mouth daily.    . metoprolol tartrate (LOPRESSOR) 25 MG tablet TAKE ONE-HALF TABLET BY MOUTH TWICE DAILY 90 tablet 2  . RABEprazole (ACIPHEX) 20 MG tablet TAKE ONE TABLET BY MOUTH ONCE DAILY BEFORE BREAKFAST 30 tablet 1  . tamsulosin (FLOMAX) 0.4 MG CAPS capsule Take 0.4 mg by mouth daily.     No current facility-administered medications for this visit.     LABS/IMAGING: No results found for this or any previous visit (from the past 48 hour(s)). No results found.  VITALS: BP 136/64   Pulse 61   Ht 5' 9"  (1.753 m)   Wt 197 lb (89.4 kg)   BMI 29.09 kg/m   EXAM: General appearance: alert and no distress Neck: no carotid bruit and no JVD Lungs: clear to auscultation bilaterally Heart: regular rate and rhythm, S1, S2 normal, no murmur, click, rub or gallop Abdomen: soft, non-tender; bowel sounds normal; no masses,  no organomegaly Extremities: extremities normal, atraumatic, no cyanosis or edema Pulses: 2+ and symmetric Skin: Skin color, texture, turgor normal. No rashes or lesions Neurologic: Grossly normal Psych: Mood, affect normal  EKG: Sinus rhythm first-degree AV block and PACs at 61-personally reviewed  ASSESSMENT: 1. Coronary artery disease status post three-vessel CABG in 2006 (LIMA to diagonal, SVG to PDA and SVG to ramus intermedius) 2. Negative Myoview stress test in 2016  3. Hypertension 4. Mild carotid artery disease 5. Mild dyspnea with marked exertion 6. PAF-on Eliquis 7. Leg pain and weakness with walking 8. Renal Artery aneurysm  PLAN: 1.   Mr. PSudanocontinues to do well is asymptomatic.  Denies any chest pain or shortness of breath.  He has mild carotid disease with cholesterol is well treated LDL less than 70.  He is in sinus rhythm today but has a history of PAF on Eliquis.  No  changes made to his medicines today.  Plan follow-up with me annually or sooner as necessary.  KPixie Casino MD, FSt [facility] FACP  [place] Director of the [facility] Lipid [facility]  [facility] Attending Cardiologist  Direct Dial: [phone]  Fax: [phone]  Website:  [email or web address] 12/29/2017, 10:42 AM

## 2017-12-30 ENCOUNTER — Telehealth: Payer: Self-pay | Admitting: Internal Medicine

## 2017-12-30 NOTE — Telephone Encounter (Signed)
LSL, pt was asking for a refill of Naproxen. He got it filled a few years ago. Note is in our system around 04/2015.

## 2017-12-30 NOTE — Telephone Encounter (Signed)
Pt said we have filled this for him before and needed a refill of Naproxen 500 mg and he uses [facility] in [place]

## 2017-12-30 NOTE — Telephone Encounter (Signed)
Noted. Pt notified and will speak to his pcp.

## 2017-12-30 NOTE — Telephone Encounter (Signed)
He needs to request non-GI meds from PCP however, I would not recommend use of naproxyn in setting of cirrhosis/blood thinners. He is at risk of GI bleeding in this scenario.

## 2018-01-07 ENCOUNTER — Telehealth: Payer: Self-pay | Admitting: Internal Medicine

## 2018-01-07 MED ORDER — METOPROLOL TARTRATE 25 MG PO TABS: 12.5000 mg | ORAL_TABLET | Freq: Two times a day (BID) | ORAL | 3 refills | Status: DC

## 2018-01-07 NOTE — Telephone Encounter (Signed)
Spoke with pt who states he HR has been running around 122 for the last couple of days. He reports he took a whole tablet 25 mg metoprolol last night and this morning but the medication is expired. Pt report BP has been WNL and denies any symptoms. New prescription sent in for pt and Per DOD Dr. Oval Linsey, Pt is to take a whole tablet 25 mg BID for the next for days, monitor HR/BP and if he doesn't come down, to contact office to schedule an appointment. Pt verbalized understanding.

## 2018-01-07 NOTE — Telephone Encounter (Signed)
Pt c/o BP issue:  1. What are your last 5 BP readings? Pulse 122 2. Are you having any other symptoms (ex. Dizziness, headache, blurred vision, passed out)? Light headed 3. What is your medication issue? Not sure

## 2018-01-09 ENCOUNTER — Encounter (HOSPITAL_COMMUNITY): Payer: Self-pay | Admitting: *Deleted

## 2018-01-09 ENCOUNTER — Emergency Department (HOSPITAL_COMMUNITY)
Admission: EM | Admit: 2018-01-09 | Discharge: 2018-01-09 | Disposition: A | Discharge status: Home / Self Care | Payer: Medicare Other | Attending: Emergency Medicine | Admitting: Emergency Medicine

## 2018-01-09 ENCOUNTER — Other Ambulatory Visit: Payer: Self-pay

## 2018-01-09 DIAGNOSIS — I2510 Atherosclerotic heart disease of native coronary artery without angina pectoris: Secondary | ICD-10-CM | POA: Insufficient documentation

## 2018-01-09 DIAGNOSIS — R002 Palpitations: Secondary | ICD-10-CM | POA: Diagnosis not present

## 2018-01-09 DIAGNOSIS — Z79899 Other long term (current) drug therapy: Secondary | ICD-10-CM | POA: Insufficient documentation

## 2018-01-09 DIAGNOSIS — Z87891 Personal history of nicotine dependence: Secondary | ICD-10-CM | POA: Insufficient documentation

## 2018-01-09 DIAGNOSIS — Z7901 Long term (current) use of anticoagulants: Secondary | ICD-10-CM | POA: Diagnosis not present

## 2018-01-09 DIAGNOSIS — I10 Essential (primary) hypertension: Secondary | ICD-10-CM | POA: Insufficient documentation

## 2018-01-09 DIAGNOSIS — I4891 Unspecified atrial fibrillation: Secondary | ICD-10-CM | POA: Diagnosis not present

## 2018-01-09 LAB — BASIC METABOLIC PANEL
Anion gap: 9 (ref 5–15)
BUN: 16 mg/dL (ref 8–23)
CO2: 22 mmol/L (ref 22–32)
Calcium: 9.1 mg/dL (ref 8.9–10.3)
Chloride: 105 mmol/L (ref 98–111)
Creatinine, Ser: 1.2 mg/dL (ref 0.61–1.24)
GFR calc Af Amer: >60 mL/min (ref >60)
GFR, EST NON AFRICAN AMERICAN: 56 mL/min — AB (ref >60)
GLUCOSE: 111 mg/dL — AB (ref 70–99)
Potassium: 3.8 mmol/L (ref 3.5–5.1)
Sodium: 136 mmol/L (ref 135–145)

## 2018-01-09 LAB — CBC
HCT: 34.7 % — ABNORMAL LOW (ref 39.0–52.0)
Hemoglobin: 11.9 g/dL — ABNORMAL LOW (ref 13.0–17.0)
MCH: 34.4 pg — ABNORMAL HIGH (ref 26.0–34.0)
MCHC: 34.3 g/dL (ref 30.0–36.0)
MCV: 100.3 fL — ABNORMAL HIGH (ref 80.0–100.0)
Platelets: 147 10*3/uL — ABNORMAL LOW (ref 150–400)
RBC: 3.46 MIL/uL — ABNORMAL LOW (ref 4.22–5.81)
RDW: 11.6 % (ref 11.5–15.5)
WBC: 4.3 10*3/uL (ref 4.0–10.5)
nRBC: 0 % (ref 0.0–0.2)

## 2018-01-09 MED ORDER — SODIUM CHLORIDE 0.9 % IV SOLN
INTRAVENOUS | Status: DC
  Administered 2018-01-09: 18:00:00 via INTRAVENOUS

## 2018-01-09 MED ORDER — METOPROLOL TARTRATE 5 MG/5ML IV SOLN
5.0000 mg | Freq: Once | INTRAVENOUS | Status: AC
  Administered 2018-01-09: 5 mg via INTRAVENOUS
  Filled 2018-01-09: qty 5

## 2018-01-09 NOTE — ED Triage Notes (Signed)
States he has a history of AFIB and has been out of medication for some time

## 2018-01-09 NOTE — Discharge Instructions (Addendum)
Continue taking your medications as directed.  Make sure you take your Eliquis.  Make sure you take your metoprolol.  Contact your cardiologist on Monday go ahead and keep a log of your blood pressures and your heart rate.  You may need some change in your medications.  Return for a rapid heart rate in the 140s or 50s lasting 40 minutes or longer.  Return for any new or worse symptoms.

## 2018-01-09 NOTE — ED Provider Notes (Signed)
[facility] EMERGENCY DEPARTMENT Provider Note   CSN: [id number] Arrival date & time: 01/09/18  1545     History   Chief Complaint Chief Complaint  Patient presents with  . Atrial Fibrillation    HPI [name] is a 84 y.o. male.  Patient with known history of atrial fibrillation.  Patient is on Eliquis.  Patient also on Lopressor.  Sounds as if he had not been taking his Lopressor for the last few days.  Claims he did take a dose this morning.  Patient states his heart rates been irregular.  But never been over 120.  No chest pain no shortness of breath.  Patient contacted his cardiologist about this a few days ago.  Gave him the numbers they were not overly concerned.  But patient is worried.  Also seems to be some confusion about his medication list.  We will have the pharmacy tech review it.     Past Medical History:  Diagnosis Date  . BPH (benign prostatic hyperplasia)   . CAD (coronary artery disease) of artery bypass graft 2006  . Fatty liver   . GERD (gastroesophageal reflux disease)   . Gilbert's syndrome   . Hiatal hernia   . History of nuclear stress test 11/2007   bruce myoview; normal pattern of persuion in all regions; post-stress EF 70%; low risk   . HTN (hypertension)   . Neuropathy   . PAF (paroxysmal atrial fibrillation) ([place])   . PVD (peripheral vascular disease) ([place])    0-49% R & L ICA stenosis (2013)   . Renal artery aneurysm ([place])   . S/P CABG (coronary artery bypass graft) 2006  . Schatzki's ring    non critical  . Thrombocytopenia [facility])     Patient Active Problem List   Diagnosis Date Noted  . Screening for lipid disorders 12/23/2016  . Claudication ([place]) 01/04/2016  . Bilateral carotid bruits 01/04/2016  . Esophageal varices without bleeding ([place]) 04/17/2015  . Liver disease   . Diverticulosis of colon without hemorrhage   . Gastric polyp   . Secondary esophageal varices without bleeding ([place])   . Esophageal dysphagia 02/13/2015    . Heme positive stool 02/13/2015  . Hepatosplenomegaly 02/13/2015  . Anemia 02/13/2015  . Renal artery aneurysm ([place]) 11/26/2014  . Fatty liver 10/03/2014  . Splenomegaly 10/03/2014  . Postural hypotension 04/15/2013  . Weakness generalized 04/15/2013  . Acute kidney injury ([place]) 04/15/2013  . Hypotension 04/15/2013  . Atrial fibrillation ([place]) 04/15/2013  . Hypotension, postural 04/15/2013  . CAD (coronary artery disease) 01/07/2013  . S/P CABG x 3 01/07/2013  . HTN (hypertension) 01/07/2013  . Carotid stenosis 01/07/2013  . Thrombocytopenia ([place]) 10/20/2012  . Bloating symptom 12/12/2011  . Nausea 05/08/2010  . GERD (gastroesophageal reflux disease) 05/08/2010  . Diarrhea 05/08/2010    Past Surgical History:  Procedure Laterality Date  . CATARACT EXTRACTION  2011  . COLONOSCOPY  2006   Dr. Margarito Courser pancolonic diverticula  . COLONOSCOPY  01/07/2012   Dr. Abbe Amsterdam normal rectum, pancolonic diverticulosis, hyperplastic polyp  . COLONOSCOPY N/A 03/20/2015   [id number] diverticulosis  . CORONARY ARTERY BYPASS GRAFT  04/2004   LIMA to diagonal, SVG to ramus intermedius, SVG to PDA; placed stent for crossed coronary arteries (Dr. Prescott Gum)  . ESOPHAGOGASTRODUODENOSCOPY  2006   Dr. Ruthell Rummage Schatzki's ring, s/p 58-F Maloney dilation  . ESOPHAGOGASTRODUODENOSCOPY  01/07/2012   Dr. Gareth Morgan ring, hiatal hernia, fundic gland polyp  . ESOPHAGOGASTRODUODENOSCOPY N/A 03/20/2015   KVQ:[id number]  1 varices/HH Gastric polpy s/p bx  . rotater cuff     repair  . TONSILLECTOMY          Home Medications    Prior to Admission medications   Medication Sig Start Date End Date Taking? Authorizing Provider  albuterol (PROVENTIL HFA;VENTOLIN HFA) 108 (90 Base) MCG/ACT inhaler Inhale into the lungs every 6 (six) hours as needed for wheezing or shortness of breath.   Yes [provider]  allopurinol (ZYLOPRIM) 100 MG tablet Take 100 mg by mouth at  bedtime.    Yes [provider]  amLODipine (NORVASC) 5 MG tablet Take 1 tablet (5 mg total) by mouth daily. Patient taking differently: Take 5 mg by mouth every morning.  03/12/17  Yes Hilty, Nadean Corwin, MD  apixaban (ELIQUIS) 5 MG TABS tablet Take 1 tablet (5 mg total) by mouth 2 (two) times daily. 01/24/16  Yes Hilty, Nadean Corwin, MD  doxazosin (CARDURA) 8 MG tablet Take 8 mg by mouth at bedtime.    Yes [provider]  finasteride (PROSCAR) 5 MG tablet Take 5 mg by mouth at bedtime. doesn't take daily   Yes [provider]  flunisolide (NASALIDE) 0.025 % SOLN Inhale 2 sprays into the lungs daily as needed (FOR CONGESTION).    Yes [provider]  gabapentin (NEURONTIN) 300 MG capsule Take 300 mg by mouth 3 (three) times daily.     Yes [provider]  hydrochlorothiazide (HYDRODIURIL) 25 MG tablet Take 0.5 tablets (12.5 mg total) by mouth daily. 08/19/17  Yes Lendon Colonel, NP  Levothyroxine Sodium 112 MCG CAPS Take 112 mcg by mouth daily before breakfast.    Yes [provider]  losartan (COZAAR) 50 MG tablet Take 50 mg by mouth daily.   Yes [provider]  metoprolol tartrate (LOPRESSOR) 25 MG tablet Take 0.5 tablets (12.5 mg total) by mouth 2 (two) times daily. 01/07/18  Yes Hilty, Nadean Corwin, MD  RABEprazole (ACIPHEX) 20 MG tablet TAKE ONE TABLET BY MOUTH ONCE DAILY BEFORE BREAKFAST Patient taking differently: Take 20 mg by mouth every morning.  07/07/16  Yes Annitta Needs, NP  tamsulosin (FLOMAX) 0.4 MG CAPS capsule Take 0.4 mg by mouth at bedtime.    Yes [provider]  triamcinolone cream (KENALOG) 0.1 % Apply 1 application topically 2 (two) times daily as needed (FOR ITCHING/).   Yes [provider]    Family History Family History  Problem Relation Age of Onset  . Heart attack Mother 28       deceased  . Heart attack Father 74       deceased  . Heart disease Brother        x2  . Diabetes Brother         x3  . Colon cancer Neg Hx     Social History Social History   Tobacco Use  . Smoking status: Former Smoker    Packs/day: 1.00    Years: 40.00    Pack years: 40.00    Types: Cigarettes    Last attempt to quit: 04/07/2004    Years since quitting: 13.7  . Smokeless tobacco: Never Used  Substance Use Topics  . Alcohol use: Yes    Comment: occassional drinker  . Drug use: No     Allergies   Antihistamines, chlorpheniramine-type   Review of Systems Review of Systems  Constitutional: Negative for fever.  HENT: Negative for congestion.   Eyes: Negative for visual disturbance.  Respiratory:  Negative for shortness of breath.   Cardiovascular: Positive for palpitations. Negative for chest pain and leg swelling.  Gastrointestinal: Negative for abdominal pain.  Genitourinary: Negative for dysuria.  Musculoskeletal: Negative for back pain.  Skin: Negative for rash.  Neurological: Negative for syncope and headaches.  Hematological: Does not bruise/bleed easily.  Psychiatric/Behavioral: Negative for confusion.     Physical Exam Updated Vital Signs BP (!) 151/86   Pulse (!) 49   Temp 97.8 F (36.6 C)   Resp (!) 24   Ht 1.753 m (5' 9" )   Wt 90.7 kg   SpO2 94%   BMI 29.53 kg/m   Physical Exam  Constitutional: He is oriented to person, place, and time. He appears well-developed and well-nourished. No distress.  HENT:  Head: Normocephalic and atraumatic.  Mouth/Throat: Oropharynx is clear and moist.  Eyes: Pupils are equal, round, and reactive to light. Conjunctivae and EOM are normal.  Neck: Neck supple.  Cardiovascular: Normal rate.  Heart rate for the most part around 90.  Irregular.  Pulmonary/Chest: Effort normal and breath sounds normal. No respiratory distress. He has no wheezes. He has no rales.  Abdominal: Soft. Bowel sounds are normal. There is no tenderness.  Musculoskeletal: Normal range of motion. He exhibits no edema.  Neurological: He is alert and  oriented to person, place, and time. No cranial nerve deficit or sensory deficit. He exhibits normal muscle tone. Coordination normal.  Skin: Skin is warm.  Nursing note and vitals reviewed.    ED Treatments / Results  Labs (all labs ordered are listed, but only abnormal results are displayed) Labs Reviewed  CBC - Abnormal; Notable for the following components:      Result Value   RBC 3.46 (*)    Hemoglobin 11.9 (*)    HCT 34.7 (*)    MCV 100.3 (*)    MCH 34.4 (*)    Platelets 147 (*)    All other components within normal limits  BASIC METABOLIC PANEL - Abnormal; Notable for the following components:   Glucose, Bld 111 (*)    GFR calc non Af Amer 56 (*)    All other components within normal limits    EKG EKG Interpretation  Date/Time:  Saturday January 09 2018 16:49:36 EST Ventricular Rate:  90 PR Interval:    QRS Duration: 120 QT Interval:  365 QTC Calculation: 447 R Axis:   -38 Text Interpretation:  Atrial fibrillation Nonspecific IVCD with LAD Probable lateral infarct, age indeterminate Confirmed by Fredia Sorrow [phone]) on 01/09/2018 4:52:30 PM   Radiology No results found.  Procedures Procedures (including critical care time)  Medications Ordered in ED Medications  0.9 %  sodium chloride infusion ( Intravenous New Bag/Given 01/09/18 1806)  metoprolol tartrate (LOPRESSOR) injection 5 mg (5 mg Intravenous Given 01/09/18 1824)  metoprolol tartrate (LOPRESSOR) injection 5 mg (5 mg Intravenous Given 01/09/18 1930)     Initial Impression / Assessment and Plan / ED Course  I have reviewed the triage vital signs and the nursing notes.  Pertinent labs & imaging results that were available during my care of the patient were reviewed by me and considered in my medical decision making (see chart for details).     Patient with known history of atrial fibrillation.  He has been taking his blood thinner Eliquis.  But has not been taking his Lopressor on a regular  basis.  Patient's heart rate initially here was just in the 90s but then started to creep up to around  122.  Patient given 5 mg of Lopressor for heart rate down but then he started to creep back up in the 120s again.  So he was given a second dose.  Heart rate now staying around 90s.  Blood pressure is fine if a little bit on the borderline high side.  Had pharmacy to confirm his meds and go over them with patient.  He will resume his Lopressor.  Give his cardiologist a call on Monday.  May need some adjustment in his medications to help control the atrial fibrillation.  Patient will return for any sustained heart rate in the 140s or 150s.  Or for any new or worse symptoms.  Patient currently very stable at this time.  Final Clinical Impressions(s) / ED Diagnoses   Final diagnoses:  Atrial fibrillation with RVR [facility])    ED Discharge Orders    None       Fredia Sorrow, MD 01/09/18 2043

## 2018-01-11 ENCOUNTER — Telehealth: Payer: Self-pay | Admitting: Internal Medicine

## 2018-01-11 NOTE — Telephone Encounter (Signed)
  Patient spoke with Isac Caddy on 01/07/18 and was told to call back if he was still feeling bad. He went to the ER on 01/09/18 but he still isn't feeling well. He would like to speak to the nurse.

## 2018-01-11 NOTE — Telephone Encounter (Signed)
Spoke with pt, he went to the ER and they told him to get in touch with [place]. He is still getting elevated heart rates. Follow up scheduled with APP tomorrow.

## 2018-01-12 ENCOUNTER — Encounter: Payer: Self-pay | Admitting: Adult Health

## 2018-01-12 ENCOUNTER — Encounter: Payer: Self-pay | Admitting: Internal Medicine

## 2018-01-12 ENCOUNTER — Ambulatory Visit (INDEPENDENT_AMBULATORY_CARE_PROVIDER_SITE_OTHER): Payer: Medicare Other | Admitting: Adult Health

## 2018-01-12 ENCOUNTER — Other Ambulatory Visit: Payer: Self-pay | Admitting: Adult Health

## 2018-01-12 VITALS — BP 148/66 | HR 122 | Ht 69.0 in | Wt 198.2 lb

## 2018-01-12 DIAGNOSIS — I4891 Unspecified atrial fibrillation: Secondary | ICD-10-CM | POA: Diagnosis not present

## 2018-01-12 DIAGNOSIS — I2510 Atherosclerotic heart disease of native coronary artery without angina pectoris: Secondary | ICD-10-CM | POA: Diagnosis not present

## 2018-01-12 DIAGNOSIS — I519 Heart disease, unspecified: Secondary | ICD-10-CM | POA: Diagnosis not present

## 2018-01-12 DIAGNOSIS — I10 Essential (primary) hypertension: Secondary | ICD-10-CM | POA: Diagnosis not present

## 2018-01-12 DIAGNOSIS — Z79899 Other long term (current) drug therapy: Secondary | ICD-10-CM

## 2018-01-12 DIAGNOSIS — I4892 Unspecified atrial flutter: Secondary | ICD-10-CM

## 2018-01-12 MED ORDER — METOPROLOL TARTRATE 25 MG PO TABS: 25.0000 mg | ORAL_TABLET | Freq: Two times a day (BID) | ORAL | 3 refills | Status: DC

## 2018-01-12 NOTE — Patient Instructions (Signed)
Medication Instructions:  INCREASE METOPROLOL 25MG TWICE DAILY  If you need a refill on your cardiac medications before your next appointment, please call your pharmacy.  Labwork: BMET, TSH AND CBC HERE IN OUR OFFICE AT [facility] Take the provided lab slips with you to the lab for your blood draw.   If you have labs (blood work) drawn today and your tests are completely normal, you will receive your results only by: Marland Kitchen MyChart Message (if you have MyChart) OR . A paper copy in the mail If you have any lab test that is abnormal or we need to change your treatment, we will call you to review the results.  Testing/Procedures: Echocardiogram - Your physician has requested that you have an echocardiogram. Echocardiography is a painless test that uses sound waves to create images of your heart. It provides your doctor with information about the size and shape of your heart and how well your heart's chambers and valves are working. This procedure takes approximately one hour. There are no restrictions for this procedure. This will be performed at our [facility] location - [address], [address].  SCHEDULE CARDIOVERSION-MAKE SURE TO HOLD YOUR HYDROCHLOR0THIAIZIDE (HCTZ)THE MORNING OF THE PROCEDURE  Follow-Up: You will need a follow up appointment in [place].   You may see  DR Cleon Dew, DNP, AACC or one of the following Advanced Practice Providers on your designated Care Team:    . Almyra Deforest, [place], PA-C  At [facility], you and your health needs are our priority.  As part of our continuing mission to provide you with exceptional heart care, we have created designated Provider Care Teams.  These Care Teams include your primary Cardiologist (physician) and Advanced Practice Providers (APPs -  Physician Assistants and Nurse Practitioners) who all work together to provide you with the care you need, when you need it.

## 2018-01-12 NOTE — Progress Notes (Addendum)
Cardiology Office Note   Date:  01/12/2018   ID:  [name], DOB 1933/11/20, MRN [id number]  PCP:  Redmond School, MD  Cardiologist: Dr.  Debara Pickett  Chief Complaint  Patient presents with  . Atrial Fibrillation    PAF     History of Present Illness: [name] is a 84 y.o. male who presents for ER follow up after being seen for atrial fib. He has a known history of PAF but was noted to have home readings of HR > 100-120 bpm. The patient did not take his metoprolol for a few days prior to ER visit. He was not treated with any new medications as HR was reduced to 90 bpm in the ER. He has gone up on his metoprolol to 25 mg BID from 12.5 mg BID but has not found any relief from irregular HR.   He was last seen by Dr. Debara Pickett on  12/29/2017 and was found to be in NSR with 1st degree AV block, HR of 61 bpm with occasional PVC's.  Echo was last completed on 04/16/2013 with normal LV fx of 55%-65%. Left atrium was mildly dilated at (32m/m2).   He reports that he has not missed any doses of Eliquis or any other medications other than the metoprolol for a couple of days. He doesn't like the feeling of being in an irregular HR. He denies pain or DOE, no dizziness or bleeding.   Past Medical History:  Diagnosis Date  . BPH (benign prostatic hyperplasia)   . CAD (coronary artery disease) of artery bypass graft 2006  . Fatty liver   . GERD (gastroesophageal reflux disease)   . Gilbert's syndrome   . Hiatal hernia   . History of nuclear stress test 11/2007   bruce myoview; normal pattern of persuion in all regions; post-stress EF 70%; low risk   . HTN (hypertension)   . Neuropathy   . PAF ([place]oxysmal [place] fibrillation) ([place]   . PVD ([place]ipheral vascular disease) ([place]    0-49% R & L ICA stenosis (2013)   . [place]al artery aneurysm ([place]   . S/P CABG (coronary artery bypass graft) 2006  . Schatzki's ring    non critical  . Thrombocytopenia ([place]     Past Surgical History:  Procedure  Laterality Date  . CATARACT EXTRACTION  2011  . COLONOSCOPY  2006   Dr. RMargarito Courserpancolonic diverticula  . COLONOSCOPY  01/07/2012   Dr. RAbbe Amsterdamnormal rectum, pancolonic diverticulosis, hyperplastic polyp  . COLONOSCOPY N/A 03/20/2015   RWVP:XTGGYIRSWNdiverticulosis  . CORONARY ARTERY BYPASS GRAFT  04/2004   LIMA to diagonal, SVG to ramus intermedius, SVG to PDA; placed stent for crossed coronary arteries (Dr. VPrescott Gum  . ESOPHAGOGASTRODUODENOSCOPY  2006   Dr. RRuthell RummageSchatzki's ring, s/p 58-F Maloney dilation  . ESOPHAGOGASTRODUODENOSCOPY  01/07/2012   Dr. RGareth Morganring, hiatal hernia, fundic gland polyp  . ESOPHAGOGASTRODUODENOSCOPY N/A 03/20/2015   RIOE:VOJJK1 varices/HH Gastric polpy s/p bx  . rotater cuff     repair  . TONSILLECTOMY       Current Outpatient Medications  Medication Sig Dispense Refill  . albuterol (PROVENTIL HFA;VENTOLIN HFA) 108 (90 Base) MCG/ACT inhaler Inhale into the lungs every 6 (six) hours as needed for wheezing or shortness of breath.    . allopurinol (ZYLOPRIM) 100 MG tablet Take 100 mg by mouth at bedtime.     .Marland KitchenamLODipine (NORVASC) 5 MG tablet Take 1 tablet (5 mg total) by mouth daily. 90 tablet 3  .  apixaban (ELIQUIS) 5 MG TABS tablet Take 1 tablet (5 mg total) by mouth 2 (two) times daily. 180 tablet 3  . doxazosin (CARDURA) 8 MG tablet Take 8 mg by mouth at bedtime.     . finasteride (PROSCAR) 5 MG tablet Take 5 mg by mouth at bedtime. doesn't take daily    . flunisolide (NASALIDE) 0.025 % SOLN Inhale 2 sprays into the lungs daily as needed (FOR CONGESTION).     Marland Kitchen gabapentin (NEURONTIN) 300 MG capsule Take 300 mg by mouth 3 (three) times daily.      . hydrochlorothiazide (HYDRODIURIL) 25 MG tablet Take 0.5 tablets (12.5 mg total) by mouth daily. 45 tablet 3  . Levothyroxine Sodium 112 MCG CAPS Take 112 mcg by mouth daily before breakfast.     . losartan (COZAAR) 50 MG tablet Take 50 mg by mouth daily.    . metoprolol  tartrate (LOPRESSOR) 25 MG tablet Take 1 tablet (25 mg total) by mouth 2 (two) times daily. 180 tablet 3  . RABEprazole (ACIPHEX) 20 MG tablet TAKE ONE TABLET BY MOUTH ONCE DAILY BEFORE BREAKFAST (Patient taking differently: Take 20 mg by mouth every morning. ) 30 tablet 1  . tamsulosin (FLOMAX) 0.4 MG CAPS capsule Take 0.4 mg by mouth at bedtime.     . triamcinolone cream (KENALOG) 0.1 % Apply 1 application topically 2 (two) times daily as needed (FOR ITCHING/).     No current facility-administered medications for this visit.     Allergies:   Antihistamines, chlorpheniramine-type    Social History:  The patient  reports that he quit smoking about 13 years ago. His smoking use included cigarettes. He has a 40.00 pack-year smoking history. He has never used smokeless tobacco. He reports that he drinks alcohol. He reports that he does not use drugs.   Family History:  The patient's family history includes Diabetes in his brother; Heart attack (age of onset: 12) in his mother; Heart attack (age of onset: 68) in his father; Heart disease in his brother.    ROS: All other systems are reviewed and negative. Unless otherwise mentioned in H&P    PHYSICAL EXAM: VS:  BP (!) 148/66   Pulse (!) 122   Ht 5' 9"  (1.753 m)   Wt 198 lb 3.2 oz (89.9 kg)   SpO2 99%   BMI 29.27 kg/m  , BMI Body mass index is 29.27 kg/m. GEN: Well nourished, well developed, in no acute distress HEENT: normal Neck: no JVD, carotid bruits, or masses Cardiac: IRRR; soft systolic murmur, rubs, or gallops,no edema  Respiratory:  Clear to auscultation bilaterally, normal work of breathing GI: soft, nontender, nondistended, + BS MS: no deformity or atrophy Skin: warm and dry, no rash Neuro:  Strength and sensation are intact Psych: euthymic mood, full affect   EKG:  Atrial flutter/course atrial fib rate of 95 bpm, left axis deviation.   Recent Labs: 07/07/2017: ALT 24 01/09/2018: BUN 16; Creatinine, Ser 1.20; Hemoglobin  11.9; Platelets 147; Potassium 3.8; Sodium 136    Lipid Panel    Component Value Date/Time   CHOL 117 05/10/2014 0955   TRIG 60 05/10/2014 0955   HDL 55 05/10/2014 0955   CHOLHDL 2.4 11/07/2007 0540   VLDL 8 11/07/2007 0540   LDLCALC 50 05/10/2014 0955      Wt Readings from Last 3 Encounters:  01/12/18 198 lb 3.2 oz (89.9 kg)  01/09/18 200 lb (90.7 kg)  12/29/17 197 lb (89.4 kg)   Other studies Reviewed:  Echocardiogram 04/16/2013  Left ventricle: The cavity size was normal. There was moderate concentric hypertrophy. Systolic function was normal. The estimated ejection fraction was in the range of 55% to 65%. Incoordinate septal motion. LV diastolic function cannot be assessed due to atrial fibrillation. - Aortic valve: Mildly calcified leaflets. There was no stenosis. No regurgitation. - Mitral valve: Calcified annulus. Trivial regurgitation. - Left atrium: The atrium was mildly dilated (33 ml/\m2). - Systemic veins: The IVC was not well visualized. - Pericardium, extracardiac: There was no pericardial effusion.   ASSESSMENT AND PLAN:  1. PAF: EKG with course atrial fib/flutter, HR 95 bpm. I have spoken to Dr. Debara Pickett who is on site concerning plan of action, rate control, antiarrhythmics vs DCCV. Dr. Debara Pickett has reviewed this patient's chart and recent echo. It is recommended that the patient be scheduled for DCCV as he has not missed any doses of anticoagulation.   He will remain on metoprolol 25 mg BID. A repeat echo will be ordered along with a TSH. If DCCV is unsuccessful, he will be considered for antiarrhythmic therapy.   I have discussed risks and benefits and description of the procedure with the patient and his wife. They verbalized understanding and the patient is willing to proceed.   2. Hypertension: BP is slightly elevated today. Will continue BB, ARB  as directed along with HCTZ. Labs from ER have been reviewed.    Current medicines are reviewed  at length with the patient today.    Labs/ tests ordered today include: DCCV, echo and TSH.   Phill Myron. [place] Pugh, ANP, AACC   01/12/2018 12:54 PM    [address] Office [phone] Fax [phone]

## 2018-01-12 NOTE — H&P (View-Only) (Signed)
Cardiology Office Note   Date:  01/12/2018   ID:  [name], DOB 1933/10/15, MRN [id number]  PCP:  Redmond School, MD  Cardiologist: Dr.  Debara Pickett  Chief Complaint  Patient presents with  . Atrial Fibrillation    PAF     History of Present Illness: DAIDEN [name] is a 84 y.o. male who presents for ER follow up after being seen for atrial fib. He has a known history of PAF but was noted to have home readings of HR > 100-120 bpm. The patient did not take his metoprolol for a few days prior to ER visit. He was not treated with any new medications as HR was reduced to 90 bpm in the ER. He has gone up on his metoprolol to 25 mg BID from 12.5 mg BID but has not found any relief from irregular HR.   He was last seen by Dr. Debara Pickett on  12/29/2017 and was found to be in NSR with 1st degree AV block, HR of 61 bpm with occasional PVC's.  Echo was last completed on 04/16/2013 with normal LV fx of 55%-65%. Left atrium was mildly dilated at (55m/m2).   He reports that he has not missed any doses of Eliquis or any other medications other than the metoprolol for a couple of days. He doesn't like the feeling of being in an irregular HR. He denies pain or DOE, no dizziness or bleeding.   Past Medical History:  Diagnosis Date  . BPH (benign prostatic hyperplasia)   . CAD (coronary artery disease) of artery bypass graft 2006  . Fatty liver   . GERD (gastroesophageal reflux disease)   . Gilbert's syndrome   . Hiatal hernia   . History of nuclear stress test 11/2007   bruce myoview; normal pattern of persuion in all regions; post-stress EF 70%; low risk   . HTN (hypertension)   . Neuropathy   . PAF (paroxysmal atrial fibrillation) ([place]   . PVD (peripheral vascular disease) ([place]    0-49% R & L ICA stenosis (2013)   . Renal artery aneurysm ([place]   . S/P CABG (coronary artery bypass graft) 2006  . Schatzki's ring    non critical  . Thrombocytopenia ([place]     Past Surgical History:  Procedure  Laterality Date  . CATARACT EXTRACTION  2011  . COLONOSCOPY  2006   Dr. RMargarito Courserpancolonic diverticula  . COLONOSCOPY  01/07/2012   Dr. RAbbe Amsterdamnormal rectum, pancolonic diverticulosis, hyperplastic polyp  . COLONOSCOPY N/A 03/20/2015   RNHA:FBXUXYBFXOdiverticulosis  . CORONARY ARTERY BYPASS GRAFT  04/2004   LIMA to diagonal, SVG to ramus intermedius, SVG to PDA; placed stent for crossed coronary arteries (Dr. VPrescott Gum  . ESOPHAGOGASTRODUODENOSCOPY  2006   Dr. RRuthell RummageSchatzki's ring, s/p 58-F Maloney dilation  . ESOPHAGOGASTRODUODENOSCOPY  01/07/2012   Dr. RGareth Morganring, hiatal hernia, fundic gland polyp  . ESOPHAGOGASTRODUODENOSCOPY N/A 03/20/2015   RVAN:VBTYO1 varices/HH Gastric polpy s/p bx  . rotater cuff     repair  . TONSILLECTOMY       Current Outpatient Medications  Medication Sig Dispense Refill  . albuterol (PROVENTIL HFA;VENTOLIN HFA) 108 (90 Base) MCG/ACT inhaler Inhale into the lungs every 6 (six) hours as needed for wheezing or shortness of breath.    . allopurinol (ZYLOPRIM) 100 MG tablet Take 100 mg by mouth at bedtime.     .Marland KitchenamLODipine (NORVASC) 5 MG tablet Take 1 tablet (5 mg total) by mouth daily. 90 tablet 3  .  apixaban (ELIQUIS) 5 MG TABS tablet Take 1 tablet (5 mg total) by mouth 2 (two) times daily. 180 tablet 3  . doxazosin (CARDURA) 8 MG tablet Take 8 mg by mouth at bedtime.     . finasteride (PROSCAR) 5 MG tablet Take 5 mg by mouth at bedtime. doesn't take daily    . flunisolide (NASALIDE) 0.025 % SOLN Inhale 2 sprays into the lungs daily as needed (FOR CONGESTION).     Marland Kitchen gabapentin (NEURONTIN) 300 MG capsule Take 300 mg by mouth 3 (three) times daily.      . hydrochlorothiazide (HYDRODIURIL) 25 MG tablet Take 0.5 tablets (12.5 mg total) by mouth daily. 45 tablet 3  . Levothyroxine Sodium 112 MCG CAPS Take 112 mcg by mouth daily before breakfast.     . losartan (COZAAR) 50 MG tablet Take 50 mg by mouth daily.    . metoprolol  tartrate (LOPRESSOR) 25 MG tablet Take 1 tablet (25 mg total) by mouth 2 (two) times daily. 180 tablet 3  . RABEprazole (ACIPHEX) 20 MG tablet TAKE ONE TABLET BY MOUTH ONCE DAILY BEFORE BREAKFAST (Patient taking differently: Take 20 mg by mouth every morning. ) 30 tablet 1  . tamsulosin (FLOMAX) 0.4 MG CAPS capsule Take 0.4 mg by mouth at bedtime.     . triamcinolone cream (KENALOG) 0.1 % Apply 1 application topically 2 (two) times daily as needed (FOR ITCHING/).     No current facility-administered medications for this visit.     Allergies:   Antihistamines, chlorpheniramine-type    Social History:  The patient  reports that he quit smoking about 13 years ago. His smoking use included cigarettes. He has a 40.00 pack-year smoking history. He has never used smokeless tobacco. He reports that he drinks alcohol. He reports that he does not use drugs.   Family History:  The patient's family history includes Diabetes in his brother; Heart attack (age of onset: 70) in his mother; Heart attack (age of onset: 7) in his father; Heart disease in his brother.    ROS: All other systems are reviewed and negative. Unless otherwise mentioned in H&P    PHYSICAL EXAM: VS:  BP (!) 148/66   Pulse (!) 122   Ht 5' 9"  (1.753 m)   Wt 198 lb 3.2 oz (89.9 kg)   SpO2 99%   BMI 29.27 kg/m  , BMI Body mass index is 29.27 kg/m. GEN: Well nourished, well developed, in no acute distress HEENT: normal Neck: no JVD, carotid bruits, or masses Cardiac: IRRR; soft systolic murmur, rubs, or gallops,no edema  Respiratory:  Clear to auscultation bilaterally, normal work of breathing GI: soft, nontender, nondistended, + BS MS: no deformity or atrophy Skin: warm and dry, no rash Neuro:  Strength and sensation are intact Psych: euthymic mood, full affect   EKG:  Atrial flutter/course atrial fib rate of 95 bpm, left axis deviation.   Recent Labs: 07/07/2017: ALT 24 01/09/2018: BUN 16; Creatinine, Ser 1.20; Hemoglobin  11.9; Platelets 147; Potassium 3.8; Sodium 136    Lipid Panel    Component Value Date/Time   CHOL 117 05/10/2014 0955   TRIG 60 05/10/2014 0955   HDL 55 05/10/2014 0955   CHOLHDL 2.4 11/07/2007 0540   VLDL 8 11/07/2007 0540   LDLCALC 50 05/10/2014 0955      Wt Readings from Last 3 Encounters:  01/12/18 198 lb 3.2 oz (89.9 kg)  01/09/18 200 lb (90.7 kg)  12/29/17 197 lb (89.4 kg)   Other studies Reviewed:  Echocardiogram 04/16/2013  Left ventricle: The cavity size was normal. There was moderate concentric hypertrophy. Systolic function was normal. The estimated ejection fraction was in the range of 55% to 65%. Incoordinate septal motion. LV diastolic function cannot be assessed due to atrial fibrillation. - Aortic valve: Mildly calcified leaflets. There was no stenosis. No regurgitation. - Mitral valve: Calcified annulus. Trivial regurgitation. - Left atrium: The atrium was mildly dilated (33 ml/\m2). - Systemic veins: The IVC was not well visualized. - Pericardium, extracardiac: There was no pericardial effusion.   ASSESSMENT AND PLAN:  1. PAF: EKG with course atrial fib/flutter, HR 95 bpm. I have spoken to Dr. Debara Pickett who is on site concerning plan of action, rate control, antiarrhythmics vs DCCV. Dr. Debara Pickett has reviewed this patient's chart and recent echo. It is recommended that the patient be scheduled for DCCV as he has not missed any doses of anticoagulation.   He will remain on metoprolol 25 mg BID. A repeat echo will be ordered along with a TSH. If DCCV is unsuccessful, he will be considered for antiarrhythmic therapy.   I have discussed risks and benefits and description of the procedure with the patient and his wife. They verbalized understanding and the patient is willing to proceed.   2. Hypertension: BP is slightly elevated today. Will continue BB, ARB  as directed along with HCTZ. Labs from ER have been reviewed.    Current medicines are reviewed  at length with the patient today.    Labs/ tests ordered today include: DCCV, echo and TSH.   Phill Myron. West Pugh, ANP, AACC   01/12/2018 12:54 PM    [place] [address] Office [phone] Fax [phone]

## 2018-01-13 ENCOUNTER — Other Ambulatory Visit: Payer: Self-pay

## 2018-01-13 ENCOUNTER — Ambulatory Visit (HOSPITAL_COMMUNITY): Payer: Medicare Other | Attending: Cardiology

## 2018-01-13 DIAGNOSIS — I4892 Unspecified atrial flutter: Secondary | ICD-10-CM | POA: Diagnosis not present

## 2018-01-13 DIAGNOSIS — I519 Heart disease, unspecified: Secondary | ICD-10-CM | POA: Insufficient documentation

## 2018-01-13 DIAGNOSIS — I4891 Unspecified atrial fibrillation: Secondary | ICD-10-CM

## 2018-01-13 LAB — BASIC METABOLIC PANEL
BUN/Creatinine Ratio: 11 (ref 10–24)
BUN: 13 mg/dL (ref 8–27)
CHLORIDE: 102 mmol/L (ref 96–106)
CO2: 21 mmol/L (ref 20–29)
Calcium: 9.7 mg/dL (ref 8.6–10.2)
Creatinine, Ser: 1.18 mg/dL (ref 0.76–1.27)
GFR calc Af Amer: 66 mL/min/{1.73_m2} (ref >59)
GFR calc non Af Amer: 57 mL/min/{1.73_m2} — ABNORMAL LOW (ref >59)
Glucose: 96 mg/dL (ref 65–99)
Potassium: 4.2 mmol/L (ref 3.5–5.2)
Sodium: 139 mmol/L (ref 134–144)

## 2018-01-13 LAB — CBC
Hematocrit: 32 % — ABNORMAL LOW (ref 37.5–51.0)
Hemoglobin: 11.1 g/dL — ABNORMAL LOW (ref 13.0–17.7)
MCH: 33.7 pg — ABNORMAL HIGH (ref 26.6–33.0)
MCHC: 34.7 g/dL (ref 31.5–35.7)
MCV: 97 fL (ref 79–97)
Platelets: 147 10*3/uL — ABNORMAL LOW (ref 150–450)
RBC: 3.29 x10E6/uL — ABNORMAL LOW (ref 4.14–5.80)
RDW: 11.8 % — ABNORMAL LOW (ref 12.3–15.4)
WBC: 4.8 10*3/uL (ref 3.4–10.8)

## 2018-01-13 LAB — TSH: TSH: 1.21 u[IU]/mL (ref 0.450–4.500)

## 2018-01-14 ENCOUNTER — Ambulatory Visit (HOSPITAL_COMMUNITY): Payer: Medicare Other | Admitting: Certified Registered"

## 2018-01-14 ENCOUNTER — Other Ambulatory Visit: Payer: Self-pay

## 2018-01-14 ENCOUNTER — Encounter (HOSPITAL_COMMUNITY): Payer: Self-pay | Admitting: *Deleted

## 2018-01-14 ENCOUNTER — Ambulatory Visit (HOSPITAL_COMMUNITY)
Admission: RE | Admit: 2018-01-14 | Discharge: 2018-01-14 | Disposition: A | Discharge status: Home / Self Care | Payer: Medicare Other | Attending: Internal Medicine | Admitting: Internal Medicine

## 2018-01-14 ENCOUNTER — Encounter (HOSPITAL_COMMUNITY)
Admission: RE | Disposition: A | Discharge status: Home / Self Care | Payer: Self-pay | Source: Home / Self Care | Attending: Internal Medicine

## 2018-01-14 DIAGNOSIS — I4891 Unspecified atrial fibrillation: Secondary | ICD-10-CM | POA: Diagnosis not present

## 2018-01-14 DIAGNOSIS — D649 Anemia, unspecified: Secondary | ICD-10-CM | POA: Diagnosis not present

## 2018-01-14 DIAGNOSIS — Z8249 Family history of ischemic heart disease and other diseases of the circulatory system: Secondary | ICD-10-CM | POA: Diagnosis not present

## 2018-01-14 DIAGNOSIS — Z888 Allergy status to other drugs, medicaments and biological substances status: Secondary | ICD-10-CM | POA: Insufficient documentation

## 2018-01-14 DIAGNOSIS — I2510 Atherosclerotic heart disease of native coronary artery without angina pectoris: Secondary | ICD-10-CM | POA: Insufficient documentation

## 2018-01-14 DIAGNOSIS — I440 Atrioventricular block, first degree: Secondary | ICD-10-CM | POA: Diagnosis not present

## 2018-01-14 DIAGNOSIS — I480 Paroxysmal atrial fibrillation: Secondary | ICD-10-CM | POA: Diagnosis not present

## 2018-01-14 DIAGNOSIS — Z79899 Other long term (current) drug therapy: Secondary | ICD-10-CM | POA: Insufficient documentation

## 2018-01-14 DIAGNOSIS — K219 Gastro-esophageal reflux disease without esophagitis: Secondary | ICD-10-CM | POA: Diagnosis not present

## 2018-01-14 DIAGNOSIS — Z7901 Long term (current) use of anticoagulants: Secondary | ICD-10-CM | POA: Insufficient documentation

## 2018-01-14 DIAGNOSIS — I10 Essential (primary) hypertension: Secondary | ICD-10-CM | POA: Diagnosis not present

## 2018-01-14 DIAGNOSIS — N400 Enlarged prostate without lower urinary tract symptoms: Secondary | ICD-10-CM | POA: Insufficient documentation

## 2018-01-14 DIAGNOSIS — Z951 Presence of aortocoronary bypass graft: Secondary | ICD-10-CM | POA: Insufficient documentation

## 2018-01-14 DIAGNOSIS — Z79890 Hormone replacement therapy (postmenopausal): Secondary | ICD-10-CM | POA: Diagnosis not present

## 2018-01-14 DIAGNOSIS — I4892 Unspecified atrial flutter: Secondary | ICD-10-CM | POA: Insufficient documentation

## 2018-01-14 DIAGNOSIS — Z87891 Personal history of nicotine dependence: Secondary | ICD-10-CM | POA: Diagnosis not present

## 2018-01-14 DIAGNOSIS — G629 Polyneuropathy, unspecified: Secondary | ICD-10-CM | POA: Insufficient documentation

## 2018-01-14 HISTORY — PX: CARDIOVERSION: SHX1299

## 2018-01-14 SURGERY — CARDIOVERSION
Anesthesia: General

## 2018-01-14 MED ORDER — LIDOCAINE 2% (20 MG/ML) 5 ML SYRINGE
INTRAMUSCULAR | Status: DC | PRN
  Administered 2018-01-14: 40 mg via INTRAVENOUS

## 2018-01-14 MED ORDER — SODIUM CHLORIDE 0.9 % IV SOLN
INTRAVENOUS | Status: DC
  Administered 2018-01-14: 11:00:00 via INTRAVENOUS

## 2018-01-14 MED ORDER — PROPOFOL 1000 MG/100ML IV EMUL
INTRAVENOUS | Status: AC
  Filled 2018-01-14: qty 100

## 2018-01-14 MED ORDER — PROPOFOL 10 MG/ML IV BOLUS
INTRAVENOUS | Status: DC | PRN
  Administered 2018-01-14: 70 mg via INTRAVENOUS

## 2018-01-14 MED ORDER — PROMETHAZINE HCL 25 MG/ML IJ SOLN: 6.2500 mg | INTRAMUSCULAR | Status: DC | PRN

## 2018-01-14 NOTE — Anesthesia Preprocedure Evaluation (Addendum)
Anesthesia Evaluation  Patient identified by MRN, date of birth, ID band Patient awake    Reviewed: Allergy & Precautions, NPO status , Patient's Chart, lab work & pertinent test results  Airway Mallampati: III  TM Distance: >3 FB Neck ROM: Full  Mouth opening: Limited Mouth Opening  Dental  (+) Teeth Intact, Dental Advisory Given   Pulmonary former smoker,    Pulmonary exam normal breath sounds clear to auscultation       Cardiovascular hypertension, Pt. on medications and Pt. on home beta blockers + CAD, + CABG and + Peripheral Vascular Disease  Normal cardiovascular exam+ dysrhythmias Atrial Fibrillation  Rhythm:Irregular Rate:Normal  TTE 01/13/18: moderate LVH, EF 55-60%, mild LAE, mild TR   Neuro/Psych negative neurological ROS  negative psych ROS   GI/Hepatic Neg liver ROS, hiatal hernia, GERD  ,  Endo/Other    Renal/GU Renal artery aneurysm     Musculoskeletal negative musculoskeletal ROS (+)   Abdominal   Peds  Hematology  (+) anemia , Hgb 11.1 on 01/12/18   Anesthesia Other Findings   Reproductive/Obstetrics                           Anesthesia Physical Anesthesia Plan  ASA: III  Anesthesia Plan: General   Post-op Pain Management:    Induction: Intravenous  PONV Risk Score and Plan: 2 and Treatment may vary due to age or medical condition and Propofol infusion  Airway Management Planned: Mask  Additional Equipment:   Intra-op Plan:   Post-operative Plan:   Informed Consent: I have reviewed the patients History and Physical, chart, labs and discussed the procedure including the risks, benefits and alternatives for the proposed anesthesia with the patient or authorized representative who has indicated his/her understanding and acceptance.     Plan Discussed with: CRNA  Anesthesia Plan Comments:        Anesthesia Quick Evaluation

## 2018-01-14 NOTE — Anesthesia Procedure Notes (Signed)
Procedure Name: General with mask airway Date/Time: 01/14/2018 12:27 PM Performed by: Jearld Pies, CRNA Pre-anesthesia Checklist: Patient identified, Emergency Drugs available, Suction available, Patient being monitored and Timeout performed Patient Re-evaluated:Patient Re-evaluated prior to induction Oxygen Delivery Method: Simple face mask Preoxygenation: Pre-oxygenation with 100% oxygen

## 2018-01-14 NOTE — Addendum Note (Signed)
Addendum  created 01/14/18 1310 by Jearld Pies, CRNA   Intraprocedure Flowsheets edited

## 2018-01-14 NOTE — Discharge Instructions (Signed)
Electrical Cardioversion, Care After °This sheet gives you information about how to care for yourself after your procedure. Your health care provider may also give you more specific instructions. If you have problems or questions, contact your health care provider. °What can I expect after the procedure? °After the procedure, it is common to have: °· Some redness on the skin where the shocks were given. ° °Follow these instructions at home: °· Do not drive for 24 hours if you were given a medicine to help you relax (sedative). °· Take over-the-counter and prescription medicines only as told by your health care provider. °· Ask your health care provider how to check your pulse. Check it often. °· Rest for 48 hours after the procedure or as told by your health care provider. °· Avoid or limit your caffeine use as told by your health care provider. °Contact a health care provider if: °· You feel like your heart is beating too quickly or your pulse is not regular. °· You have a serious muscle cramp that does not go away. °Get help right away if: °· You have discomfort in your chest. °· You are dizzy or you feel faint. °· You have trouble breathing or you are short of breath. °· Your speech is slurred. °· You have trouble moving an arm or leg on one side of your body. °· Your fingers or toes turn cold or blue. °This information is not intended to replace advice given to you by your health care provider. Make sure you discuss any questions you have with your health care provider. °Document Released: 11/10/2012 Document Revised: 08/24/2015 Document Reviewed: 07/27/2015 °Elsevier Interactive Patient Education © 2018 [employer] ° °

## 2018-01-14 NOTE — Interval H&P Note (Signed)
History and Physical Interval Note:  01/14/2018 12:46 PM  [name]  has presented today for surgery, with the diagnosis of a fib  The various methods of treatment have been discussed with the patient and family. After consideration of risks, benefits and other options for treatment, the patient has consented to  Procedure(s): CARDIOVERSION as a surgical intervention .  The patient's history has been reviewed, patient examined, no change in status, stable for surgery.  I have reviewed the patient's chart and labs.  Questions were answered to the patient's satisfaction.     [name]

## 2018-01-14 NOTE — Anesthesia Postprocedure Evaluation (Signed)
Anesthesia Post Note  Patient: [name]  Procedure(s) Performed: CARDIOVERSION (N/A )     Patient location during evaluation: PACU Anesthesia Type: General Level of consciousness: awake and alert Pain management: pain level controlled Vital Signs Assessment: post-procedure vital signs reviewed and stable Respiratory status: spontaneous breathing, nonlabored ventilation and respiratory function stable Cardiovascular status: blood pressure returned to baseline and stable Postop Assessment: no apparent nausea or vomiting Anesthetic complications: no    Last Vitals:  Vitals:   01/14/18 1105 01/14/18 1244  BP: (!) 156/78 (!) 132/55  Pulse: (!) 122 79  Resp: (!) 21 14  Temp: 36.9 C 36.6 C  SpO2: 99% 97%    Last Pain:  Vitals:   01/14/18 1244  TempSrc: Oral  PainSc: 0-No pain                 Brennan Bailey

## 2018-01-14 NOTE — CV Procedure (Signed)
Procedure: Electrical Cardioversion Indications:  Atrial Fibrillation and Atrial Flutter  Procedure Details:  Consent: Risks of procedure as well as the alternatives and risks of each were explained to the (patient/caregiver).  Consent for procedure obtained.  Time Out: Verified patient identification, verified procedure, site/side was marked, verified correct patient position, special equipment/implants available, medications/allergies/relevent history reviewed, required imaging and test results available. TIME OUT PERFORMED.  Patient placed on cardiac monitor, pulse oximetry, supplemental oxygen as necessary.  Sedation given: per anesthesia. Pacer pads placed anterior and posterior chest.  Cardioverted 1 time(s).  Cardioversion with synchronized biphasic120 J shock.  Evaluation: Findings: Post procedure EKG shows: NSR Complications: No complications. Patient tolerated procedure well.  Time Spent Directly with the Patient:  20 minutes   [name] 01/14/2018, 12:47 PM

## 2018-01-14 NOTE — Transfer of Care (Signed)
Immediate Anesthesia Transfer of Care Note  Patient: Phi Avans Kines  Procedure(s) Performed: CARDIOVERSION (N/A )  Patient Location: Endoscopy Unit  Anesthesia Type:General  Level of Consciousness: awake, alert  and oriented  Airway & Oxygen Therapy: Patient Spontanous Breathing and Patient connected to face mask  Post-op Assessment: Report given to RN and Post -op Vital signs reviewed and stable  Post vital signs: Reviewed and stable  Last Vitals:  Vitals Value Taken Time  BP 151/56   Temp    Pulse 78   Resp 16   SpO2 100     Last Pain:  Vitals:   01/14/18 1105  TempSrc: Oral  PainSc: 0-No pain     Report to PAT RN    Complications: No apparent anesthesia complications

## 2018-01-16 ENCOUNTER — Encounter (HOSPITAL_COMMUNITY): Payer: Self-pay | Admitting: Internal Medicine

## 2018-01-20 ENCOUNTER — Telehealth: Payer: Self-pay | Admitting: *Deleted

## 2018-01-20 NOTE — Telephone Encounter (Signed)
Patient called stating he takes his aciphex once a day. He has noticed the past few days he is having acid run back up into his throat. He wants to know what to do.

## 2018-01-22 NOTE — Telephone Encounter (Signed)
I am sorry, I did not realize this had not been addressed. May take Zantac or Pepcid prn in evening. Strictly follow a GERD diet. IF needed, we could bump up Aciphex to twice a day for a short course.

## 2018-01-22 NOTE — Telephone Encounter (Signed)
Called home# NA, no VM. Called mobile-LMTCB

## 2018-01-22 NOTE — Telephone Encounter (Signed)
Patient called back and is aware of below. He voiced understanding.

## 2018-01-25 ENCOUNTER — Encounter: Payer: Self-pay | Admitting: Adult Health

## 2018-01-25 ENCOUNTER — Ambulatory Visit (INDEPENDENT_AMBULATORY_CARE_PROVIDER_SITE_OTHER): Payer: Medicare Other | Admitting: Adult Health

## 2018-01-25 VITALS — BP 130/50 | HR 69 | Ht 69.0 in | Wt 198.4 lb

## 2018-01-25 DIAGNOSIS — I2510 Atherosclerotic heart disease of native coronary artery without angina pectoris: Secondary | ICD-10-CM

## 2018-01-25 DIAGNOSIS — I480 Paroxysmal atrial fibrillation: Secondary | ICD-10-CM | POA: Diagnosis not present

## 2018-01-25 DIAGNOSIS — I10 Essential (primary) hypertension: Secondary | ICD-10-CM

## 2018-01-25 NOTE — Patient Instructions (Signed)
Follow-Up: You will need a follow up appointment in 11 months.  Please call our office 2 months  (SEPT 2020)   in advance to schedule this appointment  (NOVEMBER 2020) .  You may see Pixie Casino, MD  Jory Sims, DNP, AACC  or one of the following Advanced Practice Providers on your designated Care Team:   Almyra Deforest, PA-C   Fabian Sharp, [place]   Medication Instructions:  NO CHANGES- Your physician recommends that you continue on your current medications as directed. Please refer to the Current Medication list given to you today. If you need a refill on your cardiac medications before your next appointment, please call your pharmacy. Labwork: When you have labs (blood work) and your tests are completely normal, you will receive your results ONLY by [place] (if you have MyChart) -OR- A paper copy in the mail. At [facility], you and your health needs are our priority.  As part of our continuing mission to provide you with exceptional heart care, we have created designated Provider Care Teams.  These Care Teams include your primary Cardiologist (physician) and Advanced Practice Providers (APPs -  Physician Assistants and Nurse Practitioners) who all work together to provide you with the care you need, when you need it.  Thank you for choosing [facility] at [facility]!!

## 2018-01-25 NOTE — Progress Notes (Signed)
Cardiology Office Note   Date:  01/25/2018   ID:  [name], DOB Oct 03, 1933, MRN [id number]  PCP:  Redmond School, MD  Cardiologist:  Dr. Debara Pickett    Chief Complaint  Patient presents with  . Hospitalization Follow-up  . Atrial Fibrillation    s/p DCCV     History of Present Illness: [name] is a 84 y.o. male who presents for post procedure follow up after OP admission for DCCV. He has a known history of PAF. He had not taken BB for a few days prior to procedure and was restarted on this medication on last office visit on 01/12/2018, had been taking Eliquis as directed, and therefore scheduled for DCCV.  He had successful DCCV on 1212/2019 with Dr. Margaretann Loveless after one biphasic 120 j shock.   He comes today feeling "great!" He states that he has had no recurrence of rapid HR. He is medically compliant with BB, Eliquis and antihypertensives.  Denies bleeding on the Eliquis.   Past Medical History:  Diagnosis Date  . BPH (benign prostatic hyperplasia)   . CAD (coronary artery disease) of artery bypass graft 2006  . Fatty liver   . GERD (gastroesophageal reflux disease)   . Gilbert's syndrome   . Hiatal hernia   . History of nuclear stress test 11/2007   bruce myoview; normal pattern of persuion in all regions; post-stress EF 70%; low risk   . HTN (hypertension)   . Neuropathy   . PAF (paroxysmal atrial fibrillation) ([place])   . PVD (peripheral vascular disease) ([place])    0-49% R & L ICA stenosis (2013)   . Renal artery aneurysm ([place])   . S/P CABG (coronary artery bypass graft) 2006  . Schatzki's ring    non critical  . Thrombocytopenia ([place])     Past Surgical History:  Procedure Laterality Date  . CARDIOVERSION N/A 01/14/2018   Procedure: CARDIOVERSION;  Surgeon: [name], MD;  Location: [facility] ENDOSCOPY;  Service: Cardiovascular;  Laterality: N/A;  . CATARACT EXTRACTION  2011  . COLONOSCOPY  2006   Dr. Margarito Courser pancolonic diverticula  . COLONOSCOPY   01/07/2012   Dr. Abbe Amsterdam normal rectum, pancolonic diverticulosis, hyperplastic polyp  . COLONOSCOPY N/A 03/20/2015   [id number] diverticulosis  . CORONARY ARTERY BYPASS GRAFT  04/2004   LIMA to diagonal, SVG to ramus intermedius, SVG to PDA; placed stent for crossed coronary arteries (Dr. Prescott Gum)  . ESOPHAGOGASTRODUODENOSCOPY  2006   Dr. Ruthell Rummage Schatzki's ring, s/p 58-F Maloney dilation  . ESOPHAGOGASTRODUODENOSCOPY  01/07/2012   Dr. Gareth Morgan ring, hiatal hernia, fundic gland polyp  . ESOPHAGOGASTRODUODENOSCOPY N/A 03/20/2015   HQI:ONGEX 1 varices/HH Gastric polpy s/p bx  . rotater cuff     repair  . TONSILLECTOMY       Current Outpatient Medications  Medication Sig Dispense Refill  . albuterol (PROVENTIL HFA;VENTOLIN HFA) 108 (90 Base) MCG/ACT inhaler Inhale into the lungs every 6 (six) hours as needed for wheezing or shortness of breath.    . allopurinol (ZYLOPRIM) 100 MG tablet Take 100 mg by mouth at bedtime.     Marland Kitchen amLODipine (NORVASC) 5 MG tablet Take 1 tablet (5 mg total) by mouth daily. 90 tablet 3  . apixaban (ELIQUIS) 5 MG TABS tablet Take 1 tablet (5 mg total) by mouth 2 (two) times daily. 180 tablet 3  . doxazosin (CARDURA) 8 MG tablet Take 8 mg by mouth at bedtime.     . finasteride (PROSCAR) 5 MG tablet Take 5 mg  by mouth at bedtime. doesn't take daily    . flunisolide (NASALIDE) 0.025 % SOLN Inhale 2 sprays into the lungs daily as needed (FOR CONGESTION).     Marland Kitchen gabapentin (NEURONTIN) 300 MG capsule Take 300 mg by mouth 3 (three) times daily.      . hydrochlorothiazide (HYDRODIURIL) 25 MG tablet Take 0.5 tablets (12.5 mg total) by mouth daily. 45 tablet 3  . Levothyroxine Sodium 112 MCG CAPS Take 112 mcg by mouth daily before breakfast.     . losartan (COZAAR) 50 MG tablet Take 50 mg by mouth daily.    . metoprolol tartrate (LOPRESSOR) 25 MG tablet Take 1 tablet (25 mg total) by mouth 2 (two) times daily. 180 tablet 3  . RABEprazole (ACIPHEX)  20 MG tablet TAKE ONE TABLET BY MOUTH ONCE DAILY BEFORE BREAKFAST (Patient taking differently: Take 20 mg by mouth every morning. ) 30 tablet 1  . tamsulosin (FLOMAX) 0.4 MG CAPS capsule Take 0.4 mg by mouth at bedtime.     . triamcinolone cream (KENALOG) 0.1 % Apply 1 application topically 2 (two) times daily as needed (FOR ITCHING/).     No current facility-administered medications for this visit.     Allergies:   Antihistamines, chlorpheniramine-type    Social History:  The patient  reports that he quit smoking about 13 years ago. His smoking use included cigarettes. He has a 40.00 pack-year smoking history. He has never used smokeless tobacco. He reports current alcohol use. He reports that he does not use drugs.   Family History:  The patient's family history includes Diabetes in his brother; Heart attack (age of onset: 94) in his mother; Heart attack (age of onset: 35) in his father; Heart disease in his brother.    ROS: All other systems are reviewed and negative. Unless otherwise mentioned in H&P    PHYSICAL EXAM: VS:  BP (!) 130/50   Pulse 69   Ht 5' 9"  (1.753 m)   Wt 198 lb 6.4 oz (90 kg)   BMI 29.30 kg/m  , BMI Body mass index is 29.3 kg/m. GEN: Well nourished, well developed, in no acute distress HEENT: normal Neck: no JVD, carotid bruits, or masses Cardiac: RRR; no murmurs, rubs, or gallops,no edema  Respiratory:  Clear to auscultation bilaterally, normal work of breathing GI: soft, nontender, nondistended, + BS MS: no deformity or atrophy Skin: warm and dry, no rash Neuro:  Strength and sensation are intact Psych: euthymic mood, full affect   EKG: SR with 1st degree AV block. Rate of 69 bpm.   Recent Labs: 07/07/2017: ALT 24 01/12/2018: BUN 13; Creatinine, Ser 1.18; Hemoglobin 11.1; Platelets 147; Potassium 4.2; Sodium 139; TSH 1.210    Lipid Panel    Component Value Date/Time   CHOL 117 05/10/2014 0955   TRIG 60 05/10/2014 0955   HDL 55 05/10/2014 0955     CHOLHDL 2.4 11/07/2007 0540   VLDL 8 11/07/2007 0540   LDLCALC 50 05/10/2014 0955      Wt Readings from Last 3 Encounters:  01/25/18 198 lb 6.4 oz (90 kg)  01/14/18 198 lb 3.2 oz (89.9 kg)  01/12/18 198 lb 3.2 oz (89.9 kg)      Other studies Reviewed: Echocardiogram 02-Feb-2018  Left ventricle: The cavity size was normal. Wall thickness was   increased in a pattern of moderate LVH. Systolic function was   normal. The estimated ejection fraction was in the range of 55%   to 60%. Although no diagnostic regional wall  motion abnormality   was identified, this possibility cannot be completely excluded on   the basis of this study. The study was not technically sufficient   to allow evaluation of LV diastolic dysfunction due to atrial   fibrillation. - Aortic valve: Trileaflet; mildly thickened, mildly calcified   leaflets. Right coronary cusp mobility was mildly restricted.   Sclerosis without stenosis. - Mitral valve: Calcified annulus. Mildly thickened leaflets .   There was trivial regurgitation. - Left atrium: The atrium was mildly dilated. - Right ventricle: Systolic function was normal. - Atrial septum: No defect or patent foramen ovale was identified. - Tricuspid valve: There was mild regurgitation. - Pulmonary arteries: PA peak pressure: 36 mm Hg (S).  ASSESSMENT AND PLAN:  1.  PAF: S/P DCCV by Dr. Margaretann Loveless. He has remained in NSR, and is now compliant with BB. He remains on Eliquis. No further complaints of rapid or irregular HR. Continue metoprolol 25 mg BIF.   2. Hypertension: On amlodipine and losartan. Good control of BP.    Current medicines are reviewed at length with the patient today.    Labs/ tests ordered today include:  Phill Myron. [place] Pugh, ANP, AACC   01/25/2018 1:34 PM    [address] Office [phone] Fax [phone]

## 2018-04-22 ENCOUNTER — Encounter: Payer: Self-pay | Admitting: Internal Medicine

## 2018-07-06 ENCOUNTER — Other Ambulatory Visit: Payer: Self-pay

## 2018-07-06 ENCOUNTER — Encounter: Payer: Self-pay | Admitting: Internal Medicine

## 2018-07-06 ENCOUNTER — Ambulatory Visit (INDEPENDENT_AMBULATORY_CARE_PROVIDER_SITE_OTHER): Payer: Medicare Other | Admitting: Internal Medicine

## 2018-07-06 VITALS — BP 148/56 | HR 67 | Temp 98.8°F | Ht 69.0 in | Wt 197.6 lb

## 2018-07-06 DIAGNOSIS — K7460 Unspecified cirrhosis of liver: Secondary | ICD-10-CM

## 2018-07-06 DIAGNOSIS — K219 Gastro-esophageal reflux disease without esophagitis: Secondary | ICD-10-CM

## 2018-07-06 DIAGNOSIS — K7581 Nonalcoholic steatohepatitis (NASH): Secondary | ICD-10-CM

## 2018-07-06 NOTE — Patient Instructions (Signed)
Continue Rabeprazole 20 mg daily  Schedule an OV with [place] in October 2020 to set up a surveillance EGD  Call if any interim problems

## 2018-07-06 NOTE — H&P (View-Only) (Signed)
Primary Care Physician:  Redmond School, MD Primary Gastroenterologist:  Dr. Gala Romney  Pre-Procedure History & Physical: HPI:  [name] is a 85 y.o. male here for follow-up of [name] cirrhosis.  Negative screening hepatic ultrasound Last year.  Due for 1 now.  Also history grade 1 esophageal varices patient does not want change out metoprolol for any other agents states metoprolol is "life-saving".  No dysphagia.  No melena rectal bleeding.  He remains very active chronically anticoagulated secondary to atrial fibrillation.  In fact, getting ready to play 9 holes of golf when he leaves this appointment.  No dysphagia.  Past Medical History:  Diagnosis Date   BPH (benign prostatic hyperplasia)    CAD (coronary artery disease) of artery bypass graft 2006   Fatty liver    GERD (gastroesophageal reflux disease)    Gilbert's syndrome    Hiatal hernia    History of nuclear stress test 11/2007   bruce myoview; normal pattern of persuion in all regions; post-stress EF 70%; low risk    HTN (hypertension)    Neuropathy    PAF (paroxysmal atrial fibrillation) ([facility])    PVD (peripheral vascular disease) ([facility])    0-49% R & L ICA stenosis (2013)    Renal artery aneurysm ([facility])    S/P CABG (coronary artery bypass graft) 2006   Schatzki's ring    non critical   Thrombocytopenia ([place])     Past Surgical History:  Procedure Laterality Date   CARDIOVERSION N/A 01/14/2018   Procedure: CARDIOVERSION;  Surgeon: [name], MD;  Location: [facility] ENDOSCOPY;  Service: Cardiovascular;  Laterality: N/A;   CATARACT EXTRACTION  2011   COLONOSCOPY  2006   Dr. Margarito Courser pancolonic diverticula   COLONOSCOPY  01/07/2012   Dr. Abbe Amsterdam normal rectum, pancolonic diverticulosis, hyperplastic polyp   COLONOSCOPY N/A 03/20/2015   [id number] diverticulosis   CORONARY ARTERY BYPASS GRAFT  04/2004   LIMA to diagonal, SVG to ramus intermedius, SVG to PDA; placed stent for  crossed coronary arteries (Dr. Prescott Gum)   ESOPHAGOGASTRODUODENOSCOPY  2006   Dr. Kailah Pennel-non-critical Schatzki's ring, s/p 58-F Maloney dilation   ESOPHAGOGASTRODUODENOSCOPY  01/07/2012   Dr. Gareth Morgan ring, hiatal hernia, fundic gland polyp   ESOPHAGOGASTRODUODENOSCOPY N/A 03/20/2015   KWI:OXBDZ 1 varices/HH Gastric polpy s/p bx   rotater cuff     repair   TONSILLECTOMY      Prior to Admission medications   Medication Sig Start Date End Date Taking? Authorizing Provider  albuterol (PROVENTIL HFA;VENTOLIN HFA) 108 (90 Base) MCG/ACT inhaler Inhale into the lungs every 6 (six) hours as needed for wheezing or shortness of breath.   Yes [provider]  allopurinol (ZYLOPRIM) 100 MG tablet Take 100 mg by mouth at bedtime.    Yes [provider]  amLODipine (NORVASC) 5 MG tablet Take 1 tablet (5 mg total) by mouth daily. 03/12/17  Yes Hilty, Nadean Corwin, MD  apixaban (ELIQUIS) 5 MG TABS tablet Take 1 tablet (5 mg total) by mouth 2 (two) times daily. 01/24/16  Yes Hilty, Nadean Corwin, MD  finasteride (PROSCAR) 5 MG tablet Take 5 mg by mouth at bedtime. doesn't take daily   Yes [provider]  flunisolide (NASALIDE) 0.025 % SOLN Inhale 2 sprays into the lungs daily as needed (FOR CONGESTION).    Yes [provider]  gabapentin (NEURONTIN) 300 MG capsule Take 300 mg by mouth 3 (three) times daily.     Yes [provider]  hydrochlorothiazide (HYDRODIURIL) 25 MG  tablet Take 0.5 tablets (12.5 mg total) by mouth daily. 08/19/17  Yes Lendon Colonel, NP  Levothyroxine Sodium 112 MCG CAPS Take 112 mcg by mouth daily before breakfast.    Yes [provider]  losartan (COZAAR) 50 MG tablet Take 50 mg by mouth daily.   Yes [provider]  metoprolol tartrate (LOPRESSOR) 25 MG tablet Take 1 tablet (25 mg total) by mouth 2 (two) times daily. Patient taking differently: Take 12.5 mg by mouth 2 (two) times daily.  01/12/18  Yes Lendon Colonel, NP  RABEprazole (ACIPHEX) 20 MG tablet TAKE ONE TABLET BY MOUTH ONCE DAILY BEFORE BREAKFAST Patient taking differently: Take 20 mg by mouth every morning.  07/07/16  Yes Annitta Needs, NP  tamsulosin (FLOMAX) 0.4 MG CAPS capsule Take 0.4 mg by mouth at bedtime.    Yes [provider]  triamcinolone cream (KENALOG) 0.1 % Apply 1 application topically 2 (two) times daily as needed (FOR ITCHING/).   Yes [provider]  doxazosin (CARDURA) 8 MG tablet Take 8 mg by mouth at bedtime.     [provider]    Allergies as of 07/06/2018 - Review Complete 07/06/2018  Allergen Reaction Noted   Antihistamines, chlorpheniramine-type Rash 08/26/2011    Family History  Problem Relation Age of Onset   Heart attack Mother 61       deceased   Heart attack Father 30       deceased   Heart disease Brother        x2   Diabetes Brother        x3   Colon cancer Neg Hx     Social History   Socioeconomic History   Marital status: Married    Spouse name: Not on file   Number of children: Not on file   Years of education: Not on file   Highest education level: Not on file  Occupational History   Not on file  Social Needs   Financial resource strain: Not on file   Food insecurity:    Worry: Not on file    Inability: Not on file   Transportation needs:    Medical: Not on file    Non-medical: Not on file  Tobacco Use   Smoking status: Former Smoker    Packs/day: 1.00    Years: 40.00    Pack years: 40.00    Types: Cigarettes    Last attempt to quit: 04/07/2004    Years since quitting: 14.2   Smokeless tobacco: Never Used  Substance and Sexual Activity   Alcohol use: Yes    Comment: occassional drinker   Drug use: No   Sexual activity: Never  Lifestyle   Physical activity:    Days per week: Not on file    Minutes per session: Not on file   Stress: Not on file  Relationships   Social connections:    Talks on phone: Not on file     Gets together: Not on file    Attends religious service: Not on file    Active member of club or organization: Not on file    Attends meetings of clubs or organizations: Not on file    Relationship status: Not on file   Intimate partner violence:    Fear of current or ex partner: Not on file    Emotionally abused: Not on file    Physically abused: Not on file    Forced sexual activity: Not on file  Other Topics Concern   Not on file  Social History Narrative   Not on file    Review of Systems: See HPI, otherwise negative ROS  Physical Exam: BP (!) 148/56    Pulse 67    Temp 98.8 F (37.1 C) (Oral)    Ht 5' 9"  (1.753 m)    Wt 197 lb 9.6 oz (89.6 kg)    BMI 29.18 kg/m  General:   Alert,  W, pleasant and cooperative in NAD: Mentally sharp Lungs:  Clear throughout to auscultation.   No wheezes, crackles, or rhonchi. No acute distress. Heart:  Regular rate and rhythm; no murmurs, clicks, rubs,  or gallops. Abdomen: Non-distended, normal bowel sounds.  Soft and nontender without appreciable mass or hepatosplenomegaly.  Pulses:  Normal pulses noted. Extremities:  Without clubbing or edema.  Impression/Plan: Nash/cirrhosis.  Remains well compensated.  Known grade 1 esophageal varices.  Not on a nonselective beta-blocker.  Patient is not interested.  I explained the risks to him. Also, not interested in a follow-up ultrasound right now.  States that he will come back in the fall and get updated evaluation occluding ultrasound and EGD.  This is not unreasonable.  GERD well-controlled on rabeprazole   Recommendations: Plan to see him back in October to update ultrasound and EGD.  If any interim problems he is to let me know  Continue Rabeprazole 20 mg daily   Notice: This dictation was prepared with Dragon dictation along with smaller phrase technology. Any transcriptional errors that result from this process are unintentional and may not be corrected upon review.

## 2018-07-06 NOTE — Progress Notes (Signed)
Primary Care Physician:  Redmond School, MD Primary Gastroenterologist:  Dr. Gala Romney  Pre-Procedure History & Physical: HPI:  [name] is a 85 y.o. male here for follow-up of [name] cirrhosis.  Negative screening hepatic ultrasound Last year.  Due for 1 now.  Also history grade 1 esophageal varices patient does not want change out metoprolol for any other agents states metoprolol is "life-saving".  No dysphagia.  No melena rectal bleeding.  He remains very active chronically anticoagulated secondary to atrial fibrillation.  In fact, getting ready to play 9 holes of golf when he leaves this appointment.  No dysphagia.  Past Medical History:  Diagnosis Date   BPH (benign prostatic hyperplasia)    CAD (coronary artery disease) of artery bypass graft 2006   Fatty liver    GERD (gastroesophageal reflux disease)    Gilbert's syndrome    Hiatal hernia    History of nuclear stress test 11/2007   bruce myoview; normal pattern of persuion in all regions; post-stress EF 70%; low risk    HTN (hypertension)    Neuropathy    PAF (paroxysmal atrial fibrillation) ([facility])    PVD (peripheral vascular disease) ([facility])    0-49% R & L ICA stenosis (2013)    Renal artery aneurysm ([facility])    S/P CABG (coronary artery bypass graft) 2006   Schatzki's ring    non critical   Thrombocytopenia ([place])     Past Surgical History:  Procedure Laterality Date   CARDIOVERSION N/A 01/14/2018   Procedure: CARDIOVERSION;  Surgeon: [name], MD;  Location: [facility] ENDOSCOPY;  Service: Cardiovascular;  Laterality: N/A;   CATARACT EXTRACTION  2011   COLONOSCOPY  2006   Dr. Margarito Courser pancolonic diverticula   COLONOSCOPY  01/07/2012   Dr. Abbe Amsterdam normal rectum, pancolonic diverticulosis, hyperplastic polyp   COLONOSCOPY N/A 03/20/2015   [id number] diverticulosis   CORONARY ARTERY BYPASS GRAFT  04/2004   LIMA to diagonal, SVG to ramus intermedius, SVG to PDA; placed stent for  crossed coronary arteries (Dr. Prescott Gum)   ESOPHAGOGASTRODUODENOSCOPY  2006   Dr. Charrie Mcconnon-non-critical Schatzki's ring, s/p 58-F Maloney dilation   ESOPHAGOGASTRODUODENOSCOPY  01/07/2012   Dr. Gareth Morgan ring, hiatal hernia, fundic gland polyp   ESOPHAGOGASTRODUODENOSCOPY N/A 03/20/2015   ZCH:YIFOY 1 varices/HH Gastric polpy s/p bx   rotater cuff     repair   TONSILLECTOMY      Prior to Admission medications   Medication Sig Start Date End Date Taking? Authorizing Provider  albuterol (PROVENTIL HFA;VENTOLIN HFA) 108 (90 Base) MCG/ACT inhaler Inhale into the lungs every 6 (six) hours as needed for wheezing or shortness of breath.   Yes [provider]  allopurinol (ZYLOPRIM) 100 MG tablet Take 100 mg by mouth at bedtime.    Yes [provider]  amLODipine (NORVASC) 5 MG tablet Take 1 tablet (5 mg total) by mouth daily. 03/12/17  Yes Hilty, Nadean Corwin, MD  apixaban (ELIQUIS) 5 MG TABS tablet Take 1 tablet (5 mg total) by mouth 2 (two) times daily. 01/24/16  Yes Hilty, Nadean Corwin, MD  finasteride (PROSCAR) 5 MG tablet Take 5 mg by mouth at bedtime. doesn't take daily   Yes [provider]  flunisolide (NASALIDE) 0.025 % SOLN Inhale 2 sprays into the lungs daily as needed (FOR CONGESTION).    Yes [provider]  gabapentin (NEURONTIN) 300 MG capsule Take 300 mg by mouth 3 (three) times daily.     Yes [provider]  hydrochlorothiazide (HYDRODIURIL) 25 MG  tablet Take 0.5 tablets (12.5 mg total) by mouth daily. 08/19/17  Yes Lendon Colonel, NP  Levothyroxine Sodium 112 MCG CAPS Take 112 mcg by mouth daily before breakfast.    Yes [provider]  losartan (COZAAR) 50 MG tablet Take 50 mg by mouth daily.   Yes [provider]  metoprolol tartrate (LOPRESSOR) 25 MG tablet Take 1 tablet (25 mg total) by mouth 2 (two) times daily. Patient taking differently: Take 12.5 mg by mouth 2 (two) times daily.  01/12/18  Yes Lendon Colonel, NP  RABEprazole (ACIPHEX) 20 MG tablet TAKE ONE TABLET BY MOUTH ONCE DAILY BEFORE BREAKFAST Patient taking differently: Take 20 mg by mouth every morning.  07/07/16  Yes Annitta Needs, NP  tamsulosin (FLOMAX) 0.4 MG CAPS capsule Take 0.4 mg by mouth at bedtime.    Yes [provider]  triamcinolone cream (KENALOG) 0.1 % Apply 1 application topically 2 (two) times daily as needed (FOR ITCHING/).   Yes [provider]  doxazosin (CARDURA) 8 MG tablet Take 8 mg by mouth at bedtime.     [provider]    Allergies as of 07/06/2018 - Review Complete 07/06/2018  Allergen Reaction Noted   Antihistamines, chlorpheniramine-type Rash 08/26/2011    Family History  Problem Relation Age of Onset   Heart attack Mother 56       deceased   Heart attack Father 34       deceased   Heart disease Brother        x2   Diabetes Brother        x3   Colon cancer Neg Hx     Social History   Socioeconomic History   Marital status: Married    Spouse name: Not on file   Number of children: Not on file   Years of education: Not on file   Highest education level: Not on file  Occupational History   Not on file  Social Needs   Financial resource strain: Not on file   Food insecurity:    Worry: Not on file    Inability: Not on file   Transportation needs:    Medical: Not on file    Non-medical: Not on file  Tobacco Use   Smoking status: Former Smoker    Packs/day: 1.00    Years: 40.00    Pack years: 40.00    Types: Cigarettes    Last attempt to quit: 04/07/2004    Years since quitting: 14.2   Smokeless tobacco: Never Used  Substance and Sexual Activity   Alcohol use: Yes    Comment: occassional drinker   Drug use: No   Sexual activity: Never  Lifestyle   Physical activity:    Days per week: Not on file    Minutes per session: Not on file   Stress: Not on file  Relationships   Social connections:    Talks on phone: Not on file     Gets together: Not on file    Attends religious service: Not on file    Active member of club or organization: Not on file    Attends meetings of clubs or organizations: Not on file    Relationship status: Not on file   Intimate partner violence:    Fear of current or ex partner: Not on file    Emotionally abused: Not on file    Physically abused: Not on file    Forced sexual activity: Not on file  Other Topics Concern   Not on file  Social History Narrative   Not on file    Review of Systems: See HPI, otherwise negative ROS  Physical Exam: BP (!) 148/56    Pulse 67    Temp 98.8 F (37.1 C) (Oral)    Ht 5' 9"  (1.753 m)    Wt 197 lb 9.6 oz (89.6 kg)    BMI 29.18 kg/m  General:   Alert,  W, pleasant and cooperative in NAD: Mentally sharp Lungs:  Clear throughout to auscultation.   No wheezes, crackles, or rhonchi. No acute distress. Heart:  Regular rate and rhythm; no murmurs, clicks, rubs,  or gallops. Abdomen: Non-distended, normal bowel sounds.  Soft and nontender without appreciable mass or hepatosplenomegaly.  Pulses:  Normal pulses noted. Extremities:  Without clubbing or edema.  Impression/Plan: Nash/cirrhosis.  Remains well compensated.  Known grade 1 esophageal varices.  Not on a nonselective beta-blocker.  Patient is not interested.  I explained the risks to him. Also, not interested in a follow-up ultrasound right now.  States that he will come back in the fall and get updated evaluation occluding ultrasound and EGD.  This is not unreasonable.  GERD well-controlled on rabeprazole   Recommendations: Plan to see him back in October to update ultrasound and EGD.  If any interim problems he is to let me know  Continue Rabeprazole 20 mg daily   Notice: This dictation was prepared with Dragon dictation along with smaller phrase technology. Any transcriptional errors that result from this process are unintentional and may not be corrected upon review.

## 2018-07-06 NOTE — Progress Notes (Signed)
Primary Care Physician:  Redmond School, MD Primary Gastroenterologist:  Dr. Gala Romney  Pre-Procedure History & Physical: HPI:  [name] is a 85 y.o. male here for   Past Medical History:  Diagnosis Date  . BPH (benign prostatic hyperplasia)   . CAD (coronary artery disease) of artery bypass graft 2006  . Fatty liver   . GERD (gastroesophageal reflux disease)   . Gilbert's syndrome   . Hiatal hernia   . History of nuclear stress test 11/2007   bruce myoview; normal pattern of persuion in all regions; post-stress EF 70%; low risk   . HTN (hypertension)   . Neuropathy   . PAF (paroxysmal atrial fibrillation) ([place])   . PVD (peripheral vascular disease) ([place])    0-49% R & L ICA stenosis (2013)   . Renal artery aneurysm ([place])   . S/P CABG (coronary artery bypass graft) 2006  . Schatzki's ring    non critical  . Thrombocytopenia ([place])     Past Surgical History:  Procedure Laterality Date  . CARDIOVERSION N/A 01/14/2018   Procedure: CARDIOVERSION;  Surgeon: [name], MD;  Location: [facility] ENDOSCOPY;  Service: Cardiovascular;  Laterality: N/A;  . CATARACT EXTRACTION  2011  . COLONOSCOPY  2006   Dr. Margarito Courser pancolonic diverticula  . COLONOSCOPY  01/07/2012   Dr. Abbe Amsterdam normal rectum, pancolonic diverticulosis, hyperplastic polyp  . COLONOSCOPY N/A 03/20/2015   WRU:[id number] diverticulosis  . CORONARY ARTERY BYPASS GRAFT  04/2004   LIMA to diagonal, SVG to ramus intermedius, SVG to PDA; placed stent for crossed coronary arteries (Dr. Prescott Gum)  . ESOPHAGOGASTRODUODENOSCOPY  2006   Dr. Ruthell Rummage Schatzki's ring, s/p 58-F Maloney dilation  . ESOPHAGOGASTRODUODENOSCOPY  01/07/2012   Dr. Gareth Morgan ring, hiatal hernia, fundic gland polyp  . ESOPHAGOGASTRODUODENOSCOPY N/A 03/20/2015   JYN:WGNFA 1 varices/HH Gastric polpy s/p bx  . rotater cuff     repair  . TONSILLECTOMY      Prior to Admission medications   Medication Sig Start Date  End Date Taking? Authorizing Provider  albuterol (PROVENTIL HFA;VENTOLIN HFA) 108 (90 Base) MCG/ACT inhaler Inhale into the lungs every 6 (six) hours as needed for wheezing or shortness of breath.   Yes [provider]  allopurinol (ZYLOPRIM) 100 MG tablet Take 100 mg by mouth at bedtime.    Yes [provider]  amLODipine (NORVASC) 5 MG tablet Take 1 tablet (5 mg total) by mouth daily. 03/12/17  Yes Hilty, Nadean Corwin, MD  apixaban (ELIQUIS) 5 MG TABS tablet Take 1 tablet (5 mg total) by mouth 2 (two) times daily. 01/24/16  Yes Hilty, Nadean Corwin, MD  finasteride (PROSCAR) 5 MG tablet Take 5 mg by mouth at bedtime. doesn't take daily   Yes [provider]  flunisolide (NASALIDE) 0.025 % SOLN Inhale 2 sprays into the lungs daily as needed (FOR CONGESTION).    Yes [provider]  gabapentin (NEURONTIN) 300 MG capsule Take 300 mg by mouth 3 (three) times daily.     Yes [provider]  hydrochlorothiazide (HYDRODIURIL) 25 MG tablet Take 0.5 tablets (12.5 mg total) by mouth daily. 08/19/17  Yes Lendon Colonel, NP  Levothyroxine Sodium 112 MCG CAPS Take 112 mcg by mouth daily before breakfast.    Yes [provider]  losartan (COZAAR) 50 MG tablet Take 50 mg by mouth daily.   Yes [provider]  metoprolol tartrate (LOPRESSOR) 25 MG tablet Take 1 tablet (25 mg total) by mouth 2 (two) times daily.  Patient taking differently: Take 12.5 mg by mouth 2 (two) times daily.  01/12/18  Yes Lendon Colonel, NP  RABEprazole (ACIPHEX) 20 MG tablet TAKE ONE TABLET BY MOUTH ONCE DAILY BEFORE BREAKFAST Patient taking differently: Take 20 mg by mouth every morning.  07/07/16  Yes Annitta Needs, NP  tamsulosin (FLOMAX) 0.4 MG CAPS capsule Take 0.4 mg by mouth at bedtime.    Yes [provider]  triamcinolone cream (KENALOG) 0.1 % Apply 1 application topically 2 (two) times daily as needed (FOR ITCHING/).   Yes [provider]  doxazosin  (CARDURA) 8 MG tablet Take 8 mg by mouth at bedtime.     [provider]    Allergies as of 07/06/2018 - Review Complete 07/06/2018  Allergen Reaction Noted  . Antihistamines, chlorpheniramine-type Rash 08/26/2011    Family History  Problem Relation Age of Onset  . Heart attack Mother 57       deceased  . Heart attack Father 75       deceased  . Heart disease Brother        x2  . Diabetes Brother        x3  . Colon cancer Neg Hx     Social History   Socioeconomic History  . Marital status: Married    Spouse name: Not on file  . Number of children: Not on file  . Years of education: Not on file  . Highest education level: Not on file  Occupational History  . Not on file  Social Needs  . Financial resource strain: Not on file  . Food insecurity:    Worry: Not on file    Inability: Not on file  . Transportation needs:    Medical: Not on file    Non-medical: Not on file  Tobacco Use  . Smoking status: Former Smoker    Packs/day: 1.00    Years: 40.00    Pack years: 40.00    Types: Cigarettes    Last attempt to quit: 04/07/2004    Years since quitting: 14.2  . Smokeless tobacco: Never Used  Substance and Sexual Activity  . Alcohol use: Yes    Comment: occassional drinker  . Drug use: No  . Sexual activity: Never  Lifestyle  . Physical activity:    Days per week: Not on file    Minutes per session: Not on file  . Stress: Not on file  Relationships  . Social connections:    Talks on phone: Not on file    Gets together: Not on file    Attends religious service: Not on file    Active member of club or organization: Not on file    Attends meetings of clubs or organizations: Not on file    Relationship status: Not on file  . Intimate partner violence:    Fear of current or ex partner: Not on file    Emotionally abused: Not on file    Physically abused: Not on file    Forced sexual activity: Not on file  Other Topics Concern  . Not on file  Social  History Narrative  . Not on file    Review of Systems: See HPI, otherwise negative ROS  Physical Exam: BP (!) 148/56   Pulse 67   Temp 98.8 F (37.1 C) (Oral)   Ht 5' 9"  (1.753 m)   Wt 197 lb 9.6 oz (89.6 kg)   BMI 29.18 kg/m  General:   Alert,  Well-developed, well-nourished,  pleasant and cooperative in NAD Neck:  Supple; no masses or thyromegaly. No significant cervical adenopathy. Lungs:  Clear throughout to auscultation.   No wheezes, crackles, or rhonchi. No acute distress. Heart:  Regular rate and rhythm; no murmurs, clicks, rubs,  or gallops. Abdomen: Non-distended, normal bowel sounds.  Soft and nontender without appreciable mass or hepatosplenomegaly.  Pulses:  Normal pulses noted. Extremities:  Without clubbing or edema.  Impression/Plan:       Notice: This dictation was prepared with Dragon dictation along with smaller phrase technology. Any transcriptional errors that result from this process are unintentional and may not be corrected upon review.

## 2018-07-07 ENCOUNTER — Encounter: Payer: Self-pay | Admitting: Internal Medicine

## 2018-07-09 DIAGNOSIS — N138 Other obstructive and reflux uropathy: Secondary | ICD-10-CM | POA: Diagnosis not present

## 2018-07-19 ENCOUNTER — Telehealth: Payer: Self-pay | Admitting: Internal Medicine

## 2018-07-19 NOTE — Telephone Encounter (Signed)
Will speak with carolyn tomorrow to see where patient can be added on at

## 2018-07-19 NOTE — Telephone Encounter (Signed)
Symptoms have evolved since OV 2 weeks ago;  OK set him up for an EGD w ED soon; hold eliquis x 2 days.

## 2018-07-19 NOTE — Telephone Encounter (Signed)
noted 

## 2018-07-19 NOTE — Telephone Encounter (Signed)
Pt called this afternoon asking to be scheduled for an EGD. He seen RMR on 07/06/2018 and RMR recommendations were to follow up in OCT and schedule EGD then. Patient doesn't want to wait that long. He said he is burping and having gas every time he eats. I asked him if he had difficulty swallowing his food and he said not that often but sometimes. Please advise [phone]

## 2018-07-20 ENCOUNTER — Telehealth: Payer: Self-pay | Admitting: *Deleted

## 2018-07-20 ENCOUNTER — Other Ambulatory Visit: Payer: Self-pay | Admitting: *Deleted

## 2018-07-20 DIAGNOSIS — R1310 Dysphagia, unspecified: Secondary | ICD-10-CM

## 2018-07-20 NOTE — Telephone Encounter (Signed)
Pt is scheduled for his COVID screening on 07/23/2018.  Pt is aware to remain in quarantine once testing is done.  Pt voiced understanding.

## 2018-07-20 NOTE — Telephone Encounter (Signed)
Called patient. He is scheduled for 6/24 at 2pm. Patient aware will need to arrive at 1pm aat [place] short stay. I discussed in detail EGD instructions with patient and spouse. He is also aware he will need to have COVID-19 testing and will need to stay quarantined until after procedure.  Patient was then transferred to [facility] to schedule COVID-19 testing.

## 2018-07-20 NOTE — Telephone Encounter (Signed)
Patient and spouse also aware he needs to hold eliquis 2 days prior to procedure starting 07/26/2018.

## 2018-07-23 ENCOUNTER — Other Ambulatory Visit (HOSPITAL_COMMUNITY)
Admission: RE | Admit: 2018-07-23 | Discharge: 2018-07-23 | Disposition: A | Discharge status: Home / Self Care | Payer: Medicare Other | Source: Ambulatory Visit | Attending: Internal Medicine | Admitting: Internal Medicine

## 2018-07-23 ENCOUNTER — Other Ambulatory Visit: Payer: Self-pay

## 2018-07-23 DIAGNOSIS — Z1159 Encounter for screening for other viral diseases: Secondary | ICD-10-CM | POA: Diagnosis not present

## 2018-07-25 LAB — NOVEL CORONAVIRUS, NAA (HOSP ORDER, SEND-OUT TO REF LAB; TAT 18-24 HRS): SARS-CoV-2, NAA: NOT DETECTED

## 2018-07-28 ENCOUNTER — Ambulatory Visit (HOSPITAL_COMMUNITY)
Admission: RE | Admit: 2018-07-28 | Discharge: 2018-07-28 | Disposition: A | Discharge status: Home / Self Care | Payer: Medicare Other | Attending: Internal Medicine | Admitting: Internal Medicine

## 2018-07-28 ENCOUNTER — Encounter (HOSPITAL_COMMUNITY)
Admission: RE | Disposition: A | Discharge status: Home / Self Care | Payer: Self-pay | Source: Home / Self Care | Attending: Internal Medicine

## 2018-07-28 ENCOUNTER — Encounter (HOSPITAL_COMMUNITY): Payer: Self-pay

## 2018-07-28 ENCOUNTER — Other Ambulatory Visit: Payer: Self-pay

## 2018-07-28 DIAGNOSIS — I2510 Atherosclerotic heart disease of native coronary artery without angina pectoris: Secondary | ICD-10-CM | POA: Insufficient documentation

## 2018-07-28 DIAGNOSIS — I10 Essential (primary) hypertension: Secondary | ICD-10-CM | POA: Diagnosis not present

## 2018-07-28 DIAGNOSIS — Z87891 Personal history of nicotine dependence: Secondary | ICD-10-CM | POA: Diagnosis not present

## 2018-07-28 DIAGNOSIS — Z79899 Other long term (current) drug therapy: Secondary | ICD-10-CM | POA: Diagnosis not present

## 2018-07-28 DIAGNOSIS — K317 Polyp of stomach and duodenum: Secondary | ICD-10-CM | POA: Diagnosis not present

## 2018-07-28 DIAGNOSIS — I25810 Atherosclerosis of coronary artery bypass graft(s) without angina pectoris: Secondary | ICD-10-CM | POA: Diagnosis not present

## 2018-07-28 DIAGNOSIS — I8510 Secondary esophageal varices without bleeding: Secondary | ICD-10-CM | POA: Diagnosis not present

## 2018-07-28 DIAGNOSIS — K219 Gastro-esophageal reflux disease without esophagitis: Secondary | ICD-10-CM | POA: Diagnosis not present

## 2018-07-28 DIAGNOSIS — K7581 Nonalcoholic steatohepatitis (NASH): Secondary | ICD-10-CM | POA: Insufficient documentation

## 2018-07-28 DIAGNOSIS — R1310 Dysphagia, unspecified: Secondary | ICD-10-CM

## 2018-07-28 DIAGNOSIS — Z7901 Long term (current) use of anticoagulants: Secondary | ICD-10-CM | POA: Insufficient documentation

## 2018-07-28 DIAGNOSIS — I739 Peripheral vascular disease, unspecified: Secondary | ICD-10-CM | POA: Diagnosis not present

## 2018-07-28 DIAGNOSIS — I480 Paroxysmal atrial fibrillation: Secondary | ICD-10-CM | POA: Diagnosis not present

## 2018-07-28 DIAGNOSIS — N400 Enlarged prostate without lower urinary tract symptoms: Secondary | ICD-10-CM | POA: Insufficient documentation

## 2018-07-28 DIAGNOSIS — K7460 Unspecified cirrhosis of liver: Secondary | ICD-10-CM | POA: Diagnosis not present

## 2018-07-28 DIAGNOSIS — Z79890 Hormone replacement therapy (postmenopausal): Secondary | ICD-10-CM | POA: Diagnosis not present

## 2018-07-28 DIAGNOSIS — G629 Polyneuropathy, unspecified: Secondary | ICD-10-CM | POA: Diagnosis not present

## 2018-07-28 HISTORY — PX: ESOPHAGOGASTRODUODENOSCOPY: SHX5428

## 2018-07-28 HISTORY — PX: MALONEY DILATION: SHX5535

## 2018-07-28 SURGERY — EGD (ESOPHAGOGASTRODUODENOSCOPY)
Anesthesia: Moderate Sedation

## 2018-07-28 MED ORDER — MIDAZOLAM HCL 5 MG/5ML IJ SOLN
INTRAMUSCULAR | Status: AC
  Filled 2018-07-28: qty 10

## 2018-07-28 MED ORDER — ONDANSETRON HCL 4 MG/2ML IJ SOLN
INTRAMUSCULAR | Status: DC | PRN
  Administered 2018-07-28: 4 mg via INTRAVENOUS

## 2018-07-28 MED ORDER — MEPERIDINE HCL 50 MG/ML IJ SOLN
INTRAMUSCULAR | Status: AC
  Filled 2018-07-28: qty 1

## 2018-07-28 MED ORDER — MIDAZOLAM HCL 5 MG/5ML IJ SOLN
INTRAMUSCULAR | Status: DC | PRN
  Administered 2018-07-28: 1 mg via INTRAVENOUS
  Administered 2018-07-28: 2 mg via INTRAVENOUS
  Administered 2018-07-28: 1 mg via INTRAVENOUS

## 2018-07-28 MED ORDER — LIDOCAINE VISCOUS HCL 2 % MT SOLN
OROMUCOSAL | Status: DC | PRN
  Administered 2018-07-28: 4 mL via OROMUCOSAL

## 2018-07-28 MED ORDER — ONDANSETRON HCL 4 MG/2ML IJ SOLN
INTRAMUSCULAR | Status: AC
  Filled 2018-07-28: qty 2

## 2018-07-28 MED ORDER — SODIUM CHLORIDE 0.9 % IV SOLN
INTRAVENOUS | Status: DC
  Administered 2018-07-28: 13:00:00 via INTRAVENOUS

## 2018-07-28 MED ORDER — LIDOCAINE VISCOUS HCL 2 % MT SOLN
OROMUCOSAL | Status: AC
  Filled 2018-07-28: qty 15

## 2018-07-28 MED ORDER — MEPERIDINE HCL 100 MG/ML IJ SOLN
INTRAMUSCULAR | Status: DC | PRN
  Administered 2018-07-28: 25 mg via INTRAVENOUS
  Administered 2018-07-28: 15 mg via INTRAVENOUS

## 2018-07-28 MED ORDER — STERILE WATER FOR IRRIGATION IR SOLN
Status: DC | PRN
  Administered 2018-07-28: 14:00:00 2.5 mL

## 2018-07-28 NOTE — Discharge Instructions (Signed)
EGD Discharge instructions Please read the instructions outlined below and refer to this sheet in the next few weeks. These discharge instructions provide you with general information on caring for yourself after you leave the hospital. Your doctor may also give you specific instructions. While your treatment has been planned according to the most current medical practices available, unavoidable complications occasionally occur. If you have any problems or questions after discharge, please call your doctor. ACTIVITY  You may resume your regular activity but move at a slower pace for the next 24 hours.   Take frequent rest periods for the next 24 hours.   Walking will help expel (get rid of) the air and reduce the bloated feeling in your abdomen.   No driving for 24 hours (because of the anesthesia (medicine) used during the test).   You may shower.   Do not sign any important legal documents or operate any machinery for 24 hours (because of the anesthesia used during the test).  NUTRITION  Drink plenty of fluids.   You may resume your normal diet.   Begin with a light meal and progress to your normal diet.   Avoid alcoholic beverages for 24 hours or as instructed by your caregiver.  MEDICATIONS  You may resume your normal medications unless your caregiver tells you otherwise.  WHAT YOU CAN EXPECT TODAY  You may experience abdominal discomfort such as a feeling of fullness or gas pains.  FOLLOW-UP  Your doctor will discuss the results of your test with you.  SEEK IMMEDIATE MEDICAL ATTENTION IF ANY OF THE FOLLOWING OCCUR:  Excessive nausea (feeling sick to your stomach) and/or vomiting.   Severe abdominal pain and distention (swelling).   Trouble swallowing.   Temperature over 101 F (37.8 C).   Rectal bleeding or vomiting of blood.    Gastroesophageal Reflux Disease, Adult Gastroesophageal reflux (GER) happens when acid from the stomach flows up into the tube that  connects the mouth and the stomach (esophagus). Normally, food travels down the esophagus and stays in the stomach to be digested. With GER, food and stomach acid sometimes move back up into the esophagus. You may have a disease called gastroesophageal reflux disease (GERD) if the reflux:  Happens often.  Causes frequent or very bad symptoms.  Causes problems such as damage to the esophagus. When this happens, the esophagus becomes sore and swollen (inflamed). Over time, GERD can make small holes (ulcers) in the lining of the esophagus. What are the causes? This condition is caused by a problem with the muscle between the esophagus and the stomach. When this muscle is weak or not normal, it does not close properly to keep food and acid from coming back up from the stomach. The muscle can be weak because of:  Tobacco use.  Pregnancy.  Having a certain type of hernia (hiatal hernia).  Alcohol use.  Certain foods and drinks, such as coffee, chocolate, onions, and peppermint. What increases the risk? You are more likely to develop this condition if you:  Are overweight.  Have a disease that affects your connective tissue.  Use NSAID medicines. What are the signs or symptoms? Symptoms of this condition include:  Heartburn.  Difficult or painful swallowing.  The feeling of having a lump in the throat.  A bitter taste in the mouth.  Bad breath.  Having a lot of saliva.  Having an upset or bloated stomach.  Belching.  Chest pain. Different conditions can cause chest pain. Make sure you see  your doctor if you have chest pain.  Shortness of breath or noisy breathing (wheezing).  Ongoing (chronic) cough or a cough at night.  Wearing away of the surface of teeth (tooth enamel).  Weight loss. How is this treated? Treatment will depend on how bad your symptoms are. Your doctor may suggest:  Changes to your diet.  Medicine.  Surgery. Follow these instructions at  home: Eating and drinking   Follow a diet as told by your doctor. You may need to avoid foods and drinks such as: ? Coffee and tea (with or without caffeine). ? Drinks that contain alcohol. ? Energy drinks and sports drinks. ? Bubbly (carbonated) drinks or sodas. ? Chocolate and cocoa. ? Peppermint and mint flavorings. ? Garlic and onions. ? Horseradish. ? Spicy and acidic foods. These include peppers, chili powder, curry powder, vinegar, hot sauces, and BBQ sauce. ? Citrus fruit juices and citrus fruits, such as oranges, lemons, and limes. ? Tomato-based foods. These include red sauce, chili, salsa, and pizza with red sauce. ? Fried and fatty foods. These include donuts, french fries, potato chips, and high-fat dressings. ? High-fat meats. These include hot dogs, rib eye steak, sausage, ham, and bacon. ? High-fat dairy items, such as whole milk, butter, and cream cheese.  Eat small meals often. Avoid eating large meals.  Avoid drinking large amounts of liquid with your meals.  Avoid eating meals during the 2-3 hours before bedtime.  Avoid lying down right after you eat.  Do not exercise right after you eat. Lifestyle   Do not use any products that contain nicotine or tobacco. These include cigarettes, e-cigarettes, and chewing tobacco. If you need help quitting, ask your doctor.  Try to lower your stress. If you need help doing this, ask your doctor.  If you are overweight, lose an amount of weight that is healthy for you. Ask your doctor about a safe weight loss goal. General instructions  Pay attention to any changes in your symptoms.  Take over-the-counter and prescription medicines only as told by your doctor. Do not take aspirin, ibuprofen, or other NSAIDs unless your doctor says it is okay.  Wear loose clothes. Do not wear anything tight around your waist.  Raise (elevate) the head of your bed about 6 inches (15 cm).  Avoid bending over if this makes your  symptoms worse.  Keep all follow-up visits as told by your doctor. This is important. Contact a doctor if:  You have new symptoms.  You lose weight and you do not know why.  You have trouble swallowing or it hurts to swallow.  You have wheezing or a cough that keeps happening.  Your symptoms do not get better with treatment.  You have a hoarse voice. Get help right away if:  You have pain in your arms, neck, jaw, teeth, or back.  You feel sweaty, dizzy, or light-headed.  You have chest pain or shortness of breath.  You throw up (vomit) and your throw-up looks like blood or coffee grounds.  You pass out (faint).  Your poop (stool) is bloody or black.  You cannot swallow, drink, or eat. Summary  If a person has gastroesophageal reflux disease (GERD), food and stomach acid move back up into the esophagus and cause symptoms or problems such as damage to the esophagus.  Treatment will depend on how bad your symptoms are.  Follow a diet as told by your doctor.  Take all medicines only as told by your doctor. This information  is not intended to replace advice given to you by your health care provider. Make sure you discuss any questions you have with your health care provider. Document Released: 07/09/2007 Document Revised: 07/29/2017 Document Reviewed: 07/29/2017 Elsevier Interactive Patient Education  2019 [employer].   GERD information provided  Increase rabeprazole to 20 mg twice daily  Office visit with [place] in 3 months  Blood Pressure running a little high today;   recheck it later in the week with your primary care physician  Resume Eliquis today  Discussed my findings and recommendations with the patient's wife, Vaughan Basta, at [phone]

## 2018-07-28 NOTE — Interval H&P Note (Signed)
History and Physical Interval Note:  07/28/2018 1:59 PM  Ashby Dawes Streety  has presented today for surgery, with the diagnosis of dysphagia.  The various methods of treatment have been discussed with the patient and family. After consideration of risks, benefits and other options for treatment, the patient has consented to  Procedure(s) with comments: ESOPHAGOGASTRODUODENOSCOPY (EGD) (N/A) - 2:00pm [place] (N/A) as a surgical intervention.  The patient's history has been reviewed, patient examined, no change in status, stable for surgery.  I have reviewed the patient's chart and labs.  Questions were answered to the patient's satisfaction.     Manus Rudd  Patient called back after he was seen earlier this month stating he was have some difficulties getting things to go down some issues with dysphagia and some issues with regurgitation.  Rabeprazole 20 mg daily may not be adequate.  We will offer the patient an EGD today with possible esophageal dilation as feasible/appropriate per plan.  Eliquis held 3 days ago. The risks, benefits, limitations, alternatives and imponderables have been reviewed with the patient. Potential for esophageal dilation, biopsy, etc. have also been reviewed.  Questions have been answered. All parties agreeable.

## 2018-07-28 NOTE — Op Note (Signed)
[facility] Patient Name: [name] Procedure Date: 07/28/2018 1:54 PM MRN: [id number] Date of Birth: Jun 12, 1933 Attending MD: Norvel Richards , MD CSN: [id number] Age: 85 Admit Type: Outpatient Procedure:                Upper GI endoscopy Indications:              Dysphagia, Suspected gastro-esophageal reflux                            disease Providers:                Norvel Richards, MD, Jeanann Lewandowsky. Sharon Seller, RN,                            Nelma Rothman, Technician Referring MD:              Medicines:                Midazolam 4 mg IV, Meperidine 25 mg IV Complications:            No immediate complications. Estimated Blood Loss:     Estimated blood loss was minimal. Procedure:                Pre-Anesthesia Assessment:                           - Prior to the procedure, a History and Physical                            was performed, and patient medications and                            allergies were reviewed. The patient's tolerance of                            previous anesthesia was also reviewed. The risks                            and benefits of the procedure and the sedation                            options and risks were discussed with the patient.                            All questions were answered, and informed consent                            was obtained. Prior Anticoagulants: The patient                            last took Eliquis (apixaban) 3 days prior to the                            procedure. ASA Grade Assessment: III - A patient  with severe systemic disease. After reviewing the                            risks and benefits, the patient was deemed in                            satisfactory condition to undergo the procedure.                           After obtaining informed consent, the endoscope was                            passed under direct vision. Throughout the                            procedure, the  patient's blood pressure, pulse, and                            oxygen saturations were monitored continuously. The                            GIF-H190 ([id number]) was introduced through the                            mouth, and advanced to the second part of duodenum.                            The upper GI endoscopy was accomplished without                            difficulty. The patient tolerated the procedure                            well. Scope In: 2:12:38 PM Scope Out: 2:19:35 PM Total Procedure Duration: 0 hours 6 minutes 57 seconds  Findings:      The examined esophagus was normal aside from 3 short columns of grade 1       esophageal varices. The scope was withdrawn. Dilation was performed with       a Maloney dilator with mild resistance at 56 Fr. The dilation site was       examined following endoscope reinsertion and showed mild mucosal       disruption. Estimated blood loss was minimal.      Multiple pedunculated and sessile polyp was found in the stomach       (previously biopsied and proven to be benign)      The duodenal bulb and second portion of the duodenum were normal. Impression:               - Normal esophagus. Grade 1 esophageal varices.                            Status post esophageal dilation.                           Multiple gastric polyps?"chronic finding. Not  manipulated.                           - Normal duodenal bulb and second portion of the                            duodenum.                           - No specimens collected. Moderate Sedation:      Moderate (conscious) sedation was administered by the endoscopy nurse       and supervised by the endoscopist. The following parameters were       monitored: oxygen saturation, heart rate, blood pressure, respiratory       rate, EKG, adequacy of pulmonary ventilation, and response to care.       Total physician intraservice time was 15 minutes. Recommendation:            - Patient has a contact number available for                            emergencies. The signs and symptoms of potential                            delayed complications were discussed with the                            patient. Return to normal activities tomorrow.                            Written discharge instructions were provided to the                            patient.                           - Resume previous diet. Increase rabeprazole to 20                            mg twice daily.                           - Resume Eliquis (apixaban) at prior dose today.                           - Return to GI clinic in 3 months. Procedure Code(s):        --- Professional ---                           [phone], Esophagogastroduodenoscopy, flexible,                            transoral; diagnostic, including collection of                            specimen(s) by brushing or washing, when performed                            (  separate procedure)                           43450, Dilation of esophagus, by unguided sound or                            bougie, single or multiple passes                           G0500, Moderate sedation services provided by the                            same physician or other qualified health care                            professional performing a gastrointestinal                            endoscopic service that sedation supports,                            requiring the presence of an independent trained                            observer to assist in the monitoring of the                            patient's level of consciousness and physiological                            status; initial 15 minutes of intra-service time;                            patient age 14 years or older (additional time may                            be reported with [phone], as appropriate) Diagnosis Code(s):        --- Professional ---                           K31.7, Polyp of  stomach and duodenum                           R13.10, Dysphagia, unspecified CPT copyright 2019 American Medical Association. All rights reserved. The codes documented in this report are preliminary and upon coder review may  be revised to meet current compliance requirements. Cristopher Estimable. Kamaiyah Uselton, MD Norvel Richards, MD 07/28/2018 2:31:52 PM This report has been signed electronically. Number of Addenda: 0

## 2018-08-03 ENCOUNTER — Encounter (HOSPITAL_COMMUNITY): Payer: Self-pay | Admitting: Internal Medicine

## 2018-08-17 ENCOUNTER — Telehealth: Payer: Self-pay | Admitting: Internal Medicine

## 2018-08-17 NOTE — Telephone Encounter (Signed)
Spoke with the [facility], pt's medication Rabeprazole is due to be shipped out Tomorrow. Pt is aware.

## 2018-08-17 NOTE — Telephone Encounter (Signed)
Spoke with pt. He would like [place] to call the [place] for his Rabeprazole RX. Pt faxed his RX which was given by RMR after his procedure. Will call [facility].

## 2018-08-17 NOTE — Telephone Encounter (Signed)
Pt has questions about his medications. Please call [phone]

## 2018-09-09 NOTE — Telephone Encounter (Signed)
Spoke with the [place], pt's medication Rabeprazole was mailed to him on 07/26/2018 and the next shipment isn't suppose to go out until 10/2018. 90 day supply was sent, one pill once daily. Spoke with pt's spouse, pt mentioned he's suppose to take this medication bid and in our records it says once daily. Waiting on a return call from pt.

## 2018-09-09 NOTE — Telephone Encounter (Signed)
Spoke with pt and he gave a number that I can call for the [place] [phone].HQIX call them to see why pt hasn't received his medication.

## 2018-09-09 NOTE — Telephone Encounter (Signed)
Patient called and he has not received his medication yet. He states they still have him taking it once a day and not BID. He states [facility].

## 2018-09-10 ENCOUNTER — Other Ambulatory Visit: Payer: Self-pay

## 2018-09-10 ENCOUNTER — Telehealth: Payer: Self-pay | Admitting: Internal Medicine

## 2018-09-10 MED ORDER — RABEPRAZOLE SODIUM 20 MG PO TBEC: 20.0000 mg | DELAYED_RELEASE_TABLET | Freq: Two times a day (BID) | ORAL | 3 refills | Status: DC

## 2018-09-10 NOTE — Telephone Encounter (Signed)
Spoke with [name]. New RX was sent to [facility]. Per RMR's notes from pts procedure, [name] should increase Rabeprazole 20 mg bid

## 2018-09-10 NOTE — Telephone Encounter (Signed)
Pt was returning a call. [phone]

## 2018-10-18 ENCOUNTER — Telehealth: Payer: Self-pay | Admitting: Internal Medicine

## 2018-10-18 NOTE — Telephone Encounter (Signed)
Letter mailed

## 2018-10-18 NOTE — Telephone Encounter (Signed)
Recall for ultrasound 

## 2018-11-03 ENCOUNTER — Other Ambulatory Visit: Payer: Self-pay | Admitting: Internal Medicine

## 2018-11-03 ENCOUNTER — Telehealth: Payer: Self-pay | Admitting: Internal Medicine

## 2018-11-03 ENCOUNTER — Other Ambulatory Visit: Payer: Self-pay | Admitting: Adult Health

## 2018-11-03 NOTE — Telephone Encounter (Signed)
LMOVM for pt. We do not have anything from [place] on pt.

## 2018-11-03 NOTE — Telephone Encounter (Signed)
PATIENT CALLED ASKING IF [facility] [employer] SENT INFORMATION ABOUT TESTS HE IS SUPPOSED TO HAVE?

## 2018-11-04 ENCOUNTER — Telehealth: Payer: Self-pay | Admitting: Internal Medicine

## 2018-11-04 DIAGNOSIS — Z23 Encounter for immunization: Secondary | ICD-10-CM | POA: Diagnosis not present

## 2018-11-04 DIAGNOSIS — Z1389 Encounter for screening for other disorder: Secondary | ICD-10-CM | POA: Diagnosis not present

## 2018-11-04 DIAGNOSIS — K7460 Unspecified cirrhosis of liver: Secondary | ICD-10-CM

## 2018-11-04 DIAGNOSIS — Z6827 Body mass index (BMI) 27.0-27.9, adult: Secondary | ICD-10-CM | POA: Diagnosis not present

## 2018-11-04 DIAGNOSIS — I10 Essential (primary) hypertension: Secondary | ICD-10-CM | POA: Diagnosis not present

## 2018-11-04 DIAGNOSIS — K7581 Nonalcoholic steatohepatitis (NASH): Secondary | ICD-10-CM

## 2018-11-04 DIAGNOSIS — Z0001 Encounter for general adult medical examination with abnormal findings: Secondary | ICD-10-CM | POA: Diagnosis not present

## 2018-11-04 DIAGNOSIS — F419 Anxiety disorder, unspecified: Secondary | ICD-10-CM | POA: Diagnosis not present

## 2018-11-04 DIAGNOSIS — E7849 Other hyperlipidemia: Secondary | ICD-10-CM | POA: Diagnosis not present

## 2018-11-04 NOTE — Telephone Encounter (Signed)
I received a fax from the [place] saying that the patient is requesting an U/S of his liver through the [place]. He states he has been doing this for years with our practice. [facility] is requesting orders along with 1 progress note. I have faxed them the last OV note and U/S reports. Please advise if there's anything else I need to do. Patient has OV with [place] next week.

## 2018-11-04 NOTE — Telephone Encounter (Signed)
Order for RUQ faxed to [facility]

## 2018-11-09 NOTE — Progress Notes (Signed)
Primary Care Physician: Redmond School, MD  Primary Gastroenterologist:  Garfield Cornea, MD   Chief Complaint  Patient presents with  . Gastroesophageal Reflux  . Cirrhosis  . Gas    worse with eating or even drinking liquids    HPI: [name] is a 85 y.o. male here for follow up. H/O NASH cirrhosis.   EGD 07/2018: 3 short columns of grade 1 esophageal varices. Esophageal dilation performed for dysphagia. Multiple gastric polyps previously proved benign. Did not want to change metoprolol to a nonselective beta blocker because metoprolol is "life-saving". Due for labs and u/s (hepatoma screening).  Reports recent labs through the [place] and PCP.  Overall feeling okay.  His dysphagia is better status post dilation.  No significant reflux.  No abdominal pain.  Bowel movements regular.  Complains of postprandial gas.  Almost as soon as he starts eating he has malodorous flatulence.  This is a new symptom for him.  Not associated with any pain or change in bowel movements.  Recently was told he was anemic.  He has a history of chronic anemia.  We will obtain recent labs for comparison.  Awaiting approval from the [place] to get his right upper quadrant ultrasound here locally.   Current Outpatient Medications  Medication Sig Dispense Refill  . albuterol (PROVENTIL HFA;VENTOLIN HFA) 108 (90 Base) MCG/ACT inhaler Inhale 1-2 puffs into the lungs every 6 (six) hours as needed for wheezing or shortness of breath.     . allopurinol (ZYLOPRIM) 100 MG tablet Take 100 mg by mouth at bedtime.     . Alpha-D-Galactosidase (BEANO ULTRA 800) TABS Take 1 tablet by mouth daily as needed (gas).    Marland Kitchen amLODipine (NORVASC) 10 MG tablet Take 5 mg by mouth daily.    Marland Kitchen apixaban (ELIQUIS) 5 MG TABS tablet Take 1 tablet (5 mg total) by mouth 2 (two) times daily. 180 tablet 3  . finasteride (PROSCAR) 5 MG tablet Take 5 mg by mouth at bedtime.     . fluticasone (FLONASE) 50 MCG/ACT nasal spray Place 1 spray  into both nostrils 2 (two) times a day.    . gabapentin (NEURONTIN) 300 MG capsule Take 300 mg by mouth 3 (three) times daily.      . hydrochlorothiazide (HYDRODIURIL) 25 MG tablet Take 0.5 tablets (12.5 mg total) by mouth daily. *NEEDS OFFICE VISIT FOR FURTHER REFILLS* 15 tablet 0  . Levothyroxine Sodium 112 MCG CAPS Take 112 mcg by mouth daily before breakfast.     . losartan (COZAAR) 100 MG tablet Take 50 mg by mouth daily.    . metoprolol tartrate (LOPRESSOR) 25 MG tablet Take 1 tablet (25 mg total) by mouth 2 (two) times daily. *NEEDS OFFICE VISIT FOR FURTHER REFILLS* 60 tablet 0  . RABEprazole (ACIPHEX) 20 MG tablet Take 1 tablet (20 mg total) by mouth 2 (two) times daily. 180 tablet 3  . tamsulosin (FLOMAX) 0.4 MG CAPS capsule Take 0.4 mg by mouth at bedtime.     . triamcinolone cream (KENALOG) 0.1 % Apply 1 application topically 2 (two) times daily as needed (FOR ITCHING/).     No current facility-administered medications for this visit.     Allergies as of 11/10/2018 - Review Complete 11/10/2018  Allergen Reaction Noted  . Antihistamines, chlorpheniramine-type Rash 08/26/2011    ROS:  General: Negative for anorexia, weight loss, fever, chills, fatigue, weakness. ENT: Negative for hoarseness, difficulty swallowing , nasal congestion. CV: Negative for chest pain, angina, palpitations,  dyspnea on exertion, peripheral edema.  Respiratory: Negative for dyspnea at rest, dyspnea on exertion, cough, sputum, wheezing.  GI: See history of present illness. GU:  Negative for dysuria, hematuria, urinary incontinence, urinary frequency, nocturnal urination.  Endo: Negative for unusual weight change.    Physical Examination:   BP (!) 144/55   Pulse (!) 58   Temp (!) 97 F (36.1 C) (Oral)   Ht 5' 9"  (1.753 m)   Wt 191 lb (86.6 kg)   BMI 28.21 kg/m   General: Well-nourished, well-developed in no acute distress.  Eyes: No icterus. Mouth: Oropharyngeal mucosa moist and pink , no  lesions erythema or exudate. Lungs: Clear to auscultation bilaterally.  Heart: Regular rate and rhythm, no murmurs rubs or gallops.  Abdomen: Bowel sounds are normal, nontender, nondistended, no hepatosplenomegaly or masses, no abdominal bruits or hernia , no rebound or guarding.   Extremities: No lower extremity edema. No clubbing or deformities. Neuro: Alert and oriented x 4   Skin: Warm and dry, no jaundice.   Psych: Alert and cooperative, normal mood and affect.  Labs:  Quested Imaging Studies: No results found.

## 2018-11-10 ENCOUNTER — Encounter: Payer: Self-pay | Admitting: Gastroenterology

## 2018-11-10 ENCOUNTER — Other Ambulatory Visit: Payer: Self-pay

## 2018-11-10 ENCOUNTER — Ambulatory Visit: Payer: Medicare Other | Admitting: Gastroenterology

## 2018-11-10 VITALS — BP 144/55 | HR 58 | Temp 97.0°F | Ht 69.0 in | Wt 191.0 lb

## 2018-11-10 DIAGNOSIS — R143 Flatulence: Secondary | ICD-10-CM | POA: Diagnosis not present

## 2018-11-10 DIAGNOSIS — K7460 Unspecified cirrhosis of liver: Secondary | ICD-10-CM | POA: Diagnosis not present

## 2018-11-10 DIAGNOSIS — K219 Gastro-esophageal reflux disease without esophagitis: Secondary | ICD-10-CM | POA: Diagnosis not present

## 2018-11-10 NOTE — Patient Instructions (Signed)
1. Trial of Restora one daily for digestive issues. Samples provided. It is best to take a probiotic about four weeks to determine if helping your gas issues. You can get started with Restora. Once you complete samples, you may purchase OTC probiotic such as Tour manager. 2. We will obtain copy of your latest labs for review. 3. We will check on status of [facility] with the [facility].  4. Return to the office in six months.

## 2018-11-11 NOTE — Assessment & Plan Note (Signed)
No change in bowel habits.  No abdominal pain.  Trial of probiotic for 4 weeks.  Samples of Restora provided, 14 days.  He can complete 4-week trial of probiotics with over-the-counter regimens such as Align or Hardin Negus' colon health.

## 2018-11-11 NOTE — Assessment & Plan Note (Addendum)
Nash cirrhosis.  Remains well compensated.  Known grade 1 esophageal varices.  Patient has not been interested in nonselective beta-blocker.  Due hepatoma surveillance.  He would like to have his right upper quadrant ultrasound done locally, we are awaiting approval through the [place].  He reports recent labs through the [place] and PCP.  We have requested records.  Return to the office in 6 months.

## 2018-11-12 ENCOUNTER — Telehealth: Payer: Self-pay | Admitting: *Deleted

## 2018-11-12 NOTE — Telephone Encounter (Signed)
[name] spoke with [name] at the [place]. Patient will need to follow up with the Dr. Napoleon Form at the [facility] to see if they will give authorization for the [place].   Called pt-LMOVM  FYI to LSL

## 2018-11-12 NOTE — Telephone Encounter (Signed)
Patient called back and made aware. He will f[facility]u with the [facility]. He is too call us so we can schedule [place] once he does this.

## 2018-11-17 NOTE — Telephone Encounter (Signed)
noted 

## 2018-11-18 ENCOUNTER — Telehealth: Payer: Self-pay

## 2018-11-18 ENCOUNTER — Other Ambulatory Visit: Payer: Self-pay | Admitting: Gastroenterology

## 2018-11-18 DIAGNOSIS — R143 Flatulence: Secondary | ICD-10-CM

## 2018-11-18 MED ORDER — RESTORA PO CAPS: 1.0000 | ORAL_CAPSULE | Freq: Every day | ORAL | 11 refills | Status: DC

## 2018-11-18 NOTE — Telephone Encounter (Signed)
Pt called office, he checked on getting Restora but was told he would need rx. He requests rx be sent to [facility] in [place].

## 2018-11-18 NOTE — Progress Notes (Signed)
Restora Rx sent to pharmacy

## 2018-11-18 NOTE — Telephone Encounter (Signed)
Rx has been sent.  Of note this could be expensive as our system reports not reimbursable.  At last visit, [name] stated patient could try other OTC probiotics such as [place] after completing Restora samples.  If prescription is too expensive, he can try these other over-the-counter agents.

## 2018-11-19 NOTE — Telephone Encounter (Signed)
Received fax from Henderson at the [place]. Once [place] is scheduled to call her at [phone]. [place] scheduled for 10/21 at 9:30am, arrival 9:15am, npo midnight. Called Heather and made aware of appt details. She will get the authorization faxed over to [place].  Called patient and made aware of appt details. He voiced understanding. Nothing further needed.

## 2018-11-19 NOTE — Telephone Encounter (Signed)
[facility] auth# [id number] exp 10/21/20221

## 2018-11-19 NOTE — Telephone Encounter (Signed)
Called and informed pt.  

## 2018-11-24 ENCOUNTER — Other Ambulatory Visit: Payer: Self-pay

## 2018-11-24 ENCOUNTER — Ambulatory Visit (HOSPITAL_COMMUNITY)
Admission: RE | Admit: 2018-11-24 | Discharge: 2018-11-24 | Disposition: A | Discharge status: Home / Self Care | Payer: Medicare Other | Source: Ambulatory Visit | Attending: Internal Medicine | Admitting: Internal Medicine

## 2018-11-24 DIAGNOSIS — K7581 Nonalcoholic steatohepatitis (NASH): Secondary | ICD-10-CM | POA: Insufficient documentation

## 2018-11-24 DIAGNOSIS — K7460 Unspecified cirrhosis of liver: Secondary | ICD-10-CM | POA: Diagnosis not present

## 2018-12-06 ENCOUNTER — Telehealth: Payer: Self-pay | Admitting: Gastroenterology

## 2018-12-06 NOTE — Telephone Encounter (Signed)
Records reviewed from [facility] dated February 2020: Glucose 103, BUN 15, creatinine 1.12, albumin 3.8, total bilirubin 1.3, alk phos 90, AST 40, ALT 32, albumin 3.8, TSH 0.398, B12 303, white blood cell count 4640, hemoglobin 11.5, hematocrit 33.4, MCV 99.4, platelets 144,000  Labs from PCP dated 11/04/2018: Hemoglobin 11.5, hematocrit 32.7, MCV 97, platelets 168,000, BUN 13, creatinine 1.12, albumin 4.3, total bilirubin 0.9, alk phos 105, AST 40, ALT 21, albumin 4.3, TSH 1.5.  Please let patient know that I have reviewed his labs from PCP recently as well as back in February done at [facility].  He has mild, stable normocytic anemia.  Last EGD June 2020.  Last colonoscopy February 2017.  At this time he needs to complete and I FOBT, recommend repeat CBC along with iron/TIBC/ferritin

## 2018-12-07 ENCOUNTER — Other Ambulatory Visit: Payer: Self-pay

## 2018-12-07 DIAGNOSIS — D649 Anemia, unspecified: Secondary | ICD-10-CM

## 2018-12-07 NOTE — Telephone Encounter (Signed)
Pt notified that LSL has reviewed his labs from PCP. Pt will pick up IFOBT kit and return it as soon as he can. Instructions were given over the phone on how to complete the lab. Lab orders were also placed in the bag with the IFOBT kit. Pt will complete labs as well.

## 2018-12-09 ENCOUNTER — Telehealth: Payer: Self-pay | Admitting: *Deleted

## 2018-12-09 NOTE — Telephone Encounter (Signed)
Pt says he recently had blood work done at the [place].  He doesn't think he needs more.  [phone] or [phone]

## 2018-12-09 NOTE — Telephone Encounter (Signed)
Noted. Pt notified that he can try [employer] colon health or Dig[employer]age.

## 2018-12-09 NOTE — Telephone Encounter (Signed)
He can try [place] or Digestive Advantage.

## 2018-12-09 NOTE — Telephone Encounter (Signed)
Spoke with pt. Pt is aware that the labs LSL reviewed the labs from his PCP and the [place]. Pt was advised to have labs & IFOBT done per LSL.   Pt would like to know the names of other Probiotics he can take. The Restora RX was called into his pharmacy and is on back order. Pt is taking Align and feels it doesn't do well.

## 2019-01-03 ENCOUNTER — Encounter: Payer: Self-pay | Admitting: Internal Medicine

## 2019-01-03 ENCOUNTER — Other Ambulatory Visit: Payer: Self-pay

## 2019-01-03 ENCOUNTER — Ambulatory Visit: Payer: Medicare Other | Admitting: Internal Medicine

## 2019-01-03 VITALS — BP 151/59 | HR 61 | Temp 96.9°F | Ht 69.0 in | Wt 197.8 lb

## 2019-01-03 DIAGNOSIS — I480 Paroxysmal atrial fibrillation: Secondary | ICD-10-CM

## 2019-01-03 DIAGNOSIS — Z951 Presence of aortocoronary bypass graft: Secondary | ICD-10-CM

## 2019-01-03 DIAGNOSIS — I2510 Atherosclerotic heart disease of native coronary artery without angina pectoris: Secondary | ICD-10-CM | POA: Diagnosis not present

## 2019-01-03 DIAGNOSIS — E7800 Pure hypercholesterolemia, unspecified: Secondary | ICD-10-CM | POA: Diagnosis not present

## 2019-01-03 DIAGNOSIS — N401 Enlarged prostate with lower urinary tract symptoms: Secondary | ICD-10-CM

## 2019-01-03 DIAGNOSIS — N138 Other obstructive and reflux uropathy: Secondary | ICD-10-CM

## 2019-01-03 NOTE — Progress Notes (Signed)
OFFICE NOTE  Chief Complaint:  No complaints  Primary Care Physician: Redmond School, MD  HPI:  [name] is a 85 year old gentleman who had bypass in 2006, a normal nuclear study in 2009 and has done well without any episodes of angina. He has remained active and does exercise several times a week and has had no worsening shortness of breath, palpitations, presyncope or syncopal symptoms. He also has a history of right carotid disease which he was told to be about 60% at the time of bypass, however, recent Dopplers show only mild disease. He does have a very faint bruit. He last saw Tarri Fuller, PA-C, in the office this past summer. He was having problems with elevated blood pressures at night. It was recommended that he change his blood pressure medications around, however he did not make that change.  He was recently admitted to [facility] for dehydration in the setting of pneumonia or viral influenza. He was taking his diuretics in addition to ongoing fluid losses. This cause orthostatic hypotension. His diuretic was held and his lisinopril was stopped due to worsening renal function. He was switched to amlodipine for better blood pressure control, but has not been taking the medicine due to confusion of his medicines. Currently reports his blood pressures have improved and is not have any significant swelling. In fact he is now hypertensive again. He is recently switched from warfarin over to Xarelto for better control of anticoagulation for his A. fib. He seems to be able to get the medication now with out undue cost.  [name] turns today for followup. He occasionally gets some lightheadedness. He's noted some blood pressures up into the 190s at home. He says however he feels bad when his blood pressure is more in the 120s to 130s.  I saw [name] back today in the office. Overall he reports doing fairly well. He decreased his exercise somewhat due to his wife being sick but is  interested in getting back into exercise routine. His last stress test was 7 years ago. His bypass was now 10 years ago. He denies any chest pain or worsening shortness of breath. Blood pressure is running somewhat high. His diuretics were stopped due to worsening renal function which seems to have normalized.  [name] returns today for follow-up. His main complaint has to do with pain in his back and decreased function in his legs. He says it's very difficult for him to get around. Although he has pain in his legs his symptoms are not necessarily consistent with claudication. He does have a history of decreased ABIs in the past and has not had lower extremity arterial Dopplers in some time. I was also recently reminded that he does have a history of renal artery aneurysm which was noted on the CT scan. This has not been reassessed in several years. He denies any significant symptoms with atrial fibrillation has had no bleeding problems on Savaysa.  01/04/16  [name] was seen today in follow-up. He denies any worsening chest pain or shortness of breath. He continues to have some tightness in his legs. He had normal ABIs by Dopplers last year however will need a repeat of that. His renal Dopplers also failed to indicate any stenosis. He is not in atrial fibrillation today and is concerned about the cost of Savaysa. He is interested in switching NOACs. He also brought lab work from the [place] today which showed adequate thyroid levels, elevated uric acid at 8.1  and LDL of 27 with total cholesterol of 115. He is not on statins.  12/23/2016  [name] returns today for follow-up.  Over the past year he has done well.  He denies any worsening shortness of breath or chest pain.  He had lower extremity Dopplers and carotid Dopplers this past year which indicate no significant insufficiency or stenosis.  He denies any recurrent atrial fibrillation.  He switched from [facility] over to Eliquis.  He has not had any  recent lab work.  He tells me his last lipid profile which I reviewed from the [place] showed his total cholesterol of 115.  He has not been on statin therapy due to this.  Fortunately, he has not had any recurrent coronary disease.  He has had no progressive peripheral arterial disease as well.  12/29/2017  [name] is seen today in annual follow-up.  Overall he is doing well denies any chest pain or worsening shortness of breath.  Carotid Doppler showed no significant carotid stenosis.  He does complain of some neck pain which is likely arthritis.  He has been tolerating Eliquis without bleeding problems.  He has had improvement in swelling on low-dose thiazide.  Cholesterol recently well controlled with total 189, triglycerides 65, HDL 56 and LDL 40.  01/03/2019  [name] is seen today for routine follow-up.  He continues to do well.  Weight is back up about 6 pounds.  He had at one point been below 190 pounds.  He denies chest pain or worsening shortness of breath.  His blood pressure this morning was 130/70 but is elevated some today in the office.  He does report some issues with ongoing urinary frequency particularly at night.  He asked about changing around his medications however this is primarily managed by the [place].  He denies any palpitations.  EKG today shows he is maintaining sinus rhythm with first-degree AV block.  He had no bleeding problems on Eliquis which she takes regularly.  Recent labs showed LDL of 51.  PMHx:  Past Medical History:  Diagnosis Date  . BPH (benign prostatic hyperplasia)   . CAD (coronary artery disease) of artery bypass graft 2006  . Fatty liver   . GERD (gastroesophageal reflux disease)   . Gilbert's syndrome   . Hiatal hernia   . History of nuclear stress test 11/2007   bruce myoview; normal pattern of persuion in all regions; post-stress EF 70%; low risk   . HTN (hypertension)   . Neuropathy   . PAF (paroxysmal atrial fibrillation) ([place])   . PVD  (peripheral vascular disease) ([place])    0-49% R & L ICA stenosis (2013)   . Renal artery aneurysm ([place])   . S/P CABG (coronary artery bypass graft) 2006  . Schatzki's ring    non critical  . Thrombocytopenia ([place])     Past Surgical History:  Procedure Laterality Date  . CARDIOVERSION N/A 01/14/2018   Procedure: CARDIOVERSION;  Surgeon: [name], MD;  Location: [facility] ENDOSCOPY;  Service: Cardiovascular;  Laterality: N/A;  . CATARACT EXTRACTION  2011  . COLONOSCOPY  2006   Dr. Margarito Courser pancolonic diverticula  . COLONOSCOPY  01/07/2012   Dr. Abbe Amsterdam normal rectum, pancolonic diverticulosis, hyperplastic polyp  . COLONOSCOPY N/A 03/20/2015   GEZ:[id number] diverticulosis  . CORONARY ARTERY BYPASS GRAFT  04/2004   LIMA to diagonal, SVG to ramus intermedius, SVG to PDA; placed stent for crossed coronary arteries (Dr. Prescott Gum)  . ESOPHAGOGASTRODUODENOSCOPY  2006   Dr. Ruthell Rummage  Schatzki's ring, s/p 58-F Maloney dilation  . ESOPHAGOGASTRODUODENOSCOPY  01/07/2012   Dr. Gareth Morgan ring, hiatal hernia, fundic gland polyp  . ESOPHAGOGASTRODUODENOSCOPY N/A 03/20/2015   NGE:XBMWU 1 varices/HH Gastric polpy s/p bx  . ESOPHAGOGASTRODUODENOSCOPY N/A 07/28/2018   Dr. Gala Romney: 3 short columns of grade 1 esophageal varices.  Esophageal dilation performed for dysphagia.  Multiple gastric polyps previously proved benign.  Marland Kitchen MALONEY DILATION N/A 07/28/2018   Procedure: Venia Minks DILATION;  Surgeon: Daneil Dolin, MD;  Location: [facility];  Service: Endoscopy;  Laterality: N/A;  . rotater cuff     repair  . TONSILLECTOMY      FAMHx:  Family History  Problem Relation Age of Onset  . Heart attack Mother 36       deceased  . Heart attack Father 80       deceased  . Heart disease Brother        x2  . Diabetes Brother        x3  . Colon cancer Neg Hx     SOCHx:   reports that he quit smoking about 14 years ago. His smoking use included cigarettes. He has a 40.00  pack-year smoking history. He has never used smokeless tobacco. He reports current alcohol use. He reports that he does not use drugs.  ALLERGIES:  Allergies  Allergen Reactions  . Antihistamines, Chlorpheniramine-Type Rash    Patient states he cant take large doses of antihistamines also causes prostate problems    ROS: Pertinent items noted in HPI and remainder of comprehensive ROS otherwise negative.  HOME MEDS: Current Outpatient Medications  Medication Sig Dispense Refill  . albuterol (PROVENTIL HFA;VENTOLIN HFA) 108 (90 Base) MCG/ACT inhaler Inhale 1-2 puffs into the lungs every 6 (six) hours as needed for wheezing or shortness of breath.     . allopurinol (ZYLOPRIM) 100 MG tablet Take 100 mg by mouth at bedtime.     . Alpha-D-Galactosidase (BEANO ULTRA 800) TABS Take 1 tablet by mouth daily as needed (gas).    Marland Kitchen amLODipine (NORVASC) 10 MG tablet Take 5 mg by mouth daily.    Marland Kitchen apixaban (ELIQUIS) 5 MG TABS tablet Take 1 tablet (5 mg total) by mouth 2 (two) times daily. 180 tablet 3  . fluticasone (FLONASE) 50 MCG/ACT nasal spray Place 1 spray into both nostrils 2 (two) times a day.    . gabapentin (NEURONTIN) 300 MG capsule Take 300 mg by mouth 3 (three) times daily.      . hydrochlorothiazide (HYDRODIURIL) 25 MG tablet Take 0.5 tablets (12.5 mg total) by mouth daily. *NEEDS OFFICE VISIT FOR FURTHER REFILLS* 15 tablet 0  . Levothyroxine Sodium 112 MCG CAPS Take 112 mcg by mouth daily before breakfast.     . losartan (COZAAR) 100 MG tablet Take 50 mg by mouth daily.    . metoprolol tartrate (LOPRESSOR) 25 MG tablet Take 1 tablet (25 mg total) by mouth 2 (two) times daily. *NEEDS OFFICE VISIT FOR FURTHER REFILLS* 60 tablet 0  . Probiotic Product (RESTORA) CAPS Take 1 capsule by mouth daily. 30 capsule 11  . Pumpkin Seed-Soy Germ (AZO BLADDER CONTROL/GO-LESS PO) Take by mouth.    . RABEprazole (ACIPHEX) 20 MG tablet Take 1 tablet (20 mg total) by mouth 2 (two) times daily. 180 tablet 3   . tamsulosin (FLOMAX) 0.4 MG CAPS capsule Take 0.4 mg by mouth at bedtime.     . triamcinolone cream (KENALOG) 0.1 % Apply 1 application topically 2 (two) times daily as needed (  FOR ITCHING/).    . finasteride (PROSCAR) 5 MG tablet Take 5 mg by mouth at bedtime.      No current facility-administered medications for this visit.     LABS/IMAGING: No results found for this or any previous visit (from the past 48 hour(s)). No results found.  VITALS: BP (!) 151/59   Pulse 61   Temp (!) 96.9 F (36.1 C)   Ht 5' 9"  (1.753 m)   Wt 197 lb 12.8 oz (89.7 kg)   SpO2 98%   BMI 29.21 kg/m   EXAM: General appearance: alert and no distress Neck: no carotid bruit and no JVD Lungs: clear to auscultation bilaterally Heart: regular rate and rhythm, S1, S2 normal, no murmur, click, rub or gallop Abdomen: soft, non-tender; bowel sounds normal; no masses,  no organomegaly Extremities: extremities normal, atraumatic, no cyanosis or edema Pulses: 2+ and symmetric Skin: Skin color, texture, turgor normal. No rashes or lesions Neurologic: Grossly normal Psych: Mood, affect normal  EKG: Sinus rhythm first-degree AV block at 68, nonspecific ST and T wave changes-personally reviewed  ASSESSMENT: 1. Coronary artery disease status post three-vessel CABG in 2006 (LIMA to diagonal, SVG to PDA and SVG to ramus intermedius) 2. Negative Myoview stress test in 2016  3. Hypertension 4. Mild carotid artery disease 5. Mild dyspnea with marked exertion 6. PAF-on Eliquis 7. Leg pain and weakness with walking 8. Renal Artery aneurysm  PLAN: 1.   [name] remains asymptomatic and denies any recurrent atrial fibrillation.  He denies any chest pain.  His blood pressure is elevated today but better at home.  Cholesterol is well controlled with LDL 51 but has gone up a little with his weight gain.  He continues to work on that.  He has some increased nocturia, and therefore advised him to increase his tamsulosin  to 0.8 mg at night.  If this is working well for him, he should reach out to the [place] for a new prescription to accommodate the dose increase.  Plan follow-up annually or sooner as necessary.  Pixie Casino, MD, [facility], [place] Director of the [facility]  [facility] Attending Cardiologist  Direct Dial: [phone]  Fax: [phone]  Website:  [email or web address] 01/03/2019, 11:25 AM

## 2019-01-03 NOTE — Patient Instructions (Signed)
Medication Instructions:  Your physician has recommended you make the following change in your medication:  -- INCREASE tamsulosin to 0.68m at night  -- if working [place] you, [facility] [place]  *If you need a refill on your cardiac medications before your next appointment, please call your pharmacy*  Lab Work: NONE If you have labs (blood work) drawn today and your tests are completely normal, you will receive your results only by: .Marland KitchenMyChart Message (if you have MyChart) OR . A paper copy in the mail If you have any lab test that is abnormal or we need to change your treatment, we will call you to review the results.  Testing/Procedures: NONE  Follow-Up: [facility] and your health needs are our priority.  As part of our continuing mission to provide you with exceptional heart care, we have created designated Provider Care Teams.  These Care Teams include your primary Cardiologist (physician) and Advanced Practice Providers (APPs -  Physician Assistants and Nurse Practitioners) who all work together to provide you with the care you need, when you need it.  Your next appointment:   6 month(s)  The format for your next appointment:   Either In Person or Virtual  Provider:   You may see KPixie Casino MD or one of the following Advanced Practice Providers on your designated Care Team:    HAlmyra Deforest PA-C  AFabian Sharp PA-C or   KRoby Lofts [place]  Other Instructions

## 2019-01-04 ENCOUNTER — Other Ambulatory Visit: Payer: Self-pay | Admitting: Adult Health

## 2019-02-03 DIAGNOSIS — G894 Chronic pain syndrome: Secondary | ICD-10-CM | POA: Diagnosis not present

## 2019-02-03 DIAGNOSIS — I2510 Atherosclerotic heart disease of native coronary artery without angina pectoris: Secondary | ICD-10-CM | POA: Diagnosis not present

## 2019-02-03 DIAGNOSIS — E063 Autoimmune thyroiditis: Secondary | ICD-10-CM | POA: Diagnosis not present

## 2019-05-11 ENCOUNTER — Encounter: Payer: Self-pay | Admitting: Gastroenterology

## 2019-05-11 ENCOUNTER — Other Ambulatory Visit: Payer: Self-pay

## 2019-05-11 ENCOUNTER — Ambulatory Visit: Payer: Medicare Other | Admitting: Gastroenterology

## 2019-05-11 VITALS — BP 147/62 | HR 56 | Temp 97.1°F | Ht 69.0 in | Wt 193.2 lb

## 2019-05-11 DIAGNOSIS — R143 Flatulence: Secondary | ICD-10-CM

## 2019-05-11 DIAGNOSIS — K7460 Unspecified cirrhosis of liver: Secondary | ICD-10-CM | POA: Diagnosis not present

## 2019-05-11 NOTE — Assessment & Plan Note (Signed)
Stable.  Remains well compensated.  He has a history of grade 1 esophageal varices as outlined.  Declines nonselective beta-blocker as he does not want to come off of metoprolol.  Currently he is due for labs hepatoma surveillance.  He would like to postpone his ultrasound for 6 months stating he has a lot going on right now.  Will obtain recent labs done with the [place].  We will have him come back in 6 months to see Dr. Gala Romney.  At that time we can make arrangements for right upper quadrant ultrasound.  He states he would like to have this done at the [place] because it would be less expensive for him.

## 2019-05-11 NOTE — Assessment & Plan Note (Signed)
Doing better on probiotics, Restora was more beneficial than Align. Currently on back order. We will inquire about availability. Return to the office in 6 months.

## 2019-05-11 NOTE — Progress Notes (Signed)
Primary Care Physician: Redmond School, MD  Primary Gastroenterologist:  Garfield Cornea, MD   Chief Complaint  Patient presents with  . Cirrhosis    doing ok    HPI: [name] is a 86 y.o. male here for follow-up.  History of [place] cirrhosis.  Last seen October 2020.  EGD 07/2018: 3 short columns of grade 1 esophageal varices. Esophageal dilation performed for dysphagia. Multiple gastric polyps previously proved benign. Did not want to change metoprolol to a nonselective beta blocker because metoprolol is "life-saving".   Right upper quadrant ultrasound October 2020: Echogenic parenchyma, likely fatty infiltration though can be seen with cirrhosis.  History of varices as outlined.  Last labs in October 2020.  Creatinine 1.12, albumin 4.3, total bilirubin 0.9, alkaline phosphatase 25, AST 40, ALT 21, albumin 4.3.  Hemoglobin 11.5, hematocrit 32.7.  Instructed had I FOBT and repeat CBC along with iron studies.  Clinically doing well.  Continues to have a lot of flatulence.  States restora worked very well for him but the other probiotics do not seem to help.  Has not been able to find restora, told by multiple pharmacies that is is on back order.  Dominance are regular.  No abdominal pain.  No upper GI symptoms.  No significant edema issues.  States he has had follow-up blood work through the [place] recently.  We try to get him to have anemia labs and I FOBT done, [facility]  Current Outpatient Medications  Medication Sig Dispense Refill  . albuterol (PROVENTIL HFA;VENTOLIN HFA) 108 (90 Base) MCG/ACT inhaler Inhale 1-2 puffs into the lungs every 6 (six) hours as needed for wheezing or shortness of breath.     . allopurinol (ZYLOPRIM) 100 MG tablet Take 100 mg by mouth at bedtime.     . Alpha-D-Galactosidase (BEANO ULTRA 800) TABS Take 1 tablet by mouth daily as needed (gas).    Marland Kitchen amLODipine (NORVASC) 10 MG tablet Take 5 mg by mouth daily.    Marland Kitchen  apixaban (ELIQUIS) 5 MG TABS tablet Take 1 tablet (5 mg total) by mouth 2 (two) times daily. 180 tablet 3  . finasteride (PROSCAR) 5 MG tablet Take 5 mg by mouth at bedtime.     . fluticasone (FLONASE) 50 MCG/ACT nasal spray Place 1 spray into both nostrils 2 (two) times a day.    . gabapentin (NEURONTIN) 300 MG capsule Take 300 mg by mouth 3 (three) times daily.      . hydrochlorothiazide (HYDRODIURIL) 25 MG tablet Take 0.5 tablets (12.5 mg total) by mouth daily. 15 tablet 5  . Levothyroxine Sodium 112 MCG CAPS Take 112 mcg by mouth daily before breakfast.     . losartan (COZAAR) 100 MG tablet Take 50 mg by mouth daily.    . metoprolol tartrate (LOPRESSOR) 25 MG tablet Take 1 tablet (25 mg total) by mouth 2 (two) times daily. *NEEDS OFFICE VISIT FOR FURTHER REFILLS* 60 tablet 0  . RABEprazole (ACIPHEX) 20 MG tablet Take 1 tablet (20 mg total) by mouth 2 (two) times daily. 180 tablet 3  . tamsulosin (FLOMAX) 0.4 MG CAPS capsule Take 0.4 mg by mouth at bedtime.     . triamcinolone cream (KENALOG) 0.1 % Apply 1 application topically 2 (two) times daily as needed (FOR ITCHING/).     No current facility-administered medications for this visit.    Allergies as of 05/11/2019 - Review Complete 05/11/2019  Allergen Reaction Noted  .  Antihistamines, chlorpheniramine-type Rash 08/26/2011    ROS:  General: Negative for anorexia, weight loss, fever, chills, fatigue, weakness. ENT: Negative for hoarseness, difficulty swallowing , nasal congestion. CV: Negative for chest pain, angina, palpitations, dyspnea on exertion, peripheral edema.  Respiratory: Negative for dyspnea at rest, dyspnea on exertion, cough, sputum, wheezing.  GI: See history of present illness. GU:  Negative for dysuria, hematuria, urinary incontinence, urinary frequency, nocturnal urination.  Endo: Negative for unusual weight change.    Physical Examination:   BP (!) 147/62   Pulse (!) 56   Temp (!) 97.1 F (36.2 C) (Temporal)    Ht 5' 9"  (1.753 m)   Wt 193 lb 3.2 oz (87.6 kg)   BMI 28.53 kg/m   General: Well-nourished, well-developed in no acute distress.  Eyes: No icterus. Mouth: masked Lungs: Clear to auscultation bilaterally.  Heart: Regular rate and rhythm, no murmurs rubs or gallops.  Abdomen: Bowel sounds are normal, nontender, nondistended, no hepatosplenomegaly or masses, no abdominal bruits or hernia , no rebound or guarding.   Extremities: No lower extremity edema. No clubbing or deformities. Neuro: Alert and oriented x 4   Skin: Warm and dry, no jaundice.   Psych: Alert and cooperative, normal mood and affect.  Labs:  See hpi Imaging Studies: No results found.

## 2019-05-11 NOTE — Patient Instructions (Signed)
1. I will get copy of most recent labs from the [place] for review. 2. I will look into Restora availability for you and let you know what is found out.  3. Return in six months to see Dr. Gala Romney. We will make arrangements for your ultrasound at that time.

## 2019-05-14 ENCOUNTER — Other Ambulatory Visit: Payer: Self-pay | Admitting: Adult Health

## 2019-05-16 NOTE — Progress Notes (Signed)
CC'ED TO PCP 

## 2019-06-13 ENCOUNTER — Telehealth (INDEPENDENT_AMBULATORY_CARE_PROVIDER_SITE_OTHER): Payer: Medicare Other | Admitting: Physician Assistant

## 2019-06-13 VITALS — BP 144/66 | HR 66 | Ht 69.0 in

## 2019-06-13 DIAGNOSIS — I10 Essential (primary) hypertension: Secondary | ICD-10-CM

## 2019-06-13 DIAGNOSIS — I25810 Atherosclerosis of coronary artery bypass graft(s) without angina pectoris: Secondary | ICD-10-CM | POA: Diagnosis not present

## 2019-06-13 DIAGNOSIS — K7581 Nonalcoholic steatohepatitis (NASH): Secondary | ICD-10-CM | POA: Diagnosis not present

## 2019-06-13 DIAGNOSIS — I480 Paroxysmal atrial fibrillation: Secondary | ICD-10-CM

## 2019-06-13 DIAGNOSIS — E785 Hyperlipidemia, unspecified: Secondary | ICD-10-CM | POA: Diagnosis not present

## 2019-06-13 DIAGNOSIS — K7460 Unspecified cirrhosis of liver: Secondary | ICD-10-CM

## 2019-06-13 NOTE — Progress Notes (Signed)
Virtual Visit via Telephone Note   This visit type was conducted due to national recommendations for restrictions regarding the COVID-19 Pandemic (e.g. social distancing) in an effort to limit this patient's exposure and mitigate transmission in our community.  Due to his co-morbid illnesses, this patient is at least at moderate risk for complications without adequate follow up.  This format is felt to be most appropriate for this patient at this time.  The patient did not have access to video technology/had technical difficulties with video requiring transitioning to audio format only (telephone).  All issues noted in this document were discussed and addressed.  No physical exam could be performed with this format.  Please refer to the patient's chart for his  consent to telehealth for [facility].   The patient was identified using 2 identifiers.  Date:  06/13/2019   ID:  [name], DOB 1934/01/29, MRN [id number]  Patient Location: Home Provider Location: Home  PCP:  Redmond School, MD  Cardiologist:  Pixie Casino, MD  Electrophysiologist:  None   Evaluation Performed:  Follow-Up Visit  Chief Complaint:  followup  History of Present Illness:    [name] is a 86 y.o. male with PMH of CAD s/p CABG 2006, fatty liver, hypertension, hyperlipidemia, PAD, NASH cirrhosis and PAF.  He has a history of mild carotid artery disease.  Last Myoview was obtained in April 2016 which showed EF 58%, normal stress nuclear study.  Last echocardiogram obtained on 01/13/2018 showed EF 55 to 60%, moderate LVH, PA peak pressure 36 mmHg.  He was on Coumadin in the past for A. fib, this was later switched to Xarelto.  This was later switched to [facility] and eventually Eliquis 5 mg twice daily.  His last cardioversion was in December 2019.  He was last seen by Dr. Debara Pickett in November 2020 at which time he was doing well.  EKG at the time shows he was maintaining sinus rhythm with first-degree AV  block.  Patient was contacted today via telephone.  He denies any chest pain or shortness of breath.  He has no bleeding issues.  Most of his medication has been obtained from [place].  He was recently seen by GI service for his NASH cirrhosis with stage I esophageal varices.  They recommended nonselective beta-blocker however patient was hesitant to switch from metoprolol due to concern of poor rate control.  I will discuss the case with Dr. Debara Pickett to see if he would recommend switch to propranolol which is better studied in patients with NASH cirrhosis with esophageal varices.  The patient does not have symptoms concerning for COVID-19 infection (fever, chills, cough, or new shortness of breath).    Past Medical History:  Diagnosis Date  . BPH (benign prostatic hyperplasia)   . CAD (coronary artery disease) of artery bypass graft 2006  . Fatty liver   . GERD (gastroesophageal reflux disease)   . Gilbert's syndrome   . Hiatal hernia   . History of nuclear stress test 11/2007   bruce myoview; normal pattern of persuion in all regions; post-stress EF 70%; low risk   . HTN (hypertension)   . Neuropathy   . PAF (paroxysmal atrial fibrillation) ([place])   . PVD (peripheral vascular disease) ([place])    0-49% R & L ICA stenosis (2013)   . Renal artery aneurysm ([place])   . S/P CABG (coronary artery bypass graft) 2006  . Schatzki's ring    non critical  . Thrombocytopenia ([place])  Past Surgical History:  Procedure Laterality Date  . CARDIOVERSION N/A 01/14/2018   Procedure: CARDIOVERSION;  Surgeon: [name], MD;  Location: [facility] ENDOSCOPY;  Service: Cardiovascular;  Laterality: N/A;  . CATARACT EXTRACTION  2011  . COLONOSCOPY  2006   Dr. Margarito Courser pancolonic diverticula  . COLONOSCOPY  01/07/2012   Dr. Abbe Amsterdam normal rectum, pancolonic diverticulosis, hyperplastic polyp  . COLONOSCOPY N/A 03/20/2015   [id number] diverticulosis  . CORONARY ARTERY BYPASS GRAFT  04/2004    LIMA to diagonal, SVG to ramus intermedius, SVG to PDA; placed stent for crossed coronary arteries (Dr. Prescott Gum)  . ESOPHAGOGASTRODUODENOSCOPY  2006   Dr. Ruthell Rummage Schatzki's ring, s/p 58-F Maloney dilation  . ESOPHAGOGASTRODUODENOSCOPY  01/07/2012   Dr. Gareth Morgan ring, hiatal hernia, fundic gland polyp  . ESOPHAGOGASTRODUODENOSCOPY N/A 03/20/2015   KNL:ZJQBH 1 varices/HH Gastric polpy s/p bx  . ESOPHAGOGASTRODUODENOSCOPY N/A 07/28/2018   Dr. Gala Romney: 3 short columns of grade 1 esophageal varices.  Esophageal dilation performed for dysphagia.  Multiple gastric polyps previously proved benign.  Marland Kitchen MALONEY DILATION N/A 07/28/2018   Procedure: Venia Minks DILATION;  Surgeon: Daneil Dolin, MD;  Location: [facility] ENDO [facility];  Service: Endoscopy;  Laterality: N/A;  . rotater cuff     repair  . TONSILLECTOMY       Current Meds  Medication Sig  . albuterol (PROVENTIL HFA;VENTOLIN HFA) 108 (90 Base) MCG/ACT inhaler Inhale 1-2 puffs into the lungs every 6 (six) hours as needed for wheezing or shortness of breath.   . allopurinol (ZYLOPRIM) 100 MG tablet Take 100 mg by mouth at bedtime.   . Alpha-D-Galactosidase (BEANO ULTRA 800) TABS Take 1 tablet by mouth daily as needed (gas).  Marland Kitchen amLODipine (NORVASC) 5 MG tablet Take 5 mg by mouth daily.   Marland Kitchen apixaban (ELIQUIS) 5 MG TABS tablet Take 1 tablet (5 mg total) by mouth 2 (two) times daily.  . finasteride (PROSCAR) 5 MG tablet Take 5 mg by mouth at bedtime.   . fluticasone (FLONASE) 50 MCG/ACT nasal spray Place 1 spray into both nostrils 2 (two) times a day.  . gabapentin (NEURONTIN) 300 MG capsule Take 300 mg by mouth 3 (three) times daily.    . hydrochlorothiazide (HYDRODIURIL) 25 MG tablet Take 1/2 (one-half) tablet by mouth once daily  . Levothyroxine Sodium 112 MCG CAPS Take 112 mcg by mouth daily before breakfast.   . losartan (COZAAR) 100 MG tablet Take 50 mg by mouth daily.  . metoprolol tartrate (LOPRESSOR) 25 MG tablet Take 1 tablet (25  mg total) by mouth 2 (two) times daily. *NEEDS OFFICE VISIT FOR FURTHER REFILLS* (Patient taking differently: Take 12.5 mg by mouth 2 (two) times daily. *NEEDS OFFICE VISIT FOR FURTHER REFILLS*)  . RABEprazole (ACIPHEX) 20 MG tablet Take 1 tablet (20 mg total) by mouth 2 (two) times daily.  . tamsulosin (FLOMAX) 0.4 MG CAPS capsule Take 0.4 mg by mouth at bedtime.   . triamcinolone cream (KENALOG) 0.1 % Apply 1 application topically 2 (two) times daily as needed (FOR ITCHING/).     Allergies:   Antihistamines, chlorpheniramine-type   Social History   Tobacco Use  . Smoking status: Former Smoker    Packs/day: 1.00    Years: 40.00    Pack years: 40.00    Types: Cigarettes    Quit date: 04/07/2004    Years since quitting: 15.1  . Smokeless tobacco: Never Used  Substance Use Topics  . Alcohol use: Yes    Comment: occassional drinker  . Drug use:  No     Family Hx: The patient's family history includes Diabetes in his brother; Heart attack (age of onset: 61) in his mother; Heart attack (age of onset: 36) in his father; Heart disease in his brother. There is no history of Colon cancer.  ROS:   Please see the history of present illness.     All other systems reviewed and are negative.   Prior CV studies:   The following studies were reviewed today:  Echo 01/13/2018 LV EF: 55% -  60%   -------------------------------------------------------------------  Indications:   I48.91 Atrial fibrillation. I48.92 Atrial  flutter. I51.9 Decreased LV function.   -------------------------------------------------------------------  History:  PMH:  Coronary artery disease. Risk factors:  Hypertension.   -------------------------------------------------------------------  Study Conclusions   - Left ventricle: The cavity size was normal. Wall thickness was  increased in a pattern of moderate LVH. Systolic function was  normal. The estimated ejection fraction was in the range of 55%   to 60%. Although no diagnostic regional wall motion abnormality  was identified, this possibility cannot be completely excluded on  the basis of this study. The study was not technically sufficient  to allow evaluation of LV diastolic dysfunction due to atrial  fibrillation.  - Aortic valve: Trileaflet; mildly thickened, mildly calcified  leaflets. Right coronary cusp mobility was mildly restricted.  Sclerosis without stenosis.  - Mitral valve: Calcified annulus. Mildly thickened leaflets .  There was trivial regurgitation.  - Left atrium: The atrium was mildly dilated.  - Right ventricle: Systolic function was normal.  - Atrial septum: No defect or patent foramen ovale was identified.  - Tricuspid valve: There was mild regurgitation.  - Pulmonary arteries: PA peak pressure: 36 mm Hg (S).   Impressions:   - Afib with variable HR throughout study. Grossly normal LVEF  without regional wall motion abnormalities. Calcific valve  disease without stenosis.   Labs/Other Tests and Data Reviewed:    EKG:  An ECG dated 01/03/2019 was personally reviewed today and demonstrated:  Sinus rhythm with first-degree AV block.  Recent Labs: No results found for requested labs within last 8760 hours.   Recent Lipid Panel Lab Results  Component Value Date/Time   CHOL 117 05/10/2014 09:55 AM   TRIG 60 05/10/2014 09:55 AM   HDL 55 05/10/2014 09:55 AM   CHOLHDL 2.4 11/07/2007 05:40 AM   LDLCALC 50 05/10/2014 09:55 AM    Wt Readings from Last 3 Encounters:  05/11/19 193 lb 3.2 oz (87.6 kg)  01/03/19 197 lb 12.8 oz (89.7 kg)  11/10/18 191 lb (86.6 kg)     Objective:    Vital Signs:  BP (!) 144/66   Pulse 66   Ht 5' 9"  (1.753 m)   BMI 28.53 kg/m    VITAL SIGNS:  reviewed  ASSESSMENT & PLAN:    1. CAD s/p CABG: Denies any recent chest pain.  Not on aspirin given the need for Eliquis.  2. Hypertension: Blood pressure mildly elevated this morning, however normally  it is well controlled.  3. Hyperlipidemia: Will defer annual lipid panel to primary care provider.  4. PAF: Maintaining sinus rhythm on the last EKG.  No recent palpitation.  Continue Eliquis and metoprolol  5. NASH cirrhosis with portal hypertension: We will discuss with Dr. Debara Pickett to see if he would recommend switching from metoprolol to propranolol.  COVID-19 Education: The signs and symptoms of COVID-19 were discussed with the patient and how to seek care for testing (follow up with PCP  or arrange E-visit).  The importance of social distancing was discussed today.  Time:   Today, I have spent 12 minutes with the patient with telehealth technology discussing the above problems.     Medication Adjustments/Labs and Tests Ordered: Current medicines are reviewed at length with the patient today.  Concerns regarding medicines are outlined above.   Tests Ordered: No orders of the defined types were placed in this encounter.   Medication Changes: No orders of the defined types were placed in this encounter.   Follow Up:  Either In Person or Virtual in 6 month(s)  Signed, Almyra Deforest, [place]  06/13/2019 8:32 AM    [facility]

## 2019-06-13 NOTE — Patient Instructions (Signed)
Your physician recommends that you continue on your current medications as directed. Please refer to the Current Medication list given to you today.   Your physician wants you to follow-up in: [place] will receive a reminder letter in the mail two months in advance. If you don't receive a letter, please call our office to schedule the follow-up appointment.

## 2019-06-20 ENCOUNTER — Telehealth: Payer: Self-pay | Admitting: Gastroenterology

## 2019-06-20 NOTE — Telephone Encounter (Signed)
I called [facility] and requested any recent or last labs, anything regarding anemia. She is going to have [facility] fax records to [place].

## 2019-06-20 NOTE — Telephone Encounter (Signed)
[name], we requested labs (specifically anemia labs) from the [place] after his last OV and I haven't received any. Can we request again?

## 2019-06-29 IMAGING — US US ABDOMEN LIMITED
1 series · 14 of 25 positions shown · non-contrast
Comparison: 04/02/2017 right upper quadrant ultrasound. 09/26/2016
MR.

CLINICAL DATA: 84-year-old male. Liver disease. Subsequent
encounter.

EXAM:
ULTRASOUND ABDOMEN LIMITED RIGHT UPPER QUADRANT

[Series 1: us abdomen limited · 0.19mm/px · 14 of 35 slices shown]
[im 1/35]
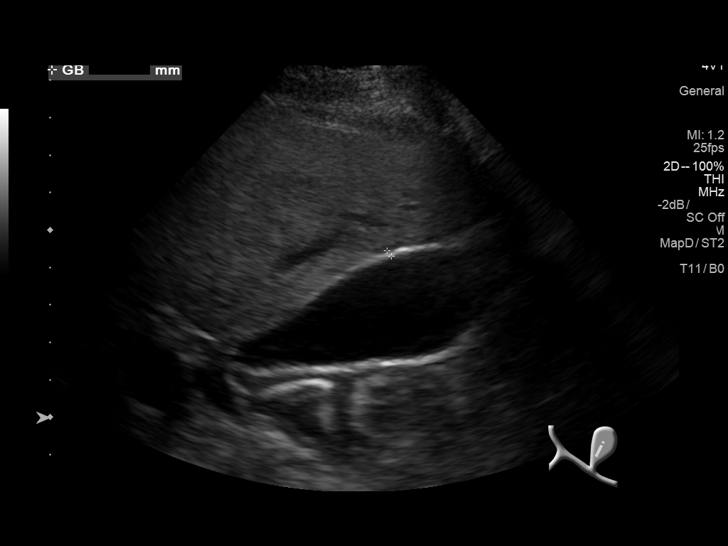
[im 3/35]
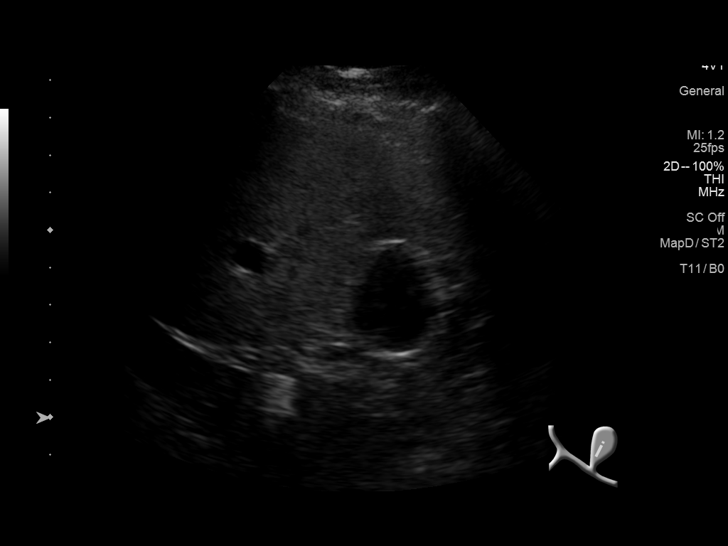
[im 6/35]
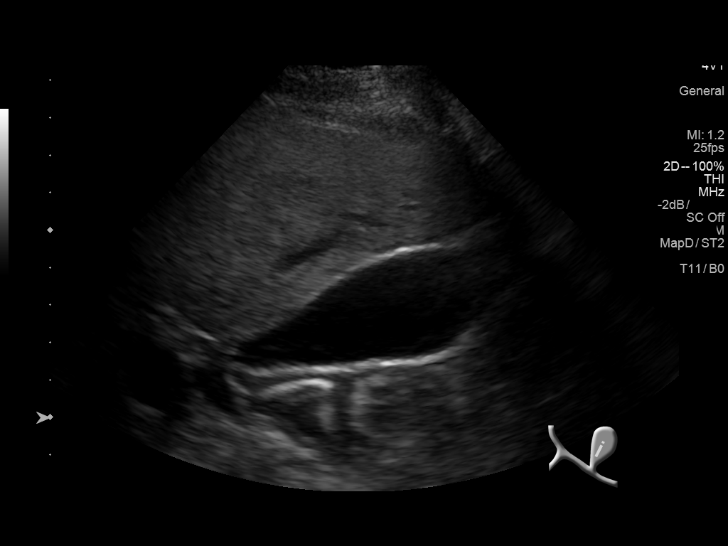
[im 9/35]
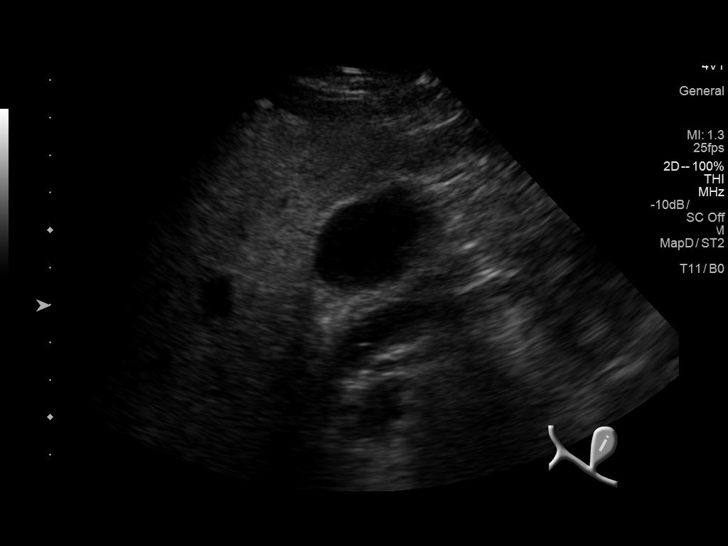
[im 12/35]
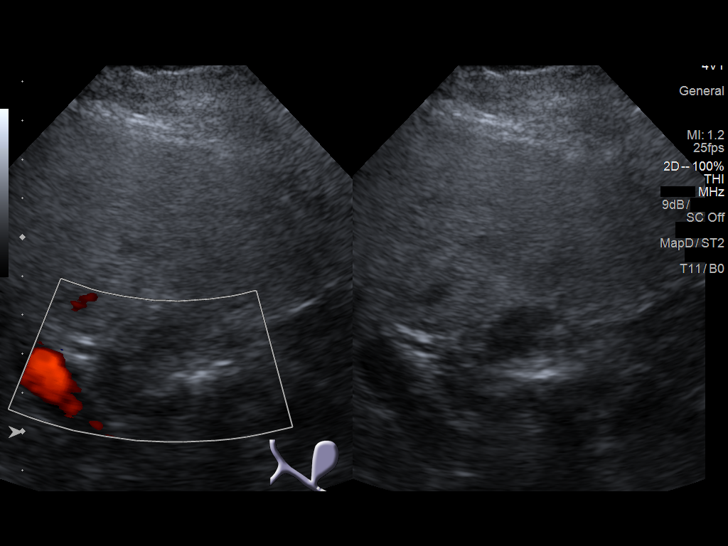
[im 13/35]
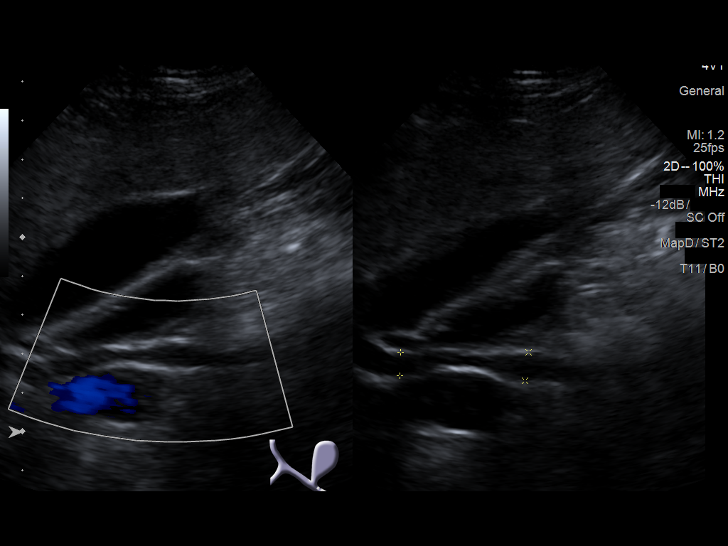
[im 16/35]
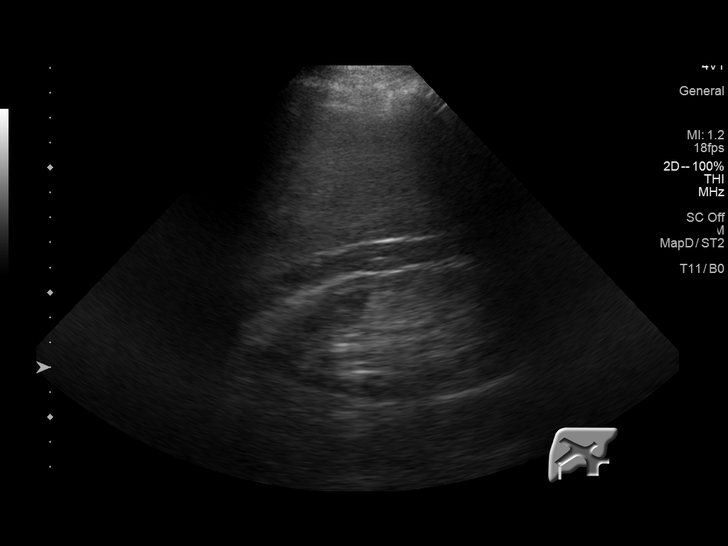
[im 19/35]
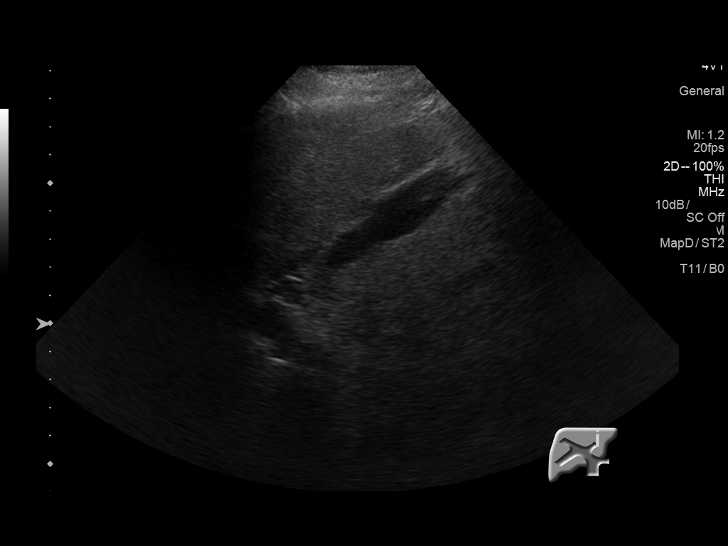
[im 22/35]
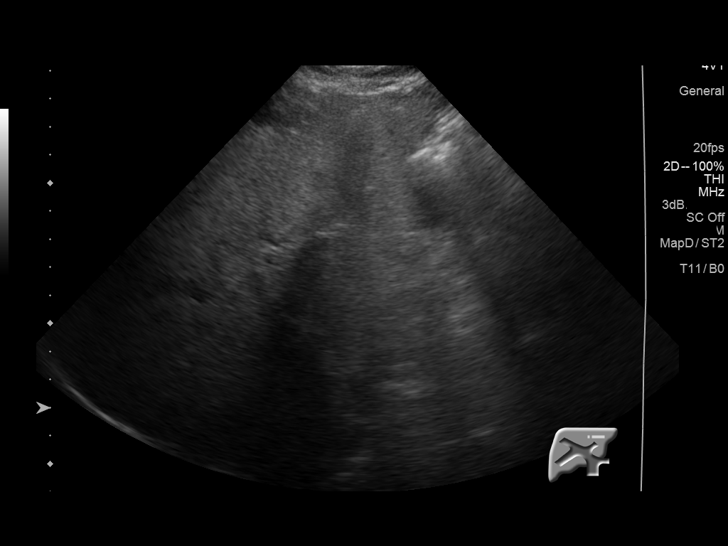
[im 23/35]
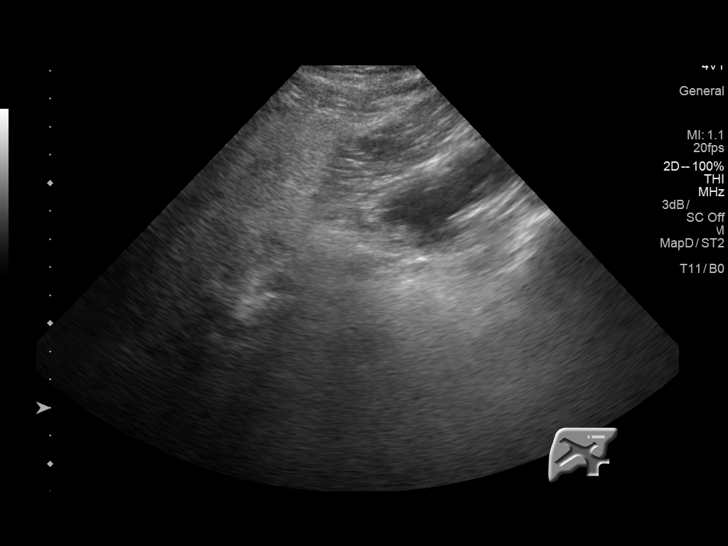
[im 26/35]
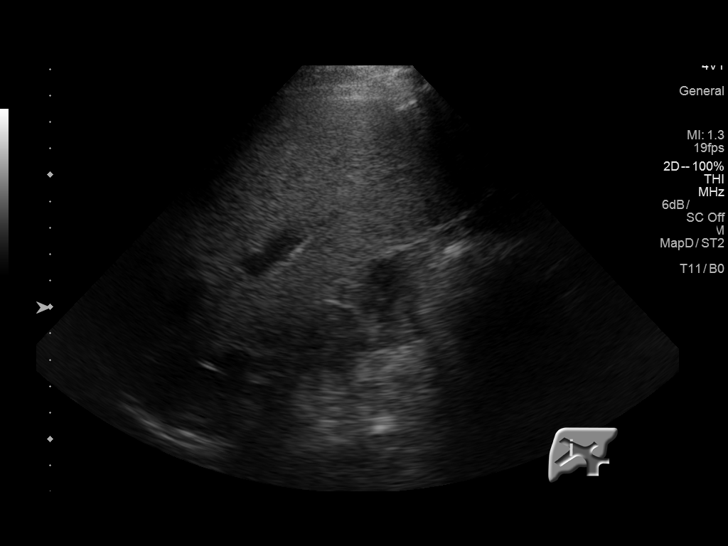
[im 29/35]
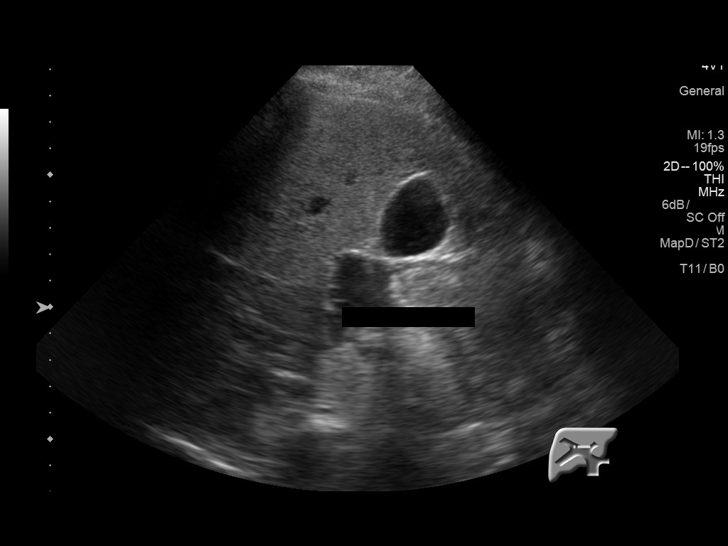
[im 32/35]
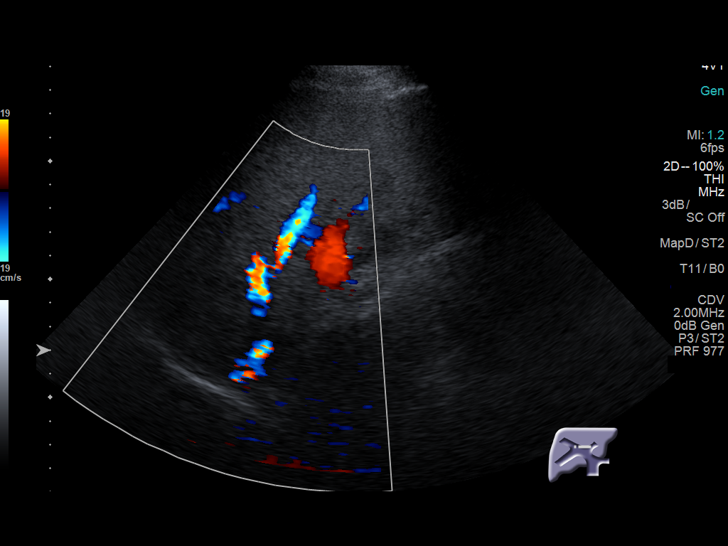
[im 35/35]
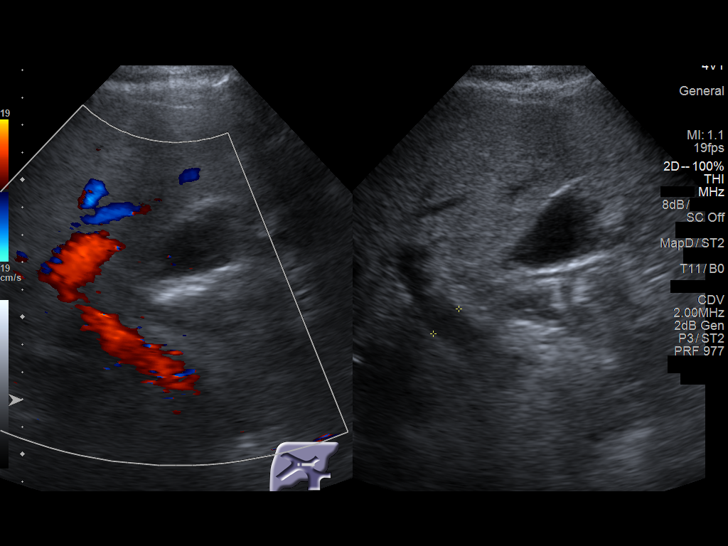

[14 of 25 positions shown; findings below may reference images not displayed]

FINDINGS: Gallbladder:

No gallstones or wall thickening visualized. No sonographic Murphy
sign noted by sonographer.

Common bile duct:

Diameter: Measures up to 7.4 mm without common bile duct stone
noted.

Liver:

Liver of increased echogenicity consistent with fatty infiltration
and/or hepatocellular disease. No focal hepatic lesion noted. Portal
vein is patent on color Doppler imaging with normal direction of
blood flow towards the liver.
IMPRESSION: 1. Liver of increased echogenicity consistent with fatty
infiltration and/or hepatocellular disease. No focal hepatic lesion
noted.
2. Prominent common bile duct similar to prior MRCP without common
bile duct stone noted.
3. Gallbladder unremarkable.

## 2019-07-02 ENCOUNTER — Other Ambulatory Visit: Payer: Self-pay | Admitting: Adult Health

## 2019-07-07 DIAGNOSIS — K580 Irritable bowel syndrome with diarrhea: Secondary | ICD-10-CM | POA: Diagnosis not present

## 2019-07-07 DIAGNOSIS — J040 Acute laryngitis: Secondary | ICD-10-CM | POA: Diagnosis not present

## 2019-07-11 DIAGNOSIS — N138 Other obstructive and reflux uropathy: Secondary | ICD-10-CM | POA: Diagnosis not present

## 2019-08-09 NOTE — Telephone Encounter (Signed)
Lmom, waiting on a return call.  

## 2019-08-09 NOTE — Telephone Encounter (Signed)
ON RECALL FOR RMR APPOINTMENT

## 2019-08-09 NOTE — Telephone Encounter (Signed)
Please let pt know that we were never able to get copy of his labs from the [place]. Multiple attempts not successful.   He can either have the [place] forward labs to [place] or we can have his labs updated locally.   He needs ov with RMR only 10/2019.

## 2019-08-10 NOTE — Telephone Encounter (Signed)
Pt called back. Pt has a copy of his labs and states he is going to fax them to our office for LSL to review. When labs are received, they will be given to LSL to review.

## 2019-08-18 DIAGNOSIS — K219 Gastro-esophageal reflux disease without esophagitis: Secondary | ICD-10-CM | POA: Diagnosis not present

## 2019-08-18 DIAGNOSIS — R490 Dysphonia: Secondary | ICD-10-CM | POA: Diagnosis not present

## 2019-08-18 DIAGNOSIS — T161XXA Foreign body in right ear, initial encounter: Secondary | ICD-10-CM | POA: Diagnosis not present

## 2019-09-02 DIAGNOSIS — I480 Paroxysmal atrial fibrillation: Secondary | ICD-10-CM | POA: Diagnosis not present

## 2019-09-02 DIAGNOSIS — I129 Hypertensive chronic kidney disease with stage 1 through stage 4 chronic kidney disease, or unspecified chronic kidney disease: Secondary | ICD-10-CM | POA: Diagnosis not present

## 2019-09-02 DIAGNOSIS — E063 Autoimmune thyroiditis: Secondary | ICD-10-CM | POA: Diagnosis not present

## 2019-09-02 DIAGNOSIS — N182 Chronic kidney disease, stage 2 (mild): Secondary | ICD-10-CM | POA: Diagnosis not present

## 2019-09-08 DIAGNOSIS — K219 Gastro-esophageal reflux disease without esophagitis: Secondary | ICD-10-CM | POA: Diagnosis not present

## 2019-09-08 DIAGNOSIS — R490 Dysphonia: Secondary | ICD-10-CM | POA: Diagnosis not present

## 2019-09-28 ENCOUNTER — Telehealth: Payer: Self-pay | Admitting: *Deleted

## 2019-09-28 NOTE — Telephone Encounter (Signed)
Tried to call pt. LMOAM both cell and home numbers in chart.   [name], please reach out to patient tomorrow if he does not return call today. Thanks.

## 2019-09-28 NOTE — Telephone Encounter (Signed)
Patient called in stating he had a nodule removed from throat 09/19/19. He received news he has throat cancer. He saw Dr. Brien Mates E[facility] [place]. He states it is early stage and could have radiation done. He wants to speak with Magda Paganini or Dr. Gala Romney personally. He can be reached at home or mobile # in chart. Please advise thanks

## 2019-09-28 NOTE — Telephone Encounter (Signed)
Labs received from March 2021:  Glucose 97, sodium 140, BUN 15, creatinine 1.13, hemoglobin 11.9 low, hematocrit 34.8 low, MCV 99.1 high, platelets 176,000, albumin 4.1, total bilirubin 1.1, alk phos 117, AST 32, ALT 31  H/H stable.   Due ov with RMR 10/2019 (anemia, cirrhosis). Please schedule.

## 2019-09-28 NOTE — Telephone Encounter (Signed)
Spoke to patient. He wanted our opinion regarding if he should proceed with PET scan as recommended by his ENT.  Per patient, they think his cancer is small and local and may be curable with radiation.  Advised patient to pursue PET scan as recommended by his ENT.  He also would like Dr. Gala Romney to weigh in.  He is aware that Dr. Gala Romney is currently out of the office but I will give him the message.  Dr.Rourk, patient wants to know if there is any reason he shouldn't get PET as recommended by his ENT.

## 2019-09-29 ENCOUNTER — Encounter: Payer: Self-pay | Admitting: Internal Medicine

## 2019-09-29 NOTE — Telephone Encounter (Signed)
PATIENT SCHEDULED AND LETTER SENT  °

## 2019-10-03 NOTE — Telephone Encounter (Signed)
[name] can you please let pt know Dr. Roseanne Kaufman responds.

## 2019-10-03 NOTE — Telephone Encounter (Signed)
I can think of NO reason not to do a PET scan.  It would be wise to follow the ENT doctors recommendations.

## 2019-10-03 NOTE — Telephone Encounter (Signed)
Called and informed pt of Dr. Roseanne Kaufman recommendation. Reminded him of OV 10/21/19 with Dr. Gala Romney.

## 2019-10-08 DIAGNOSIS — C329 Malignant neoplasm of larynx, unspecified: Secondary | ICD-10-CM | POA: Diagnosis not present

## 2019-10-21 ENCOUNTER — Ambulatory Visit: Payer: Medicare Other | Admitting: Internal Medicine

## 2019-11-03 DIAGNOSIS — G894 Chronic pain syndrome: Secondary | ICD-10-CM | POA: Diagnosis not present

## 2019-11-03 DIAGNOSIS — I10 Essential (primary) hypertension: Secondary | ICD-10-CM | POA: Diagnosis not present

## 2019-11-03 DIAGNOSIS — E063 Autoimmune thyroiditis: Secondary | ICD-10-CM | POA: Diagnosis not present

## 2019-12-03 DIAGNOSIS — E063 Autoimmune thyroiditis: Secondary | ICD-10-CM | POA: Diagnosis not present

## 2019-12-03 DIAGNOSIS — G894 Chronic pain syndrome: Secondary | ICD-10-CM | POA: Diagnosis not present

## 2019-12-03 DIAGNOSIS — I10 Essential (primary) hypertension: Secondary | ICD-10-CM | POA: Diagnosis not present

## 2019-12-08 DIAGNOSIS — E7849 Other hyperlipidemia: Secondary | ICD-10-CM | POA: Diagnosis not present

## 2019-12-08 DIAGNOSIS — Z0001 Encounter for general adult medical examination with abnormal findings: Secondary | ICD-10-CM | POA: Diagnosis not present

## 2019-12-08 DIAGNOSIS — I2510 Atherosclerotic heart disease of native coronary artery without angina pectoris: Secondary | ICD-10-CM | POA: Diagnosis not present

## 2019-12-27 ENCOUNTER — Ambulatory Visit: Payer: Medicare Other | Admitting: Internal Medicine

## 2019-12-27 ENCOUNTER — Encounter: Payer: Self-pay | Admitting: Internal Medicine

## 2019-12-27 ENCOUNTER — Other Ambulatory Visit: Payer: Self-pay

## 2019-12-27 VITALS — BP 150/78 | HR 57 | Temp 97.7°F | Ht 72.0 in | Wt 203.2 lb

## 2019-12-27 DIAGNOSIS — I10 Essential (primary) hypertension: Secondary | ICD-10-CM

## 2019-12-27 DIAGNOSIS — Z951 Presence of aortocoronary bypass graft: Secondary | ICD-10-CM

## 2019-12-27 DIAGNOSIS — I480 Paroxysmal atrial fibrillation: Secondary | ICD-10-CM | POA: Diagnosis not present

## 2019-12-27 DIAGNOSIS — R011 Cardiac murmur, unspecified: Secondary | ICD-10-CM | POA: Diagnosis not present

## 2019-12-27 DIAGNOSIS — E785 Hyperlipidemia, unspecified: Secondary | ICD-10-CM

## 2019-12-27 NOTE — Patient Instructions (Signed)
Medication Instructions:  Continue current medications  *If you need a refill on your cardiac medications before your next appointment, please call your pharmacy*   Lab Work: None Ordered   Testing/Procedures: Your physician has requested that you have an echocardiogram at [facility]. Echocardiography is a painless test that uses sound waves to create images of your heart. It provides your doctor with information about the size and shape of your heart and how well your heart's chambers and valves are working. This procedure takes approximately one hour. There are no restrictions for this procedure.   Follow-Up: At [facility], you and your health needs are our priority.  As part of our continuing mission to provide you with exceptional heart care, we have created designated Provider Care Teams.  These Care Teams include your primary Cardiologist (physician) and Advanced Practice Providers (APPs -  Physician Assistants and Nurse Practitioners) who all work together to provide you with the care you need, when you need it.  We recommend signing up for the patient portal called "MyChart".  Sign up information is provided on this After Visit Summary.  MyChart is used to connect with patients for Virtual Visits (Telemedicine).  Patients are able to view lab/test results, encounter notes, upcoming appointments, etc.  Non-urgent messages can be sent to your provider as well.   To learn more about what you can do with MyChart, go to [email or web address].    Your next appointment:   1 year(s)  The format for your next appointment:   In Person  Provider:   You may see Pixie Casino, MD or one of the following Advanced Practice Providers on your designated Care Team:    Almyra Deforest, PA-C  Fabian Sharp, PA-C or   Roby Lofts, [place]

## 2019-12-27 NOTE — Progress Notes (Signed)
OFFICE NOTE  Chief Complaint:  No complaints  Primary Care Physician: Redmond School, MD  HPI:  [name] is a 86 year old gentleman who had bypass in 2006, a normal nuclear study in 2009 and has done well without any episodes of angina. He has remained active and does exercise several times a week and has had no worsening shortness of breath, palpitations, presyncope or syncopal symptoms. He also has a history of right carotid disease which he was told to be about 60% at the time of bypass, however, recent Dopplers show only mild disease. He does have a very faint bruit. He last saw Tarri Fuller, PA-C, in the office this past summer. He was having problems with elevated blood pressures at night. It was recommended that he change his blood pressure medications around, however he did not make that change.  He was recently admitted to [facility] for dehydration in the setting of pneumonia or viral influenza. He was taking his diuretics in addition to ongoing fluid losses. This cause orthostatic hypotension. His diuretic was held and his lisinopril was stopped due to worsening renal function. He was switched to amlodipine for better blood pressure control, but has not been taking the medicine due to confusion of his medicines. Currently reports his blood pressures have improved and is not have any significant swelling. In fact he is now hypertensive again. He is recently switched from warfarin over to Xarelto for better control of anticoagulation for his A. fib. He seems to be able to get the medication now with out undue cost.  Mr.Paganelli turns today for followup. He occasionally gets some lightheadedness. He's noted some blood pressures up into the 190s at home. He says however he feels bad when his blood pressure is more in the 120s to 130s.  I saw Demarus back today in the office. Overall he reports doing fairly well. He decreased his exercise somewhat due to his wife being sick but is  interested in getting back into exercise routine. His last stress test was 7 years ago. His bypass was now 10 years ago. He denies any chest pain or worsening shortness of breath. Blood pressure is running somewhat high. His diuretics were stopped due to worsening renal function which seems to have normalized.  Mr. Wernick returns today for follow-up. His main complaint has to do with pain in his back and decreased function in his legs. He says it's very difficult for him to get around. Although he has pain in his legs his symptoms are not necessarily consistent with claudication. He does have a history of decreased ABIs in the past and has not had lower extremity arterial Dopplers in some time. I was also recently reminded that he does have a history of renal artery aneurysm which was noted on the CT scan. This has not been reassessed in several years. He denies any significant symptoms with atrial fibrillation has had no bleeding problems on Savaysa.  01/04/16  Mr. Rudie was seen today in follow-up. He denies any worsening chest pain or shortness of breath. He continues to have some tightness in his legs. He had normal ABIs by Dopplers last year however will need a repeat of that. His renal Dopplers also failed to indicate any stenosis. He is not in atrial fibrillation today and is concerned about the cost of Savaysa. He is interested in switching NOACs. He also brought lab work from the [place] today which showed adequate thyroid levels, elevated uric acid at 8.1  and LDL of 27 with total cholesterol of 115. He is not on statins.  12/23/2016  Mr. Bamberg returns today for follow-up.  Over the past year he has done well.  He denies any worsening shortness of breath or chest pain.  He had lower extremity Dopplers and carotid Dopplers this past year which indicate no significant insufficiency or stenosis.  He denies any recurrent atrial fibrillation.  He switched from [facility] over to Eliquis.  He has not had any  recent lab work.  He tells me his last lipid profile which I reviewed from the [place] showed his total cholesterol of 115.  He has not been on statin therapy due to this.  Fortunately, he has not had any recurrent coronary disease.  He has had no progressive peripheral arterial disease as well.  12/29/2017  Mr. Schulke is seen today in annual follow-up.  Overall he is doing well denies any chest pain or worsening shortness of breath.  Carotid Doppler showed no significant carotid stenosis.  He does complain of some neck pain which is likely arthritis.  He has been tolerating Eliquis without bleeding problems.  He has had improvement in swelling on low-dose thiazide.  Cholesterol recently well controlled with total 189, triglycerides 65, HDL 56 and LDL 40.  01/03/2019  Mr. Seiter is seen today for routine follow-up.  He continues to do well.  Weight is back up about 6 pounds.  He had at one point been below 190 pounds.  He denies chest pain or worsening shortness of breath.  His blood pressure this morning was 130/70 but is elevated some today in the office.  He does report some issues with ongoing urinary frequency particularly at night.  He asked about changing around his medications however this is primarily managed by the [place].  He denies any palpitations.  EKG today shows he is maintaining sinus rhythm with first-degree AV block.  He had no bleeding problems on Eliquis which she takes regularly.  Recent labs showed LDL of 51.  12/27/2019  Mr. Naab returns today for follow-up.  He recently was diagnosed with a throat cancer.  He underwent partial resection and radiation therapy and seems to be doing well.  He said a PET scan indicated no evidence of any spread of the cancer.  He struggled with hoarseness of his voice.  He denies any chest pain or shortness of breath.  He says he was able to play golf throughout the summer without any issues.  EKG shows a sinus bradycardia first-degree AV block.  He is not  had any recurrent A. fib that he is aware of.  PMHx:  Past Medical History:  Diagnosis Date  . BPH (benign prostatic hyperplasia)   . CAD (coronary artery disease) of artery bypass graft 2006  . Fatty liver   . GERD (gastroesophageal reflux disease)   . Gilbert's syndrome   . Hiatal hernia   . History of nuclear stress test 11/2007   bruce myoview; normal pattern of persuion in all regions; post-stress EF 70%; low risk   . HTN (hypertension)   . Neuropathy   . PAF (paroxysmal atrial fibrillation) ([place])   . PVD (peripheral vascular disease) ([place])    0-49% R & L ICA stenosis (2013)   . Renal artery aneurysm ([place])   . S/P CABG (coronary artery bypass graft) 2006  . Schatzki's ring    non critical  . Thrombocytopenia ([place])     Past Surgical History:  Procedure Laterality Date  .  CARDIOVERSION N/A 01/14/2018   Procedure: CARDIOVERSION;  Surgeon: [name], MD;  Location: [facility] ENDOSCOPY;  Service: Cardiovascular;  Laterality: N/A;  . CATARACT EXTRACTION  2011  . COLONOSCOPY  2006   Dr. Margarito Courser pancolonic diverticula  . COLONOSCOPY  01/07/2012   Dr. Abbe Amsterdam normal rectum, pancolonic diverticulosis, hyperplastic polyp  . COLONOSCOPY N/A 03/20/2015   VOP:[id number] diverticulosis  . CORONARY ARTERY BYPASS GRAFT  04/2004   LIMA to diagonal, SVG to ramus intermedius, SVG to PDA; placed stent for crossed coronary arteries (Dr. Prescott Gum)  . ESOPHAGOGASTRODUODENOSCOPY  2006   Dr. Ruthell Rummage Schatzki's ring, s/p 58-F Maloney dilation  . ESOPHAGOGASTRODUODENOSCOPY  01/07/2012   Dr. Gareth Morgan ring, hiatal hernia, fundic gland polyp  . ESOPHAGOGASTRODUODENOSCOPY N/A 03/20/2015   NOT:RRNHA 1 varices/HH Gastric polpy s/p bx  . ESOPHAGOGASTRODUODENOSCOPY N/A 07/28/2018   Dr. Gala Romney: 3 short columns of grade 1 esophageal varices.  Esophageal dilation performed for dysphagia.  Multiple gastric polyps previously proved benign.  Marland Kitchen MALONEY DILATION N/A 07/28/2018    Procedure: Venia Minks DILATION;  Surgeon: Daneil Dolin, MD;  Location[facility];  Service: Endoscopy;  Laterality: N/A;  . rotater cuff     repair  . TONSILLECTOMY      FAMHx:  Family History  Problem Relation Age of Onset  . Heart attack Mother 67       deceased  . Heart attack Father 16       deceased  . Heart disease Brother        x2  . Diabetes Brother        x3  . Colon cancer Neg Hx     SOCHx:   reports that he quit smoking about 15 years ago. His smoking use included cigarettes. He has a 40.00 pack-year smoking history. He has never used smokeless tobacco. He reports current alcohol use. He reports that he does not use drugs.  ALLERGIES:  Allergies  Allergen Reactions  . Antihistamines, Chlorpheniramine-Type Rash    Patient states he cant take large doses of antihistamines also causes prostate problems    ROS: Pertinent items noted in HPI and remainder of comprehensive ROS otherwise negative.  HOME MEDS: Current Outpatient Medications  Medication Sig Dispense Refill  . albuterol (PROVENTIL HFA;VENTOLIN HFA) 108 (90 Base) MCG/ACT inhaler Inhale 1-2 puffs into the lungs every 6 (six) hours as needed for wheezing or shortness of breath.     . allopurinol (ZYLOPRIM) 100 MG tablet Take 100 mg by mouth at bedtime.     . Alpha-D-Galactosidase (BEANO ULTRA 800) TABS Take 1 tablet by mouth daily as needed (gas).    Marland Kitchen amLODipine (NORVASC) 5 MG tablet Take 5 mg by mouth daily.     Marland Kitchen apixaban (ELIQUIS) 5 MG TABS tablet Take 1 tablet (5 mg total) by mouth 2 (two) times daily. 180 tablet 3  . finasteride (PROSCAR) 5 MG tablet Take 5 mg by mouth at bedtime.     . fluticasone (FLONASE) 50 MCG/ACT nasal spray Place 1 spray into both nostrils 2 (two) times a day.    . gabapentin (NEURONTIN) 300 MG capsule Take 300 mg by mouth 3 (three) times daily.      . hydrochlorothiazide (HYDRODIURIL) 25 MG tablet Take 0.5 tablets (12.5 mg total) by mouth daily. 45 tablet 2  .  Levothyroxine Sodium 112 MCG CAPS Take 112 mcg by mouth daily before breakfast.     . losartan (COZAAR) 100 MG tablet Take 50 mg by mouth daily.    Marland Kitchen  metoprolol tartrate (LOPRESSOR) 25 MG tablet Take 1 tablet (25 mg total) by mouth 2 (two) times daily. *NEEDS OFFICE VISIT FOR FURTHER REFILLS* (Patient taking differently: Take 12.5 mg by mouth 2 (two) times daily. *NEEDS OFFICE VISIT FOR FURTHER REFILLS*) 60 tablet 0  . RABEprazole (ACIPHEX) 20 MG tablet Take 1 tablet (20 mg total) by mouth 2 (two) times daily. 180 tablet 3  . tamsulosin (FLOMAX) 0.4 MG CAPS capsule Take 0.4 mg by mouth at bedtime.     . triamcinolone cream (KENALOG) 0.1 % Apply 1 application topically 2 (two) times daily as needed (FOR ITCHING/).     No current facility-administered medications for this visit.    LABS/IMAGING: No results found for this or any previous visit (from the past 48 hour(s)). No results found.  VITALS: BP (!) 150/78   Pulse (!) 57   Temp 97.7 F (36.5 C)   Ht 6' (1.829 m)   Wt 203 lb 3.2 oz (92.2 kg)   SpO2 99%   BMI 27.56 kg/m   EXAM: General appearance: alert and no distress Neck: no carotid bruit and no JVD Lungs: clear to auscultation bilaterally Heart: regular rate and rhythm, S1, S2 normal, no murmur, click, rub or gallop Abdomen: soft, non-tender; bowel sounds normal; no masses,  no organomegaly Extremities: extremities normal, atraumatic, no cyanosis or edema Pulses: 2+ and symmetric Skin: Skin color, texture, turgor normal. No rashes or lesions Neurologic: Grossly normal Psych: Mood, affect normal  EKG: Sinus bradycardia first-degree AV block at 57, LVH by voltage-personally reviewed  ASSESSMENT: 1. Coronary artery disease status post three-vessel CABG in 2006 (LIMA to diagonal, SVG to PDA and SVG to ramus intermedius) 2. Aortic murmur 3. Negative Myoview stress test in 2016  4. Hypertension 5. Mild carotid artery disease 6. Mild dyspnea with marked exertion 7. PAF-on  Eliquis 8. Leg pain and weakness with walking 9. Renal Artery aneurysm 10. NAFLD 11. ?vocal cord tumor, s/p excision and radiation therapy  PLAN: 1.   Mr. Cornette denies any chest pain or worsening shortness of breath.  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  He continues to be hoarse.  He has improved somewhat with speech therapy.  He denies any recurrent atrial fibrillation.  Blood pressure is a little elevated today.  I have advised him to take some home blood pressure readings and may increase his amlodipine slightly.  He is also noted to have a more prominent aortic murmur today.  He had an echo in 2019 showing some aortic sclerosis.  Would like to get a repeat echo to make sure there is not any evidence of stenosis at this point.  Plan follow-up annually or sooner as necessary.  Pixie Casino, MD, [facility], [place] Director of the Advanced Lipid Disorders &  Cardiovascular Risk Reduction Clinic Attending Cardiologist  Direct Dial: [phone]  Fax: [phone]  Website:  [email or web address] [place] Olamide Carattini 12/27/2019, 11:54 AM

## 2020-02-02 ENCOUNTER — Telehealth: Payer: Self-pay | Admitting: Internal Medicine

## 2020-02-02 NOTE — Telephone Encounter (Signed)
Spoke with patient regarding Echo appointment scheduled 02/08/20 at 3:00pm at [facility] time is 2:30 pm for check in---patient voiced his understanding.

## 2020-02-03 DIAGNOSIS — E063 Autoimmune thyroiditis: Secondary | ICD-10-CM | POA: Diagnosis not present

## 2020-02-03 DIAGNOSIS — I10 Essential (primary) hypertension: Secondary | ICD-10-CM | POA: Diagnosis not present

## 2020-02-03 DIAGNOSIS — G894 Chronic pain syndrome: Secondary | ICD-10-CM | POA: Diagnosis not present

## 2020-02-08 ENCOUNTER — Other Ambulatory Visit: Payer: Self-pay

## 2020-02-08 ENCOUNTER — Ambulatory Visit (HOSPITAL_COMMUNITY)
Admission: RE | Admit: 2020-02-08 | Discharge: 2020-02-08 | Disposition: A | Discharge status: Home / Self Care | Payer: Medicare Other | Source: Ambulatory Visit | Attending: Internal Medicine | Admitting: Internal Medicine

## 2020-02-08 DIAGNOSIS — R011 Cardiac murmur, unspecified: Secondary | ICD-10-CM | POA: Diagnosis not present

## 2020-02-08 LAB — ECHOCARDIOGRAM COMPLETE
AR max vel: 1.35 cm2
AV Area VTI: 1.43 cm2
AV Area mean vel: 1.42 cm2
AV Mean grad: 10 mmHg
AV Peak grad: 23.4 mmHg
Ao pk vel: 2.42 m/s
Area-P 1/2: 2.8 cm2
S' Lateral: 3.1 cm

## 2020-02-08 NOTE — Progress Notes (Signed)
*  PRELIMINARY RESULTS* Echocardiogram 2D Echocardiogram has been performed.  Samuel Germany 02/08/2020, 3:58 PM

## 2020-02-17 DIAGNOSIS — U071 COVID-19: Secondary | ICD-10-CM | POA: Diagnosis not present

## 2020-02-24 ENCOUNTER — Telehealth: Payer: Self-pay | Admitting: Internal Medicine

## 2020-02-24 NOTE — Telephone Encounter (Signed)
New message:     Patient calling stating he has had covid-19 for a week. Patient would like to know if he can get something to take. He has been taken cosening 10-100 mg, but that is not working. Please advise. Patient sounds very congested.

## 2020-02-24 NOTE — Telephone Encounter (Signed)
    Pt is calling to follow up

## 2020-02-24 NOTE — Telephone Encounter (Signed)
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 He states he has covid, has had symptoms for over 1 week. He called his PCP but did not get much advice, per his report. He asked if OK to take dayquil/nyquil, mucinex DM. Advised OK to take OTC meds that do not end in -D or -DM preferably. He was advised OK to take vit C, D, zinc, melatonin. Advised he contact his PCP to see if he can prescribe oral COVID therapy. Also provided him number for COVID care clinic in [place]. Advised we can also do referral for COVID treatment but that would not be a guarantee of receiving therapy.

## 2020-03-02 DIAGNOSIS — H0015 Chalazion left lower eyelid: Secondary | ICD-10-CM | POA: Diagnosis not present

## 2020-03-02 DIAGNOSIS — H02124 Mechanical ectropion of left upper eyelid: Secondary | ICD-10-CM | POA: Diagnosis not present

## 2020-03-02 DIAGNOSIS — H53041 Amblyopia suspect, right eye: Secondary | ICD-10-CM | POA: Diagnosis not present

## 2020-03-02 DIAGNOSIS — H02125 Mechanical ectropion of left lower eyelid: Secondary | ICD-10-CM | POA: Diagnosis not present

## 2020-03-17 ENCOUNTER — Other Ambulatory Visit: Payer: Self-pay | Admitting: Internal Medicine

## 2020-04-20 ENCOUNTER — Other Ambulatory Visit: Payer: Self-pay | Admitting: Adult Health

## 2020-05-02 DIAGNOSIS — I10 Essential (primary) hypertension: Secondary | ICD-10-CM | POA: Diagnosis not present

## 2020-05-02 DIAGNOSIS — E7849 Other hyperlipidemia: Secondary | ICD-10-CM | POA: Diagnosis not present

## 2020-06-01 DIAGNOSIS — H10013 Acute follicular conjunctivitis, bilateral: Secondary | ICD-10-CM | POA: Diagnosis not present

## 2020-06-05 DIAGNOSIS — H00025 Hordeolum internum left lower eyelid: Secondary | ICD-10-CM | POA: Diagnosis not present

## 2020-07-03 ENCOUNTER — Other Ambulatory Visit: Payer: Self-pay | Admitting: Internal Medicine

## 2020-07-12 DIAGNOSIS — N138 Other obstructive and reflux uropathy: Secondary | ICD-10-CM | POA: Diagnosis not present

## 2020-08-02 DIAGNOSIS — I10 Essential (primary) hypertension: Secondary | ICD-10-CM | POA: Diagnosis not present

## 2020-08-02 DIAGNOSIS — E7849 Other hyperlipidemia: Secondary | ICD-10-CM | POA: Diagnosis not present

## 2020-09-27 ENCOUNTER — Telehealth: Payer: Self-pay

## 2020-09-27 DIAGNOSIS — I480 Paroxysmal atrial fibrillation: Secondary | ICD-10-CM

## 2020-09-27 DIAGNOSIS — Z01818 Encounter for other preprocedural examination: Secondary | ICD-10-CM

## 2020-09-27 NOTE — Telephone Encounter (Signed)
   [facility] Pre-operative Risk Assessment    Patient Name: [name]  DOB: May 28, 1933 MRN: [id number]  HEARTCARE STAFF:  - IMPORTANT!!!!!! Under Visit Info/Reason for Call, type in Other and utilize the format Clearance MM/DD/YY or Clearance TBD. Do not use dashes or single digits. - Please review there is not already an duplicate clearance open for this procedure. - If request is for dental extraction, please clarify the # of teeth to be extracted. - If the patient is currently at the dentist's office, call Pre-Op Callback Staff (MA/nurse) to input urgent request.  - If the patient is not currently in the dentist office, please route to the Pre-Op pool.  Request for surgical clearance:  What type of surgery is being performed? Ectropion Repair LLL  When is this surgery scheduled? 10/24/20  What type of clearance is required (medical clearance vs. Pharmacy clearance to hold med vs. Both)? Pharmacy   Are there any medications that need to be held prior to surgery and how long? Eliquis  Practice name and name of physician performing surgery? [facility] Dr. Magda Kiel   What is the office phone number? [phone]   7.   What is the office fax number? [phone]  8.   Anesthesia type (None, local, MAC, general) ? Unknown    Zebedee Iba 09/27/2020, 11:22 AM  _________________________________________________________________   (provider comments below)

## 2020-09-28 NOTE — Telephone Encounter (Signed)
Left detailed message for the pt to call back. Pharm-d is requesting to see if pt has had lab BMP work within the last year with the [place]. If pt has not had lab work done with the [place] he will need to come into the office for lab work.

## 2020-09-28 NOTE — Telephone Encounter (Signed)
Patient with diagnosis of afib on Eliquis for anticoagulation.    Procedure:  Ectropion Repair LLL Date of procedure: 10/24/20   CHA2DS2-VASc Score = 4  This indicates a 4.8% annual risk of stroke. The patient's score is based upon: CHF History: No HTN History: Yes Diabetes History: No Stroke History: No Vascular Disease History: Yes Age Score: 2 Gender Score: 0     Preop call back :Can we call patient and see if he has had labs done at the [place] recently. Looking for a BMP within the last year

## 2020-10-01 NOTE — Telephone Encounter (Signed)
Left message for pt to call back and ask to s/w the pre op team. See previous notes

## 2020-10-03 NOTE — Telephone Encounter (Signed)
Left message x 3

## 2020-10-04 NOTE — Telephone Encounter (Signed)
Spoke with pt.  [facility] to have them [facility] over his [facility] work to [place].  I advised that pt that this was the only thing holding him up from his clearance being processed.  Pt was also made aware that he could go to [place] for labs, that way he wouldn't have to come down to [place].  Pt said he will call the [place] and let us know.

## 2020-10-09 NOTE — Addendum Note (Signed)
Addended by: Michae Kava on: 10/09/2020 03:14 PM   Modules accepted: Orders

## 2020-10-09 NOTE — Telephone Encounter (Signed)
Ph# for [place] [facility] [phone].

## 2020-10-09 NOTE — Telephone Encounter (Signed)
Dr. Magda Kiel office called checking on clearance for the pt. I informed Dr. Marveen Reeks office that we are trying to obtain lab work from the [place] for clearance. I did also state that we have informed the pt that we may need to just have him come in for lab work if we cannot get labs from the [place]. I assured requesting office that I will fax these notes as an update.

## 2020-10-09 NOTE — Telephone Encounter (Signed)
I s/w the pt this morning to see if he was able to get the labs sent over from the [place] in order to see if we can get him cleared for his pre op clearance. Pt states that the [facility] told him they faxed labs over to Dr. Debara Pickett. I assured the pt that I will try to call the [place] as I did not see the labs scanned into the pt's chart. I asked the pt did he just want to have lab work done in [place], as to not delay his procedure; see previous note. Pt said call the [facility] first and see about the labs. If he needs too then he will go to [facility] for lab work.   I tried x 2 to reach the [place] in [place] per the pt and both times was on hold for 10+ minutes. I will also check with our [facility] office and check with Dr. Lysbeth Penner nurse Eliezer Lofts to see if she received labs from [place].

## 2020-10-09 NOTE — Telephone Encounter (Signed)
I called and s/w the pt again this afternoon to let him know that we do not have the lab results from the [place]. I also stated that I myself had tried to call the [facility] in [place] per the pt and each time I was on hold for 10+ minutes with no one answering the phone. I assured the pt that I asked Dr. Lysbeth Penner nurse Eliezer Lofts, who states she has not seen the lab results from the [place]. I stated that I felt it was in the best interest of the pt that we just go ahead and get lab work so we can finalize his clearance request. Pt is agreeable to plan of care. Order has been placed for BMP to be done at [facility] which is where the pt stated he will go for the lab work tomorrow 10/10/20. Pt thanked me for the call and the help again today.

## 2020-10-10 ENCOUNTER — Other Ambulatory Visit (HOSPITAL_COMMUNITY)
Admission: RE | Admit: 2020-10-10 | Discharge: 2020-10-10 | Disposition: A | Discharge status: Home / Self Care | Payer: Medicare Other | Source: Ambulatory Visit | Attending: Pharmacist | Admitting: Pharmacist

## 2020-10-10 ENCOUNTER — Other Ambulatory Visit: Payer: Self-pay

## 2020-10-10 DIAGNOSIS — Z01818 Encounter for other preprocedural examination: Secondary | ICD-10-CM | POA: Diagnosis not present

## 2020-10-10 DIAGNOSIS — I480 Paroxysmal atrial fibrillation: Secondary | ICD-10-CM | POA: Diagnosis not present

## 2020-10-10 LAB — BASIC METABOLIC PANEL
Anion gap: 7 (ref 5–15)
BUN: 15 mg/dL (ref 8–23)
CO2: 25 mmol/L (ref 22–32)
Calcium: 8.8 mg/dL — ABNORMAL LOW (ref 8.9–10.3)
Chloride: 104 mmol/L (ref 98–111)
Creatinine, Ser: 1.24 mg/dL (ref 0.61–1.24)
GFR, Estimated: 57 mL/min — ABNORMAL LOW (ref >60)
Glucose, Bld: 141 mg/dL — ABNORMAL HIGH (ref 70–99)
Potassium: 3.8 mmol/L (ref 3.5–5.1)
Sodium: 136 mmol/L (ref 135–145)

## 2020-10-10 NOTE — Telephone Encounter (Signed)
Crcl is 47.8 Patient may hold Eliquis for 2 days prior to procedure.

## 2020-10-10 NOTE — Telephone Encounter (Signed)
   Name: [name]  DOB: [birth year]  MRN: [id number]   Primary Cardiologist: Pixie Casino, MD  Chart reviewed as part of pre-operative protocol coverage. We have been ask for guidance to hold [name].   Per our clinical pharmacist: Patient may hold Eliquis for 2 days prior to procedure. Resume as soon after when safe.   I will route this recommendation to the requesting party via Epic fax function and remove from pre-op pool. Please call with questions.  [place], PA 10/10/2020, 12:15 PM

## 2020-12-23 DIAGNOSIS — I10 Essential (primary) hypertension: Secondary | ICD-10-CM | POA: Diagnosis not present

## 2020-12-23 DIAGNOSIS — R52 Pain, unspecified: Secondary | ICD-10-CM | POA: Diagnosis not present

## 2020-12-23 DIAGNOSIS — M79603 Pain in arm, unspecified: Secondary | ICD-10-CM | POA: Diagnosis not present

## 2020-12-24 ENCOUNTER — Telehealth: Payer: Self-pay | Admitting: Internal Medicine

## 2020-12-24 NOTE — Telephone Encounter (Signed)
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 He has not been seen in about 1 year. He reports left arm pain/numbness since yesterday. He reports no facial droop, slurred speech, unilateral weakness. He went to fire dept yesterday and they did not think he was having CVA. He has NTG but has not used. Scheduled him for acute DOD appt tomorrow 12/24/20 with Dr. Debara Pickett

## 2020-12-25 ENCOUNTER — Ambulatory Visit: Payer: Medicare Other | Admitting: Internal Medicine

## 2020-12-25 ENCOUNTER — Other Ambulatory Visit: Payer: Self-pay

## 2020-12-25 ENCOUNTER — Encounter: Payer: Self-pay | Admitting: Internal Medicine

## 2020-12-25 VITALS — BP 122/59 | HR 67 | Ht 68.05 in | Wt 196.2 lb

## 2020-12-25 DIAGNOSIS — M79602 Pain in left arm: Secondary | ICD-10-CM

## 2020-12-25 DIAGNOSIS — Z951 Presence of aortocoronary bypass graft: Secondary | ICD-10-CM | POA: Diagnosis not present

## 2020-12-25 DIAGNOSIS — I10 Essential (primary) hypertension: Secondary | ICD-10-CM

## 2020-12-25 DIAGNOSIS — I493 Ventricular premature depolarization: Secondary | ICD-10-CM | POA: Diagnosis not present

## 2020-12-25 MED ORDER — NITROGLYCERIN 0.4 MG SL SUBL: 0.4000 mg | SUBLINGUAL_TABLET | SUBLINGUAL | 3 refills | Status: DC | PRN

## 2020-12-25 NOTE — Progress Notes (Signed)
OFFICE NOTE  Chief Complaint:  Acute visit for left arm pain  Primary Care Physician: Redmond School, MD  HPI:  [name] is a 87 year old gentleman who had bypass in 2006, a normal nuclear study in 2009 and has done well without any episodes of angina. He has remained active and does exercise several times a week and has had no worsening shortness of breath, palpitations, presyncope or syncopal symptoms. He also has a history of right carotid disease which he was told to be about 60% at the time of bypass, however, recent Dopplers show only mild disease. He does have a very faint bruit. He last saw Tarri Fuller, PA-C, in the office this past summer. He was having problems with elevated blood pressures at night. It was recommended that he change his blood pressure medications around, however he did not make that change.  He was recently admitted to [facility] for dehydration in the setting of pneumonia or viral influenza. He was taking his diuretics in addition to ongoing fluid losses. This cause orthostatic hypotension. His diuretic was held and his lisinopril was stopped due to worsening renal function. He was switched to amlodipine for better blood pressure control, but has not been taking the medicine due to confusion of his medicines. Currently reports his blood pressures have improved and is not have any significant swelling. In fact he is now hypertensive again. He is recently switched from warfarin over to Xarelto for better control of anticoagulation for his A. fib. He seems to be able to get the medication now with out undue cost.  [name] turns today for followup. He occasionally gets some lightheadedness. He's noted some blood pressures up into the 190s at home. He says however he feels bad when his blood pressure is more in the 120s to 130s.  I saw [name] today in the office. Overall he reports doing fairly well. He decreased his exercise somewhat due to his wife  being sick but is interested in getting [name] into exercise routine. His last stress test was 7 years ago. His bypass was now 10 years ago. He denies any chest pain or worsening shortness of breath. Blood pressure is running somewhat high. His diuretics were stopped due to worsening renal function which seems to have normalized.  [name] returns today for follow-up. His main complaint has to do with pain in his [name] and decreased function in his legs. He says it's very difficult for him to get around. Although he has pain in his legs his symptoms are not necessarily consistent with claudication. He does have a history of decreased ABIs in the past and has not had lower extremity arterial Dopplers in some time. I was also recently reminded that he does have a history of renal artery aneurysm which was noted on the CT scan. This has not been reassessed in several years. He denies any significant symptoms with atrial fibrillation has had no bleeding problems on Savaysa.  01/04/16  [name] was seen today in follow-up. He denies any worsening chest pain or shortness of breath. He continues to have some tightness in his legs. He had normal ABIs by Dopplers last year however will need a repeat of that. His renal Dopplers also failed to indicate any stenosis. He is not in atrial fibrillation today and is concerned about the cost of Savaysa. He is interested in switching NOACs. He also brought lab work from the [place] today which showed adequate thyroid levels, elevated  uric acid at 8.1 and LDL of 27 with total cholesterol of 115. He is not on statins.  12/23/2016  [name] returns today for follow-up.  Over the past year he has done well.  He denies any worsening shortness of breath or chest pain.  He had lower extremity Dopplers and carotid Dopplers this past year which indicate no significant insufficiency or stenosis.  He denies any recurrent atrial fibrillation.  He switched from [facility] over to Eliquis.  He  has not had any recent lab work.  He tells me his last lipid profile which I reviewed from the [place] showed his total cholesterol of 115.  He has not been on statin therapy due to this.  Fortunately, he has not had any recurrent coronary disease.  He has had no progressive peripheral arterial disease as well.  12/29/2017  [name] is seen today in annual follow-up.  Overall he is doing well denies any chest pain or worsening shortness of breath.  Carotid Doppler showed no significant carotid stenosis.  He does complain of some neck pain which is likely arthritis.  He has been tolerating Eliquis without bleeding problems.  He has had improvement in swelling on low-dose thiazide.  Cholesterol recently well controlled with total 189, triglycerides 65, HDL 56 and LDL 40.  01/03/2019  [name] is seen today for routine follow-up.  He continues to do well.  Weight is [name] up about 6 pounds.  He had at one point been below 190 pounds.  He denies chest pain or worsening shortness of breath.  His blood pressure this morning was 130/70 but is elevated some today in the office.  He does report some issues with ongoing urinary frequency particularly at night.  He asked about changing around his medications however this is primarily managed by the [place].  He denies any palpitations.  EKG today shows he is maintaining sinus rhythm with first-degree AV block.  He had no bleeding problems on Eliquis which she takes regularly.  Recent labs showed LDL of 51.  12/27/2019  [name] returns today for follow-up.  He recently was diagnosed with a throat cancer.  He underwent partial resection and radiation therapy and seems to be doing well.  He said a PET scan indicated no evidence of any spread of the cancer.  He struggled with hoarseness of his voice.  He denies any chest pain or shortness of breath.  He says he was able to play golf throughout the summer without any issues.  EKG shows a sinus bradycardia first-degree AV  block.  He is not had any recurrent A. fib that he is aware of.  12/25/2020  [name] is seen today for an acute visit regarding left arm pain.  There is no associated chest pain however he had primarily left arm pain and weakness prior to his bypass surgery in 2006.  His symptoms were significant enough that he went ahead and called EMS.  An EKG was performed and a rhythm strip which captured frequent PVCs.  I personally reviewed that today.  His EKG in the office however shows no ectopy.  In fact there is a sinus rhythm with first-degree AV block and LVH by voltage at 67, unchanged from prior EKG.  Blood pressures well controlled today.  He said he eventually took nitroglycerin and his symptoms eased off however the prescription is quite old and barely made any burning under his tongue.  He denies any worsening symptoms with exertion or lifting.  His last stress test was in 2016 which was negative for ischemia  PMHx:  Past Medical History:  Diagnosis Date   BPH (benign prostatic hyperplasia)    CAD (coronary artery disease) of artery bypass graft 2006   Fatty liver    GERD (gastroesophageal reflux disease)    Gilbert's syndrome    Hiatal hernia    History of nuclear stress test 11/2007   bruce myoview; normal pattern of persuion in all regions; post-stress EF 70%; low risk    HTN (hypertension)    Neuropathy    PAF (paroxysmal atrial fibrillation) (HCC)    PVD (peripheral vascular disease) (HCC)    0-49% R & L ICA stenosis (2013)    Renal artery aneurysm (HCC)    S/P CABG (coronary artery bypass graft) 2006   Schatzki's ring    non critical   Thrombocytopenia ([place])     Past Surgical History:  Procedure Laterality Date   CARDIOVERSION N/A 01/14/2018   Procedure: CARDIOVERSION;  Surgeon: [name], MD;  Location: [facility] ENDOSCOPY;  Service: Cardiovascular;  Laterality: N/A;   CATARACT EXTRACTION  2011   COLONOSCOPY  2006   Dr. Margarito Courser pancolonic diverticula    COLONOSCOPY  01/07/2012   Dr. Abbe Amsterdam normal rectum, pancolonic diverticulosis, hyperplastic polyp   COLONOSCOPY N/A 03/20/2015   VFI:EPPIRJJOAC diverticulosis   CORONARY ARTERY BYPASS GRAFT  04/2004   LIMA to diagonal, SVG to ramus intermedius, SVG to PDA; placed stent for crossed coronary arteries (Dr. Prescott Gum)   ESOPHAGOGASTRODUODENOSCOPY  2006   Dr. Ruthell Rummage Schatzki's ring, s/p 58-F Maloney dilation   ESOPHAGOGASTRODUODENOSCOPY  01/07/2012   Dr. Gareth Morgan ring, hiatal hernia, fundic gland polyp   ESOPHAGOGASTRODUODENOSCOPY N/A 03/20/2015   ZYS:AYTKZ 1 varices/HH Gastric polpy s/p bx   ESOPHAGOGASTRODUODENOSCOPY N/A 07/28/2018   Dr. Gala Romney: 3 short columns of grade 1 esophageal varices.  Esophageal dilation performed for dysphagia.  Multiple gastric polyps previously proved benign.   MALONEY DILATION N/A 07/28/2018   Procedure: Venia Minks DILATION;  Surgeon: Daneil Dolin, MD;  Location: [facility] ENDO SU[facility];  Service: Endoscopy;  Laterality: N/A;   rotater cuff     repair   TONSILLECTOMY      FAMHx:  Family History  Problem Relation Age of Onset   Heart attack Mother 102       deceased   Heart attack Father 85       deceased   Heart disease Brother        x2   Diabetes Brother        x3   Colon cancer Neg Hx     SOCHx:   reports that he quit smoking about 16 years ago. His smoking use included cigarettes. He has a 40.00 pack-year smoking history. He has never used smokeless tobacco. He reports current alcohol use. He reports that he does not use drugs.  ALLERGIES:  Allergies  Allergen Reactions   Antihistamines, Chlorpheniramine-Type Rash    Patient states he cant take large doses of antihistamines also causes prostate problems    ROS: Pertinent items noted in HPI and remainder of comprehensive ROS otherwise negative.  HOME MEDS: Current Outpatient Medications  Medication Sig Dispense Refill   albuterol (PROVENTIL HFA;VENTOLIN HFA) 108 (90 Base) MCG/ACT  inhaler Inhale 1-2 puffs into the lungs every 6 (six) hours as needed for wheezing or shortness of breath.      allopurinol (ZYLOPRIM) 100 MG tablet Take 100 mg by mouth at bedtime.      amLODipine (NORVASC) 5  MG tablet Take 5 mg by mouth daily.      apixaban (ELIQUIS) 5 MG TABS tablet Take 1 tablet (5 mg total) by mouth 2 (two) times daily. 180 tablet 3   cyclobenzaprine (FLEXERIL) 10 MG tablet Take 10 mg by mouth 3 (three) times daily as needed for muscle spasms.     doxazosin (CARDURA) 8 MG tablet Take 8 mg by mouth daily.     finasteride (PROSCAR) 5 MG tablet Take 5 mg by mouth at bedtime.      gabapentin (NEURONTIN) 300 MG capsule Take 300 mg by mouth 3 (three) times daily.       hydrochlorothiazide (HYDRODIURIL) 25 MG tablet Take 1/2 (one-half) tablet by mouth once daily 45 tablet 1   ibuprofen (ADVIL) 200 MG tablet Take 200 mg by mouth every 6 (six) hours as needed.     Levothyroxine Sodium 112 MCG CAPS Take 112 mcg by mouth daily before breakfast.      losartan (COZAAR) 100 MG tablet Take 50 mg by mouth daily.     metoprolol tartrate (LOPRESSOR) 25 MG tablet Take 1 tablet by mouth twice daily 180 tablet 2   mirabegron ER (MYRBETRIQ) 25 MG TB24 tablet Take 25 mg by mouth daily.     mometasone (NASONEX) 50 MCG/ACT nasal spray Place 2 sprays into the nose daily.     nitroGLYCERIN (NITROSTAT) 0.4 MG SL tablet Place 0.4 mg under the tongue every 5 (five) minutes as needed for chest pain.     RABEprazole (ACIPHEX) 20 MG tablet Take 1 tablet (20 mg total) by mouth 2 (two) times daily. 180 tablet 3   tamsulosin (FLOMAX) 0.4 MG CAPS capsule Take 0.4 mg by mouth at bedtime.      triamcinolone cream (KENALOG) 0.1 % Apply 1 application topically 2 (two) times daily as needed (FOR ITCHING/).     Alpha-D-Galactosidase (BEANO ULTRA 800) TABS Take 1 tablet by mouth daily as needed (gas). (Patient not taking: Reported on 12/25/2020)     fluticasone (FLONASE) 50 MCG/ACT nasal spray Place 1 spray into both  nostrils 2 (two) times a day. (Patient not taking: Reported on 12/25/2020)     No current facility-administered medications for this visit.    LABS/IMAGING: No results found for this or any previous visit (from the past 48 hour(s)). No results found.  VITALS: BP (!) 122/59   Pulse 67   Ht 5' 8.05" (1.728 m)   Wt 196 lb 3.2 oz (89 kg)   SpO2 99%   BMI 29.79 kg/m   EXAM: General appearance: alert and no distress Neck: no carotid bruit and no JVD Lungs: clear to auscultation bilaterally Heart: regular rate and rhythm, S1, S2 normal, no murmur, click, rub or gallop Abdomen: soft, non-tender; bowel sounds normal; no masses,  no organomegaly Extremities: extremities normal, atraumatic, no cyanosis or edema Pulses: 2+ and symmetric Skin: Skin color, texture, turgor normal. No rashes or lesions Neurologic: Grossly normal Psych: Mood, affect normal  EKG: Sinus rhythm first-degree AV block at 67, LVH by voltage-personally reviewed  ASSESSMENT: Left arm pain Frequent PVCs Coronary artery disease status post three-vessel CABG in 2006 (LIMA to diagonal, SVG to PDA and SVG to ramus intermedius) Aortic murmur Negative Myoview stress test in 2016  Hypertension Mild carotid artery disease Mild dyspnea with marked exertion PAF-on Eliquis Leg pain and weakness with walking Renal Artery aneurysm NAFLD ?vocal cord tumor, s/p excision and radiation therapy  PLAN: 1.   [name] denies any chest pain but  is having some left arm pain which apparently was an associated symptom prior to his CABG.  He was also noted to have frequent PVCs when evaluated by EMS recently for chest pain.  His symptoms seem atypical for cardiac pain however I am concerned more about the frequency of his PVCs and whether this could represent an ischemic symptom.  Would recommend repeat Myoview stress testing as his last study was over 6 years ago.  We will also refill his as needed nitro.  If this is negative, may  consider adjustment in his metoprolol to see if this suppresses PVCs.  Pixie Casino, MD, [facility], [place] Director of the Advanced Lipid Disorders &  Cardiovascular Risk Reduction Clinic Attending Cardiologist  Direct Dial: [phone]  Fax: [phone]  Website:  [email or web address] Charlie 12/25/2020, 9:05 AM

## 2020-12-25 NOTE — Patient Instructions (Signed)
Medication Instructions:  Your physician recommends that you continue on your current medications as directed. Please refer to the Current Medication list given to you today.  *If you need a refill on your cardiac medications before your next appointment, please call your pharmacy*   Testing/Procedures: Dr. Debara Pickett has ordered a Lexiscan Myocardial Perfusion Imaging Study. This is done at [address]  Please arrive 15 minutes prior to your appointment time for registration and insurance purposes.   The test will take approximately 3 to 4 hours to complete; you may bring reading material.  If someone comes with you to your appointment, they will need to remain in the main lobby due to limited space in the testing area. **If you are pregnant or breastfeeding, please notify the nuclear lab prior to your appointment**   How to prepare for your Myocardial Perfusion Test: Do not eat or drink 3 hours prior to your test, except you may have water. Do not consume products containing caffeine (regular or decaffeinated) 12 hours prior to your test. (ex: coffee, chocolate, sodas, tea). Do wear comfortable clothes (no dresses or overalls) and walking shoes, tennis shoes preferred (No heels or open toe shoes are allowed). Do NOT wear cologne, perfume, aftershave, or lotions (deodorant is allowed). If you use an inhaler, use it the AM of your test and bring it with you.  If you use a nebulizer, use it the AM of your test.  If these instructions are not followed, your test will have to be rescheduled.    Follow-Up: At [facility], you and your health needs are our priority.  As part of our continuing mission to provide you with exceptional heart care, we have created designated Provider Care Teams.  These Care Teams include your primary Cardiologist (physician) and Advanced Practice Providers (APPs -  Physician Assistants and Nurse Practitioners) who all work together to provide you  with the care you need, when you need it.  We recommend signing up for the patient portal called "MyChart".  Sign up information is provided on this After Visit Summary.  MyChart is used to connect with patients for Virtual Visits (Telemedicine).  Patients are able to view lab/test results, encounter notes, upcoming appointments, etc.  Non-urgent messages can be sent to your provider as well.   To learn more about what you can do with MyChart, go to [email or web address].    Your next appointment:   AS SCHEDULED for March 2023  If anything is abnormal, we will schedule a follow up if needed

## 2020-12-26 ENCOUNTER — Other Ambulatory Visit: Payer: Self-pay

## 2021-01-02 ENCOUNTER — Telehealth (HOSPITAL_COMMUNITY): Payer: Self-pay

## 2021-01-02 NOTE — Telephone Encounter (Signed)
Spoke with the patient, detailed instructions given. He stated that he would be here for his test. Asked to call back with any questions. S.Miri Jose EMTP

## 2021-01-03 ENCOUNTER — Other Ambulatory Visit: Payer: Self-pay

## 2021-01-03 ENCOUNTER — Ambulatory Visit (HOSPITAL_COMMUNITY): Payer: Medicare Other | Attending: Cardiovascular Disease

## 2021-01-03 DIAGNOSIS — I493 Ventricular premature depolarization: Secondary | ICD-10-CM | POA: Diagnosis not present

## 2021-01-03 DIAGNOSIS — M79602 Pain in left arm: Secondary | ICD-10-CM | POA: Diagnosis not present

## 2021-01-03 DIAGNOSIS — Z951 Presence of aortocoronary bypass graft: Secondary | ICD-10-CM | POA: Insufficient documentation

## 2021-01-03 LAB — MYOCARDIAL PERFUSION IMAGING
LV dias vol: 108 mL (ref 62–150)
LV sys vol: 36 mL
Nuc Stress EF: 67 %
Peak HR: 68 {beats}/min
Rest HR: 56 {beats}/min
Rest Nuclear Isotope Dose: 11 mCi
SDS: 0
SRS: 0
SSS: 0
ST Depression (mm): 0 mm
Stress Nuclear Isotope Dose: 30.6 mCi
TID: 1.01

## 2021-01-03 MED ORDER — REGADENOSON 0.4 MG/5ML IV SOLN
0.4000 mg | Freq: Once | INTRAVENOUS | Status: AC
  Administered 2021-01-03: 0.4 mg via INTRAVENOUS

## 2021-01-03 MED ORDER — TECHNETIUM TC 99M TETROFOSMIN IV KIT
11.0000 | PACK | Freq: Once | INTRAVENOUS | Status: AC | PRN
  Administered 2021-01-03: 11 via INTRAVENOUS
  Filled 2021-01-03: qty 11

## 2021-01-03 MED ORDER — TECHNETIUM TC 99M TETROFOSMIN IV KIT
30.6000 | PACK | Freq: Once | INTRAVENOUS | Status: AC | PRN
  Administered 2021-01-03: 30.6 via INTRAVENOUS
  Filled 2021-01-03: qty 31

## 2021-01-10 DIAGNOSIS — J069 Acute upper respiratory infection, unspecified: Secondary | ICD-10-CM | POA: Diagnosis not present

## 2021-01-18 ENCOUNTER — Other Ambulatory Visit: Payer: Self-pay | Admitting: Internal Medicine

## 2021-03-18 ENCOUNTER — Other Ambulatory Visit: Payer: Self-pay | Admitting: Internal Medicine

## 2021-03-29 ENCOUNTER — Other Ambulatory Visit: Payer: Self-pay | Admitting: Internal Medicine

## 2021-04-05 ENCOUNTER — Ambulatory Visit: Payer: Medicare Other | Admitting: Internal Medicine

## 2021-04-05 ENCOUNTER — Encounter: Payer: Self-pay | Admitting: Internal Medicine

## 2021-04-05 ENCOUNTER — Other Ambulatory Visit: Payer: Self-pay

## 2021-04-05 VITALS — BP 132/61 | HR 59 | Ht 68.0 in | Wt 185.2 lb

## 2021-04-05 DIAGNOSIS — I493 Ventricular premature depolarization: Secondary | ICD-10-CM

## 2021-04-05 DIAGNOSIS — I25810 Atherosclerosis of coronary artery bypass graft(s) without angina pectoris: Secondary | ICD-10-CM

## 2021-04-05 DIAGNOSIS — M79602 Pain in left arm: Secondary | ICD-10-CM | POA: Diagnosis not present

## 2021-04-05 DIAGNOSIS — I10 Essential (primary) hypertension: Secondary | ICD-10-CM

## 2021-04-05 DIAGNOSIS — Z951 Presence of aortocoronary bypass graft: Secondary | ICD-10-CM

## 2021-04-05 NOTE — Patient Instructions (Signed)
Medication Instructions:  ?NO CHANGES ? ?*If you need a refill on your cardiac medications before your next appointment, please call your pharmacy* ? ? ?Follow-Up: ?At [facility], you and your health needs are our priority.  As part of our continuing mission to provide you with exceptional heart care, we have created designated Provider Care Teams.  These Care Teams include your primary Cardiologist (physician) and Advanced Practice Providers (APPs -  Physician Assistants and Nurse Practitioners) who all work together to provide you with the care you need, when you need it. ? ?We recommend signing up for the patient portal called "MyChart".  Sign up information is provided on this After Visit Summary.  MyChart is used to connect with patients for Virtual Visits (Telemedicine).  Patients are able to view lab/test results, encounter notes, upcoming appointments, etc.  Non-urgent messages can be sent to your provider as well.   ?To learn more about what you can do with MyChart, go to [email or web address].   ? ?Your next appointment:   ? ?1 year with Dr. Debara Pickett  ?

## 2021-04-05 NOTE — Progress Notes (Signed)
OFFICE NOTE  Chief Complaint:  Routine follow-up  Primary Care Physician: Redmond School, MD  HPI:  [name] is a 88 year old gentleman who had bypass in 2006, a normal nuclear study in 2009 and has done well without any episodes of angina. He has remained active and does exercise several times a week and has had no worsening shortness of breath, palpitations, presyncope or syncopal symptoms. He also has a history of right carotid disease which he was told to be about 60% at the time of bypass, however, recent Dopplers show only mild disease. He does have a very faint bruit. He last saw Tarri Fuller, PA-C, in the office this past summer. He was having problems with elevated blood pressures at night. It was recommended that he change his blood pressure medications around, however he did not make that change.  He was recently admitted to [facility] for dehydration in the setting of pneumonia or viral influenza. He was taking his diuretics in addition to ongoing fluid losses. This cause orthostatic hypotension. His diuretic was held and his lisinopril was stopped due to worsening renal function. He was switched to amlodipine for better blood pressure control, but has not been taking the medicine due to confusion of his medicines. Currently reports his blood pressures have improved and is not have any significant swelling. In fact he is now hypertensive again. He is recently switched from warfarin over to Xarelto for better control of anticoagulation for his A. fib. He seems to be able to get the medication now with out undue cost.  Mr.[name] turns today for followup. He occasionally gets some lightheadedness. He's noted some blood pressures up into the 190s at home. He says however he feels bad when his blood pressure is more in the 120s to 130s.  I saw [name] back today in the office. Overall he reports doing fairly well. He decreased his exercise somewhat due to his wife being sick  but is interested in getting back into exercise routine. His last stress test was 7 years ago. His bypass was now 10 years ago. He denies any chest pain or worsening shortness of breath. Blood pressure is running somewhat high. His diuretics were stopped due to worsening renal function which seems to have normalized.  [name] returns today for follow-up. His main complaint has to do with pain in his back and decreased function in his legs. He says it's very difficult for him to get around. Although he has pain in his legs his symptoms are not necessarily consistent with claudication. He does have a history of decreased ABIs in the past and has not had lower extremity arterial Dopplers in some time. I was also recently reminded that he does have a history of renal artery aneurysm which was noted on the CT scan. This has not been reassessed in several years. He denies any significant symptoms with atrial fibrillation has had no bleeding problems on Savaysa.  01/04/16  [name] was seen today in follow-up. He denies any worsening chest pain or shortness of breath. He continues to have some tightness in his legs. He had normal ABIs by Dopplers last year however will need a repeat of that. His renal Dopplers also failed to indicate any stenosis. He is not in atrial fibrillation today and is concerned about the cost of Savaysa. He is interested in switching NOACs. He also brought lab work from the [place] today which showed adequate thyroid levels, elevated uric acid at 8.1  and LDL of 27 with total cholesterol of 115. He is not on statins.  12/23/2016  [name] returns today for follow-up.  Over the past year he has done well.  He denies any worsening shortness of breath or chest pain.  He had lower extremity Dopplers and carotid Dopplers this past year which indicate no significant insufficiency or stenosis.  He denies any recurrent atrial fibrillation.  He switched from [facility] over to Eliquis.  He has not  had any recent lab work.  He tells me his last lipid profile which I reviewed from the [place] showed his total cholesterol of 115.  He has not been on statin therapy due to this.  Fortunately, he has not had any recurrent coronary disease.  He has had no progressive peripheral arterial disease as well.  12/29/2017  [name] is seen today in annual follow-up.  Overall he is doing well denies any chest pain or worsening shortness of breath.  Carotid Doppler showed no significant carotid stenosis.  He does complain of some neck pain which is likely arthritis.  He has been tolerating Eliquis without bleeding problems.  He has had improvement in swelling on low-dose thiazide.  Cholesterol recently well controlled with total 189, triglycerides 65, HDL 56 and LDL 40.  01/03/2019  [name] is seen today for routine follow-up.  He continues to do well.  Weight is back up about 6 pounds.  He had at one point been below 190 pounds.  He denies chest pain or worsening shortness of breath.  His blood pressure this morning was 130/70 but is elevated some today in the office.  He does report some issues with ongoing urinary frequency particularly at night.  He asked about changing around his medications however this is primarily managed by the [place].  He denies any palpitations.  EKG today shows he is maintaining sinus rhythm with first-degree AV block.  He had no bleeding problems on Eliquis which she takes regularly.  Recent labs showed LDL of 51.  12/27/2019  [name] returns today for follow-up.  He recently was diagnosed with a throat cancer.  He underwent partial resection and radiation therapy and seems to be doing well.  He said a PET scan indicated no evidence of any spread of the cancer.  He struggled with hoarseness of his voice.  He denies any chest pain or shortness of breath.  He says he was able to play golf throughout the summer without any issues.  EKG shows a sinus bradycardia first-degree AV block.  He  is not had any recurrent A. fib that he is aware of.  12/25/2020  [name] is seen today for an acute visit regarding left arm pain.  There is no associated chest pain however he had primarily left arm pain and weakness prior to his bypass surgery in 2006.  His symptoms were significant enough that he went ahead and called EMS.  An EKG was performed and a rhythm strip which captured frequent PVCs.  I personally reviewed that today.  His EKG in the office however shows no ectopy.  In fact there is a sinus rhythm with first-degree AV block and LVH by voltage at 67, unchanged from prior EKG.  Blood pressures well controlled today.  He said he eventually took nitroglycerin and his symptoms eased off however the prescription is quite old and barely made any burning under his tongue.  He denies any worsening symptoms with exertion or lifting.  His last stress test  was in 2016 which was negative for ischemia  04/05/2021  [name] returns today for follow-up.  Overall he seems to be doing well.  He denies any worsening chest pain or shortness of breath.  When I last saw him he was having left arm pain.  We did do a Myoview stress test which was negative for ischemia, back in December 2022.  Since then he has not done any worse.  Blood pressure is well controlled today.  EKG shows sinus rhythm.  PMHx:  Past Medical History:  Diagnosis Date   BPH (benign prostatic hyperplasia)    CAD (coronary artery disease) of artery bypass graft 2006   Fatty liver    GERD (gastroesophageal reflux disease)    Gilbert's syndrome    Hiatal hernia    History of nuclear stress test 11/2007   bruce myoview; normal pattern of persuion in all regions; post-stress EF 70%; low risk    HTN (hypertension)    Neuropathy    PAF (paroxysmal atrial fibrillation) (HCC)    PVD (peripheral vascular disease) (HCC)    0-49% R & L ICA stenosis (2013)    Renal artery aneurysm (HCC)    S/P CABG (coronary artery bypass graft) 2006    Schatzki's ring    non critical   Thrombocytopenia ([place])     Past Surgical History:  Procedure Laterality Date   CARDIOVERSION N/A 01/14/2018   Procedure: CARDIOVERSION;  Surgeon: [name], MD;  Location: [facility] ENDOSCOPY;  Service: Cardiovascular;  Laterality: N/A;   CATARACT EXTRACTION  2011   COLONOSCOPY  2006   Dr. Margarito Courser pancolonic diverticula   COLONOSCOPY  01/07/2012   Dr. Abbe Amsterdam normal rectum, pancolonic diverticulosis, hyperplastic polyp   COLONOSCOPY N/A 03/20/2015   QQI:[id number] diverticulosis   CORONARY ARTERY BYPASS GRAFT  04/2004   LIMA to diagonal, SVG to ramus intermedius, SVG to PDA; placed stent for crossed coronary arteries (Dr. Prescott Gum)   ESOPHAGOGASTRODUODENOSCOPY  2006   Dr. Ruthell Rummage Schatzki's ring, s/p 58-F Maloney dilation   ESOPHAGOGASTRODUODENOSCOPY  01/07/2012   Dr. Gareth Morgan ring, hiatal hernia, fundic gland polyp   ESOPHAGOGASTRODUODENOSCOPY N/A 03/20/2015   ERD:EYCXK 1 varices/HH Gastric polpy s/p bx   ESOPHAGOGASTRODUODENOSCOPY N/A 07/28/2018   Dr. Gala Romney: 3 short columns of grade 1 esophageal varices.  Esophageal dilation performed for dysphagia.  Multiple gastric polyps previously proved benign.   MALONEY DILATION N/A 07/28/2018   Procedure: Venia Minks DILATION;  Surgeon: Daneil Dolin, MD;  Location: [facility] ENDO SU[facility];  Service: Endoscopy;  Laterality: N/A;   rotater cuff     repair   TONSILLECTOMY      FAMHx:  Family History  Problem Relation Age of Onset   Heart attack Mother 91       deceased   Heart attack Father 32       deceased   Heart disease Brother        x2   Diabetes Brother        x3   Colon cancer Neg Hx     SOCHx:   reports that he quit smoking about 17 years ago. His smoking use included cigarettes. He has a 40.00 pack-year smoking history. He has never used smokeless tobacco. He reports current alcohol use. He reports that he does not use drugs.  ALLERGIES:  Allergies  Allergen  Reactions   Antihistamines, Chlorpheniramine-Type Rash    Patient states he cant take large doses of antihistamines also causes prostate problems    ROS: Pertinent items noted  in HPI and remainder of comprehensive ROS otherwise negative.  HOME MEDS: Current Outpatient Medications  Medication Sig Dispense Refill   albuterol (PROVENTIL HFA;VENTOLIN HFA) 108 (90 Base) MCG/ACT inhaler Inhale 1-2 puffs into the lungs every 6 (six) hours as needed for wheezing or shortness of breath.      allopurinol (ZYLOPRIM) 100 MG tablet Take 100 mg by mouth at bedtime.      Alpha-D-Galactosidase (BEANO ULTRA 800) TABS Take 1 tablet by mouth daily as needed (gas).     amLODipine (NORVASC) 5 MG tablet Take 5 mg by mouth daily.      apixaban (ELIQUIS) 5 MG TABS tablet Take 1 tablet (5 mg total) by mouth 2 (two) times daily. 180 tablet 3   cyclobenzaprine (FLEXERIL) 10 MG tablet Take 10 mg by mouth 3 (three) times daily as needed for muscle spasms.     doxazosin (CARDURA) 8 MG tablet Take 8 mg by mouth daily.     finasteride (PROSCAR) 5 MG tablet Take 5 mg by mouth at bedtime.      fluticasone (FLONASE) 50 MCG/ACT nasal spray Place 1 spray into both nostrils 2 (two) times a day.     gabapentin (NEURONTIN) 300 MG capsule Take 300 mg by mouth 3 (three) times daily.       hydrochlorothiazide (HYDRODIURIL) 25 MG tablet Take 1/2 (one-half) tablet by mouth once daily 45 tablet 1   ibuprofen (ADVIL) 200 MG tablet Take 200 mg by mouth every 6 (six) hours as needed.     Levothyroxine Sodium 112 MCG CAPS Take 112 mcg by mouth daily before breakfast.      losartan (COZAAR) 100 MG tablet Take 50 mg by mouth daily.     metoprolol tartrate (LOPRESSOR) 25 MG tablet Take 1 tablet by mouth twice daily 180 tablet 2   mirabegron ER (MYRBETRIQ) 25 MG TB24 tablet Take 25 mg by mouth daily.     mometasone (NASONEX) 50 MCG/ACT nasal spray Place 2 sprays into the nose daily.     nitroGLYCERIN (NITROSTAT) 0.4 MG SL tablet Place 1  tablet (0.4 mg total) under the tongue every 5 (five) minutes as needed for chest pain. Max 3 doses 25 tablet 3   RABEprazole (ACIPHEX) 20 MG tablet Take 1 tablet (20 mg total) by mouth 2 (two) times daily. 180 tablet 3   tamsulosin (FLOMAX) 0.4 MG CAPS capsule Take 0.4 mg by mouth at bedtime.      triamcinolone cream (KENALOG) 0.1 % Apply 1 application topically 2 (two) times daily as needed (FOR ITCHING/).     vitamin B-12 (CYANOCOBALAMIN) 500 MCG tablet Take 500 mcg by mouth daily.     No current facility-administered medications for this visit.    LABS/IMAGING: No results found for this or any previous visit (from the past 48 hour(s)). No results found.  VITALS: BP 132/61    Pulse (!) 59    Ht 5' 8"  (1.727 m)    Wt 185 lb 3.2 oz (84 kg)    SpO2 98%    BMI 28.16 kg/m   EXAM: General appearance: alert and no distress Neck: no carotid bruit and no JVD Lungs: clear to auscultation bilaterally Heart: regular rate and rhythm, S1, S2 normal, no murmur, click, rub or gallop Abdomen: soft, non-tender; bowel sounds normal; no masses,  no organomegaly Extremities: extremities normal, atraumatic, no cyanosis or edema Pulses: 2+ and symmetric Skin: Skin color, texture, turgor normal. 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   Mr. Hellmer seems to be doing well without any  recurrent arm pain.  He had a low risk Myoview in December.  Blood pressure is well controlled.  He denies any worsening shortness of breath.  He only had 1 PVC today but had more frequent PVCs previously.  He denies any recurrent A-fib.  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  No changes today to his meds.  Plan follow-up annually or sooner as necessary.  Pixie Casino, MD, [facility], [place] Director of the Advanced Lipid Disorders &  Cardiovascular Risk Reduction Clinic Attending Cardiologist  Direct Dial: [phone]   Fax: [phone]  Website:  [email or web address] 04/05/2021, 10:54 AM

## 2021-04-15 DIAGNOSIS — H3561 Retinal hemorrhage, right eye: Secondary | ICD-10-CM | POA: Diagnosis not present

## 2021-04-15 DIAGNOSIS — H40013 Open angle with borderline findings, low risk, bilateral: Secondary | ICD-10-CM | POA: Diagnosis not present

## 2021-04-15 DIAGNOSIS — H353132 Nonexudative age-related macular degeneration, bilateral, intermediate dry stage: Secondary | ICD-10-CM | POA: Diagnosis not present

## 2021-04-15 DIAGNOSIS — H26493 Other secondary cataract, bilateral: Secondary | ICD-10-CM | POA: Diagnosis not present

## 2021-05-08 DIAGNOSIS — E782 Mixed hyperlipidemia: Secondary | ICD-10-CM | POA: Diagnosis not present

## 2021-05-08 DIAGNOSIS — M2241 Chondromalacia patellae, right knee: Secondary | ICD-10-CM | POA: Diagnosis not present

## 2021-05-08 DIAGNOSIS — M1711 Unilateral primary osteoarthritis, right knee: Secondary | ICD-10-CM | POA: Diagnosis not present

## 2021-05-08 DIAGNOSIS — Z0001 Encounter for general adult medical examination with abnormal findings: Secondary | ICD-10-CM | POA: Diagnosis not present

## 2021-06-24 ENCOUNTER — Telehealth: Payer: Self-pay | Admitting: Internal Medicine

## 2021-06-24 NOTE — Telephone Encounter (Signed)
Routed to MD and pharmD to review.

## 2021-06-24 NOTE — Telephone Encounter (Signed)
Pt c/o medication issue:  1. Name of Medication: Beet Mix  2. How are you currently taking this medication (dosage and times per day)? Not currently taking   3. Are you having a reaction (difficulty breathing--STAT)? No   4. What is your medication issue? Patient's wife is calling stating the patient saw a beet mix on TV that he is interested in taking. They are wanting to confirm Dr. Debara Pickett would be okay with him taking this. Please advise.

## 2021-06-24 NOTE — Telephone Encounter (Signed)
Left message (ok per DPR) to make aware.

## 2021-06-24 NOTE — Telephone Encounter (Signed)
Ok to take this supplement.  Dr Lemmie Evens

## 2021-10-10 ENCOUNTER — Telehealth: Payer: Self-pay | Admitting: Internal Medicine

## 2021-10-10 NOTE — Telephone Encounter (Signed)
Patient stated his heart rates has been running in the 115-120 range at times. He denies dizziness, chest pain, or sob with it, but gets weak. He still plays golf. He took two metoprolol tartrate 63m tablets this AM . Current BP 121/50, P 71. He stated he will double up his dose as needed. Please advise on medication.

## 2021-10-10 NOTE — Telephone Encounter (Signed)
STAT if HR is under 50 or over 120 (normal HR is 60-100 beats per minute)  What is your heart rate?  HR 110 after taking medication  Do you have a log of your heart rate readings (document readings)?  No  Do you have any other symptoms?   Left eye hurts  Patient stated that the last couple of days his heart has been running about 115.  Patient stated he has an episode of lightheadedness.

## 2021-10-14 NOTE — Telephone Encounter (Signed)
Informed wife I will send current BP to Dr. Debara Pickett. 132/54, P 62

## 2021-10-14 NOTE — Telephone Encounter (Signed)
  Pt called back and gave his recent BP 132/54 HR 62

## 2021-10-14 NOTE — Telephone Encounter (Signed)
Has to be careful about doubling the Toprol - the diastolic BP is low at 50, may be causing dizziness and could be worse with higher doses of BP medication.  Dr Lemmie Evens

## 2021-10-14 NOTE — Telephone Encounter (Addendum)
Patient informed of reply from Dr. Debara Pickett.Marland KitchenMarland KitchenHas to be careful about doubling the Toprol - the diastolic BP is low at 50, may be causing dizziness and could be worse with higher doses of BP medication.  Patient stated he is now taking his lopressor (not toprol. I corrected this note from 9/7) 25 mg in the morning and 25 mg at night. He is splitting his amlodipine to 2.5 mg in the morning and 2.5 mg at night. I explained that he is to take the entire pill once a day. He verbalized understanding. While on phone, BP 160/58, P 69.

## 2021-10-17 NOTE — Telephone Encounter (Signed)
Patient advised to stop taking hydrochlorothiazide. He will continue to keep a diary of blood pressure and contact our clinic with any concerns. Medication removed from list.

## 2021-10-17 NOTE — Addendum Note (Signed)
Addended by: Betha Loa F on: 10/17/2021 08:07 AM   Modules accepted: Orders

## 2021-11-07 ENCOUNTER — Telehealth: Payer: Self-pay

## 2021-11-07 NOTE — Telephone Encounter (Signed)
Received message from vascular scheduler who states that patient called in with concerns with ankle edema after stopping his HCTZ due to dizziness and low diastolic BP's.   Patient states that he has been around a week without the HCTZ. Patient states that his ankles have started to swell again and reports that his BP has crept back up to 138/60. Patient states that other than that he feels well- denies weight gain, shortness of breath, or any other symptoms. Patient states that he also did not have any dizziness while on HCTZ just reports slightly lower diastolic blood pressures with HCTZ but patient does state that there has not been a drastic change in the diastolic's while being off the HCTZ. Patient would like to restart his previous dose. Advised patient I would forward to Dr. Debara Pickett for him to review and advise. Patient verbalized understanding.

## 2021-11-08 MED ORDER — HYDROCHLOROTHIAZIDE 12.5 MG PO CAPS: 12.5000 mg | ORAL_CAPSULE | Freq: Every day | ORAL | 3 refills | Status: DC

## 2021-11-08 NOTE — Addendum Note (Signed)
Addended by: Betha Loa F on: 11/08/2021 03:28 PM   Modules accepted: Orders

## 2021-11-08 NOTE — Telephone Encounter (Signed)
Patient informed to take hctz 12.50m daily. He will complete the tablets he has on hand (taking 1/2 tablet). Order placed for hctz 12.536mcapsule daily.

## 2021-11-08 NOTE — Telephone Encounter (Signed)
Call cannot be completed at this time. Unable to leave a message.

## 2021-11-18 ENCOUNTER — Telehealth: Payer: Self-pay | Admitting: Internal Medicine

## 2021-11-18 MED ORDER — METOPROLOL TARTRATE 25 MG PO TABS: 25.0000 mg | ORAL_TABLET | Freq: Two times a day (BID) | ORAL | 3 refills | Status: DC

## 2021-11-18 NOTE — Telephone Encounter (Signed)
Pt c/o medication issue:  1. Name of Medication:   hydrochlorothiazide (MICROZIDE) 12.5 MG capsule    2. How are you currently taking this medication (dosage and times per day)? Take 1 capsule (12.5 mg total) by mouth daily  3. Are you having a reaction (difficulty breathing--STAT)? NO  4. What is your medication issue? Retuning nurses call as requested regarding above medication. Please advise

## 2021-11-18 NOTE — Telephone Encounter (Signed)
Spoke to patient he stated he continues to have swelling in both ankles.Appointment offered this week but he has other appointments and cannot come.Appointment scheduled with Almyra Deforest PA 10/23 at 1:55 pm.He also requested Metoprolol refill.Refill sent to [facility].

## 2021-11-25 ENCOUNTER — Encounter: Payer: Self-pay | Admitting: Physician Assistant

## 2021-11-25 ENCOUNTER — Ambulatory Visit: Payer: Medicare Other | Attending: Physician Assistant | Admitting: Physician Assistant

## 2021-11-25 VITALS — BP 130/54 | HR 71 | Ht 68.0 in | Wt 194.0 lb

## 2021-11-25 DIAGNOSIS — R600 Localized edema: Secondary | ICD-10-CM

## 2021-11-25 DIAGNOSIS — E785 Hyperlipidemia, unspecified: Secondary | ICD-10-CM

## 2021-11-25 DIAGNOSIS — I4819 Other persistent atrial fibrillation: Secondary | ICD-10-CM

## 2021-11-25 DIAGNOSIS — I25810 Atherosclerosis of coronary artery bypass graft(s) without angina pectoris: Secondary | ICD-10-CM

## 2021-11-25 DIAGNOSIS — I10 Essential (primary) hypertension: Secondary | ICD-10-CM

## 2021-11-25 MED ORDER — FUROSEMIDE 20 MG PO TABS: 20.0000 mg | ORAL_TABLET | Freq: Every day | ORAL | 3 refills | Status: DC

## 2021-11-25 NOTE — Patient Instructions (Signed)
Medication Instructions:  STOP Hydrochlorothiazide  START Lasix 20 mg daily  *If you need a refill on your cardiac medications before your next appointment, please call your pharmacy*  Lab Work: Your physician recommends that you return for lab work TODAY:  BMP BNP CBC  Your physician recommends that you return for lab work in 1 week:  BMP  If you have labs (blood work) drawn today and your tests are completely normal, you will receive your results only by: [place] (if you have [place]) OR A paper copy in the mail If you have any lab test that is abnormal or we need to change your treatment, we will call you to review the results.  Testing/Procedures: Your physician has requested that you have an echocardiogram. Echocardiography is a painless test that uses sound waves to create images of your heart. It provides your doctor with information about the size and shape of your heart and how well your heart's chambers and valves are working. This procedure takes approximately one hour. There are no restrictions for this procedure. Please do NOT wear cologne, perfume, aftershave, or lotions (deodorant is allowed). Please arrive 15 minutes prior to your appointment time.  Please schedule for prior to follow up with Almyra Deforest, PA-C  Follow-Up: At [facility], you and your health needs are our priority.  As part of our continuing mission to provide you with exceptional heart care, we have created designated Provider Care Teams.  These Care Teams include your primary Cardiologist (physician) and Advanced Practice Providers (APPs -  Physician Assistants and Nurse Practitioners) who all work together to provide you with the care you need, when you need it.  We recommend signing up for the patient portal called "MyChart".  Sign up information is provided on this After Visit Summary.  MyChart is used to connect with patients for Virtual Visits (Telemedicine).  Patients are able to  view lab/test results, encounter notes, upcoming appointments, etc.  Non-urgent messages can be sent to your provider as well.   To learn more about what you can do with MyChart, go to [email or web address].    Your next appointment:   3-4 week(s)  The format for your next appointment:   In Person  Provider:   Almyra Deforest, PA-C on a day Dr. Debara Pickett is also in the office         Other Instructions  Important Information About Sugar

## 2021-11-25 NOTE — Progress Notes (Unsigned)
Cardiology Office Note:    Date:  11/27/2021   ID:  [name], DOB 1933/12/18, MRN [id number]  PCP:  Redmond School, [place] [facility] Cardiologist:  Pixie Casino, MD     Referring MD: [facility] [facility], MD   Chief Complaint  Patient presents with   Follow-up   Edema    Ankles.    History of Present Illness:    [name] is a 88 y.o. male with a hx of CAD s/p CABG 2006, fatty liver, hypertension, hyperlipidemia, PAD, NASH cirrhosis and PAF.  He has a history of mild carotid artery disease.  Last Myoview was obtained in April 2016 which showed EF 58%, normal stress nuclear study.  Last echocardiogram obtained on 01/13/2018 showed EF 55 to 60%, moderate LVH, PA peak pressure 36 mmHg.  He was on Coumadin in the past for A. fib, this was later switched to Xarelto.  This was later switched to [facility] and eventually Eliquis 5 mg twice daily.  His last cardioversion was in December 2019.  I last saw the patient in May 2021 at which time he was seen in GI service for his NASH cirrhosis with stage I esophageal varices.  He was diagnosed with throat cancer in 2021 and underwent partial resection and radiation therapy.  PET scan showed no evidence of metastasis.  Echocardiogram obtained on 02/08/2020 showed EF 60 to 65%, grade 2 DD, RVSP 39.2 mmHg, trivial MR, mild aortic stenosis.  Repeat Myoview in December 2022 was negative for ischemia, EF 67%.  Patient was last seen by Dr. Debara Pickett in March 2023 at which time he was doing well.  Patient presents today for follow-up and evaluation for leg edema.  Leg edema has been going on for 2 to 3 weeks.  His hydrochlorothiazide was initially taken off in September due to low diastolic pressure and the dizziness, however restarted in early October due to leg edema.  Since he restarted on the low-dose hydrochlorothiazide, leg edema has not gone down.  On physical exam, he has bilateral pedal edema and the leg edema all the way up to  the knee.  He is lung is clear.  Abdomen soft.  No JVD on exam.  EKG however shows the patient is back in atrial fibrillation.  Unfortunately he does not have good cardiac awareness of A-fib, therefore we do not know how long he has been in A-fib.  Given lack of symptom, we will focus on rate control at this time.  It is possible that A-fib is contributing to his leg edema.  I will obtain echocardiogram.  I will discontinue his hydrochlorothiazide and the switch to Lasix 20 mg daily.  I will obtain basic metabolic panel, CBC and a BMP today and repeat of a basic metabolic panel again 1 week after starting on the Lasix.  I recommend echocardiogram and follow-up in 3 to 4 weeks.   Past Medical History:  Diagnosis Date   BPH (benign prostatic hyperplasia)    CAD (coronary artery disease) of artery bypass graft 2006   Fatty liver    GERD (gastroesophageal reflux disease)    Gilbert's syndrome    Hiatal hernia    History of nuclear stress test 11/2007   bruce myoview; normal pattern of persuion in all regions; post-stress EF 70%; low risk    HTN (hypertension)    Neuropathy    PAF (paroxysmal atrial fibrillation) ([place])    PVD (peripheral vascular disease) ([place])  0-49% R & L ICA stenosis (2013)    Renal artery aneurysm (HCC)    S/P CABG (coronary artery bypass graft) 2006   Schatzki's ring    non critical   Thrombocytopenia [facility])     Past Surgical History:  Procedure Laterality Date   CARDIOVERSION N/A 01/14/2018   Procedure: CARDIOVERSION;  Surgeon: [name], MD;  Location: [facility] ENDOSCOPY;  Service: Cardiovascular;  Laterality: N/A;   CATARACT EXTRACTION  2011   COLONOSCOPY  2006   Dr. Margarito Courser pancolonic diverticula   COLONOSCOPY  01/07/2012   Dr. Abbe Amsterdam normal rectum, pancolonic diverticulosis, hyperplastic polyp   COLONOSCOPY N/A 03/20/2015   [id number] diverticulosis   CORONARY ARTERY BYPASS GRAFT  04/2004   LIMA to diagonal, SVG to ramus intermedius, SVG  to PDA; placed stent for crossed coronary arteries (Dr. Prescott Gum)   ESOPHAGOGASTRODUODENOSCOPY  2006   Dr. Ruthell Rummage Schatzki's ring, s/p 58-F Maloney dilation   ESOPHAGOGASTRODUODENOSCOPY  01/07/2012   Dr. Gareth Morgan ring, hiatal hernia, fundic gland polyp   ESOPHAGOGASTRODUODENOSCOPY N/A 03/20/2015   WSF:KCLEX 1 varices/HH Gastric polpy s/p bx   ESOPHAGOGASTRODUODENOSCOPY N/A 07/28/2018   Dr. Gala Romney: 3 short columns of grade 1 esophageal varices.  Esophageal dilation performed for dysphagia.  Multiple gastric polyps previously proved benign.   MALONEY DILATION N/A 07/28/2018   Procedure: Venia Minks DILATION;  Surgeon: Daneil Dolin, MD;  Location: [facility] ENDO SUITE;  Service: Endoscopy;  Laterality: N/A;   rotater cuff     repair   TONSILLECTOMY      Current Medications: Current Meds  Medication Sig   albuterol (PROVENTIL HFA;VENTOLIN HFA) 108 (90 Base) MCG/ACT inhaler Inhale 1-2 puffs into the lungs every 6 (six) hours as needed for wheezing or shortness of breath.    allopurinol (ZYLOPRIM) 100 MG tablet Take 100 mg by mouth at bedtime.    Alpha-D-Galactosidase (BEANO ULTRA 800) TABS Take 1 tablet by mouth daily as needed (gas).   amLODipine (NORVASC) 5 MG tablet Take 5 mg by mouth daily.    apixaban (ELIQUIS) 5 MG TABS tablet Take 1 tablet (5 mg total) by mouth 2 (two) times daily.   cyclobenzaprine (FLEXERIL) 10 MG tablet Take 10 mg by mouth 3 (three) times daily as needed for muscle spasms.   finasteride (PROSCAR) 5 MG tablet Take 5 mg by mouth at bedtime.    fluticasone (FLONASE) 50 MCG/ACT nasal spray Place 1 spray into both nostrils 2 (two) times a day.   furosemide (LASIX) 20 MG tablet Take 1 tablet (20 mg total) by mouth daily.   gabapentin (NEURONTIN) 300 MG capsule Take 300 mg by mouth 3 (three) times daily.     ibuprofen (ADVIL) 200 MG tablet Take 200 mg by mouth every 6 (six) hours as needed.   Levothyroxine Sodium 112 MCG CAPS Take 112 mcg by mouth daily before  breakfast.    losartan (COZAAR) 100 MG tablet Take 50 mg by mouth daily.   metoprolol tartrate (LOPRESSOR) 25 MG tablet Take 1 tablet (25 mg total) by mouth 2 (two) times daily.   mometasone (NASONEX) 50 MCG/ACT nasal spray Place 2 sprays into the nose daily.   nitroGLYCERIN (NITROSTAT) 0.4 MG SL tablet Place 1 tablet (0.4 mg total) under the tongue every 5 (five) minutes as needed for chest pain. Max 3 doses   RABEprazole (ACIPHEX) 20 MG tablet Take 1 tablet (20 mg total) by mouth 2 (two) times daily. (Patient taking differently: Take 20 mg by mouth daily.)   tamsulosin (FLOMAX) 0.4 MG  CAPS capsule Take 0.8 mg by mouth at bedtime.   triamcinolone cream (KENALOG) 0.1 % Apply 1 application topically 2 (two) times daily as needed (FOR ITCHING/).   vitamin B-12 (CYANOCOBALAMIN) 500 MCG tablet Take 500 mcg by mouth daily.   [DISCONTINUED] doxazosin (CARDURA) 8 MG tablet Take 8 mg by mouth daily.   [DISCONTINUED] hydrochlorothiazide (MICROZIDE) 12.5 MG capsule Take 1 capsule (12.5 mg total) by mouth daily.   [DISCONTINUED] mirabegron ER (MYRBETRIQ) 25 MG TB24 tablet Take 25 mg by mouth daily.     Allergies:   Antihistamines, chlorpheniramine-type   Social History   Socioeconomic History   Marital status: Married    Spouse name: Not on file   Number of children: Not on file   Years of education: Not on file   Highest education level: Not on file  Occupational History   Not on file  Tobacco Use   Smoking status: Former    Packs/day: 1.00    Years: 40.00    Total pack years: 40.00    Types: Cigarettes    Quit date: 04/07/2004    Years since quitting: 17.6   Smokeless tobacco: Never  Substance and Sexual Activity   Alcohol use: Yes    Comment: occassional drinker   Drug use: No   Sexual activity: Never  Other Topics Concern   Not on file  Social History Narrative   Not on file   Social Determinants of Health   Financial Resource Strain: Not on file  Food Insecurity: Not on file   Transportation Needs: Not on file  Physical Activity: Not on file  Stress: Not on file  Social Connections: Not on file     Family History: The patient's family history includes Diabetes in his brother; Heart attack (age of onset: 45) in his mother; Heart attack (age of onset: 31) in his father; Heart disease in his brother. There is no history of Colon cancer.  ROS:   Please see the history of present illness.     All other systems reviewed and are negative.  EKGs/Labs/Other Studies Reviewed:    The following studies were reviewed today:  Echo 02/08/2020  1. Left ventricular ejection fraction, by estimation, is 60 to 65%. The  left ventricle has normal function. The left ventricle has no regional  wall motion abnormalities. There is mild left ventricular hypertrophy.  Left ventricular diastolic parameters  are consistent with Grade II diastolic dysfunction (pseudonormalization).   2. Right ventricular systolic function is normal. The right ventricular  size is normal. There is mildly elevated pulmonary artery systolic  pressure. The estimated right ventricular systolic pressure is 25.6 mmHg.   3. Left atrial size was moderately dilated.   4. The mitral valve is grossly normal. Trivial mitral valve  regurgitation. Moderate mitral annular calcification.   5. The aortic valve is tricuspid. There is moderate calcification of the  aortic valve. Aortic valve regurgitation is not visualized. Mild aortic  valve stenosis. Aortic valve area, by VTI measures 1.43 cm. Aortic valve  mean gradient measures 10.0 mmHg.  Dimentionless index 0.56.   6. The inferior vena cava is normal in size with greater than 50%  respiratory variability, suggesting right atrial pressure of 3 mmHg.   EKG:  EKG is ordered today.  The ekg ordered today demonstrates atrial fibrillation, heart rate 71 bpm.  Recent Labs: 11/25/2021: BNP 237.3; BUN 13; Creatinine, Ser 1.20; Hemoglobin 10.3; Platelets 173;  Potassium 4.1; Sodium 137  Recent Lipid Panel  Component Value Date/Time   CHOL 117 05/10/2014 0955   TRIG 60 05/10/2014 0955   HDL 55 05/10/2014 0955   CHOLHDL 2.4 11/07/2007 0540   VLDL 8 11/07/2007 0540   LDLCALC 50 05/10/2014 0955     Risk Assessment/Calculations:    CHA2DS2-VASc Score = 4   This indicates a 4.8% annual risk of stroke. The patient's score is based upon: CHF History: 0 HTN History: 1 Diabetes History: 0 Stroke History: 0 Vascular Disease History: 1 Age Score: 2 Gender Score: 0          Physical Exam:    VS:  BP (!) 130/54 (BP Location: Right Arm, Patient Position: Sitting, Cuff Size: Normal)   Pulse 71   Ht 5' 8"  (1.727 m)   Wt 194 lb (88 kg)   BMI 29.50 kg/m        Wt Readings from Last 3 Encounters:  11/25/21 194 lb (88 kg)  04/05/21 185 lb 3.2 oz (84 kg)  01/03/21 196 lb (88.9 kg)     GEN:  Well nourished, well developed in no acute distress HEENT: Normal NECK: No JVD; No carotid bruits LYMPHATICS: No lymphadenopathy CARDIAC: RRR, no murmurs, rubs, gallops RESPIRATORY:  Clear to auscultation without rales, wheezing or rhonchi  ABDOMEN: Soft, non-tender, non-distended MUSCULOSKELETAL:  No edema; No deformity  SKIN: Warm and dry NEUROLOGIC:  Alert and oriented x 3 PSYCHIATRIC:  Normal affect   ASSESSMENT:    1. Leg edema   2. Persistent atrial fibrillation ([place])   3. Coronary artery disease involving coronary bypass graft of native heart without angina pectoris   4. Essential hypertension   5. Hyperlipidemia LDL goal <70    PLAN:    In order of problems listed above:  Leg edema: Symptom has been going on for 2 to 3 weeks.  We will discontinue hydrochlorothiazide and switch to Lasix 20 mg daily.  Patient will need CBC, basic metabolic panel and a BMP today and a repeat basic metabolic panel in 1 week.  We will obtain echocardiogram as well.  Unclear if recurrent A-fib is contributing to the leg edema.  Persistent atrial  fibrillation: Patient was noted to be back in atrial fibrillation based on today's EKG.  He is largely asymptomatic, we will focus on rate control for now.  Obtain echocardiogram to assess EF and the left atrial size.  If the left atrial size is not significantly enlarged, he may be a candidate for cardioversion  CAD: Denies any recent chest pain  Hypertension: Blood pressure stable  Hyperlipidemia: Patient is not on any statin medication.  We will need to rediscuss cholesterol control on follow-up           Medication Adjustments/Labs and Tests Ordered: Current medicines are reviewed at length with the patient today.  Concerns regarding medicines are outlined above.  Orders Placed This Encounter  Procedures   Brain natriuretic peptide   Basic metabolic panel   CBC   Basic metabolic panel   EKG 29-FAOZ   ECHOCARDIOGRAM COMPLETE   Meds ordered this encounter  Medications   furosemide (LASIX) 20 MG tablet    Sig: Take 1 tablet (20 mg total) by mouth daily.    Dispense:  90 tablet    Refill:  3    Patient Instructions  Medication Instructions:  STOP Hydrochlorothiazide  START Lasix 20 mg daily  *If you need a refill on your cardiac medications before your next appointment, please call your pharmacy*  Lab Work: Your physician  recommends that you return for lab work TODAY:  BMP BNP CBC  Your physician recommends that you return for lab work in 1 week:  BMP  If you have labs (blood work) drawn today and your tests are completely normal, you will receive your results only by: [place] (if you have MyChart) OR A paper copy in the mail If you have any lab test that is abnormal or we need to change your treatment, we will call you to review the results.  Testing/Procedures: Your physician has requested that you have an echocardiogram. Echocardiography is a painless test that uses sound waves to create images of your heart. It provides your doctor with information  about the size and shape of your heart and how well your heart's chambers and valves are working. This procedure takes approximately one hour. There are no restrictions for this procedure. Please do NOT wear cologne, perfume, aftershave, or lotions (deodorant is allowed). Please arrive 15 minutes prior to your appointment time.  Please schedule for prior to follow up with Almyra Deforest, PA-C  Follow-Up: At [facility], you and your health needs are our priority.  As part of our continuing mission to provide you with exceptional heart care, we have created designated Provider Care Teams.  These Care Teams include your primary Cardiologist (physician) and Advanced Practice Providers (APPs -  Physician Assistants and Nurse Practitioners) who all work together to provide you with the care you need, when you need it.  We recommend signing up for the patient portal called "MyChart".  Sign up information is provided on this After Visit Summary.  MyChart is used to connect with patients for Virtual Visits (Telemedicine).  Patients are able to view lab/test results, encounter notes, upcoming appointments, etc.  Non-urgent messages can be sent to your provider as well.   To learn more about what you can do with MyChart, go to [email or web address].    Your next appointment:   3-4 week(s)  The format for your next appointment:   In Person  Provider:   Almyra Deforest, PA-C on a day Dr. Debara Pickett is also in the office         Other Instructions  Important Information About Sugar         Hilbert Corrigan, [place]  11/27/2021 11:33 PM    [place]

## 2021-11-27 LAB — CBC
Hematocrit: 29.3 % — ABNORMAL LOW (ref 37.5–51.0)
Hemoglobin: 10.3 g/dL — ABNORMAL LOW (ref 13.0–17.7)
MCH: 33.3 pg — ABNORMAL HIGH (ref 26.6–33.0)
MCHC: 35.2 g/dL (ref 31.5–35.7)
MCV: 95 fL (ref 79–97)
Platelets: 173 10*3/uL (ref 150–450)
RBC: 3.09 x10E6/uL — ABNORMAL LOW (ref 4.14–5.80)
RDW: 11.7 % (ref 11.6–15.4)
WBC: 5.4 10*3/uL (ref 3.4–10.8)

## 2021-11-27 LAB — BASIC METABOLIC PANEL
BUN/Creatinine Ratio: 11 (ref 10–24)
BUN: 13 mg/dL (ref 8–27)
CO2: 21 mmol/L (ref 20–29)
Calcium: 9.2 mg/dL (ref 8.6–10.2)
Chloride: 101 mmol/L (ref 96–106)
Creatinine, Ser: 1.2 mg/dL (ref 0.76–1.27)
Glucose: 94 mg/dL (ref 70–99)
Potassium: 4.1 mmol/L (ref 3.5–5.2)
Sodium: 137 mmol/L (ref 134–144)
eGFR: 59 mL/min/{1.73_m2} — ABNORMAL LOW (ref >59)

## 2021-11-27 LAB — BRAIN NATRIURETIC PEPTIDE: BNP: 237.3 pg/mL — ABNORMAL HIGH (ref 0.0–100.0)

## 2021-11-28 ENCOUNTER — Other Ambulatory Visit: Payer: Self-pay

## 2021-11-28 DIAGNOSIS — R600 Localized edema: Secondary | ICD-10-CM

## 2021-11-29 ENCOUNTER — Telehealth: Payer: Self-pay | Admitting: Physician Assistant

## 2021-11-29 NOTE — Telephone Encounter (Signed)
Pt states he is returning a call from [place], [place]. Informed pt that notes say they spoke yesterday about results but he says it wasn't him, it was him wife. Please advise.

## 2021-11-29 NOTE — Telephone Encounter (Signed)
Returned call to patient he stated that his wife gave him the results. He stated that he wanted to let Isaac Laud know that he is not using the bathroom no more no less than he was before starting the lasix. When asked to explain he stated that he is using the bathroom maybe 2 times a day which is how it was before adding the medication it may be a couple of hours between bathroom usage.

## 2021-11-29 NOTE — Telephone Encounter (Signed)
I have called and spoke with both the patient and his wife, I have instructed the patient to increase Lasix to 40 mg daily.  I will call the patient Monday afternoon to reassess and make sure he is responding to the higher dose of the diuretic.

## 2021-12-03 DIAGNOSIS — R600 Localized edema: Secondary | ICD-10-CM | POA: Diagnosis not present

## 2021-12-03 LAB — BASIC METABOLIC PANEL WITH GFR
BUN/Creatinine Ratio: 11 (ref 10–24)
BUN: 14 mg/dL (ref 8–27)
CO2: 25 mmol/L (ref 20–29)
Calcium: 9.2 mg/dL (ref 8.6–10.2)
Chloride: 99 mmol/L (ref 96–106)
Creatinine, Ser: 1.25 mg/dL (ref 0.76–1.27)
Glucose: 104 mg/dL — ABNORMAL HIGH (ref 70–99)
Potassium: 3.9 mmol/L (ref 3.5–5.2)
Sodium: 136 mmol/L (ref 134–144)
eGFR: 56 mL/min/1.73 — ABNORMAL LOW (ref >59)

## 2021-12-16 ENCOUNTER — Ambulatory Visit (INDEPENDENT_AMBULATORY_CARE_PROVIDER_SITE_OTHER): Payer: Medicare Other

## 2021-12-16 DIAGNOSIS — R600 Localized edema: Secondary | ICD-10-CM

## 2021-12-16 LAB — ECHOCARDIOGRAM COMPLETE
AR max vel: 1.16 cm2
AV Area VTI: 1.29 cm2
AV Area mean vel: 1.17 cm2
AV Mean grad: 10 mmHg
AV Peak grad: 18 mmHg
Ao pk vel: 2.12 m/s
Area-P 1/2: 2.8 cm2
S' Lateral: 3.51 cm

## 2021-12-24 ENCOUNTER — Ambulatory Visit: Payer: Medicare Other | Attending: Physician Assistant | Admitting: Physician Assistant

## 2021-12-24 ENCOUNTER — Encounter: Payer: Self-pay | Admitting: Physician Assistant

## 2021-12-24 VITALS — BP 138/52 | HR 64 | Ht 68.0 in | Wt 188.8 lb

## 2021-12-24 DIAGNOSIS — I480 Paroxysmal atrial fibrillation: Secondary | ICD-10-CM | POA: Diagnosis not present

## 2021-12-24 DIAGNOSIS — R600 Localized edema: Secondary | ICD-10-CM

## 2021-12-24 DIAGNOSIS — I10 Essential (primary) hypertension: Secondary | ICD-10-CM | POA: Diagnosis not present

## 2021-12-24 DIAGNOSIS — K7460 Unspecified cirrhosis of liver: Secondary | ICD-10-CM

## 2021-12-24 DIAGNOSIS — I25810 Atherosclerosis of coronary artery bypass graft(s) without angina pectoris: Secondary | ICD-10-CM

## 2021-12-24 DIAGNOSIS — I2583 Coronary atherosclerosis due to lipid rich plaque: Secondary | ICD-10-CM | POA: Diagnosis not present

## 2021-12-24 DIAGNOSIS — I2510 Atherosclerotic heart disease of native coronary artery without angina pectoris: Secondary | ICD-10-CM | POA: Diagnosis not present

## 2021-12-24 NOTE — Patient Instructions (Signed)
Medication Instructions:  No Changes *If you need a refill on your cardiac medications before your next appointment, please call your pharmacy*   Lab Work: BMET Today If you have labs (blood work) drawn today and your tests are completely normal, you will receive your results only by: [place] (if you have MyChart) OR A paper copy in the mail If you have any lab test that is abnormal or we need to change your treatment, we will call you to review the results.   Testing/Procedures: No Testing   Follow-Up: At [facility], you and your health needs are our priority.  As part of our continuing mission to provide you with exceptional heart care, we have created designated Provider Care Teams.  These Care Teams include your primary Cardiologist (physician) and Advanced Practice Providers (APPs -  Physician Assistants and Nurse Practitioners) who all work together to provide you with the care you need, when you need it.  We recommend signing up for the patient portal called "MyChart".  Sign up information is provided on this After Visit Summary.  MyChart is used to connect with patients for Virtual Visits (Telemedicine).  Patients are able to view lab/test results, encounter notes, upcoming appointments, etc.  Non-urgent messages can be sent to your provider as well.   To learn more about what you can do with MyChart, go to [email or web address].    Your next appointment:   4-6 month(s)  The format for your next appointment:   In Person  Provider:   Pixie Casino, MD

## 2021-12-24 NOTE — Progress Notes (Unsigned)
Cardiology Office Note:    Date:  12/26/2021   ID:  [name], DOB 1933/07/11, MRN [id number]  PCP:  [facility], [place] [facility] Cardiologist:  Pixie Casino, MD     Referring MD: [facility], MD   Chief Complaint  Patient presents with   Follow-up    Seen for Dr. Debara Pickett    History of Present Illness:    [name] is a 88 y.o. male with a hx of CAD s/p CABG 2006, fatty liver, hypertension, hyperlipidemia, PAD, NASH cirrhosis and PAF.  He has a history of mild carotid artery disease.  Last Myoview was obtained in April 2016 which showed EF 58%, normal stress nuclear study.  Last echocardiogram obtained on 01/13/2018 showed EF 55 to 60%, moderate LVH, PA peak pressure 36 mmHg.  He was on Coumadin in the past for A. fib, this was later switched to Xarelto.  This was later switched to [facility] and eventually Eliquis 5 mg twice daily.  His last cardioversion was in December 2019.  I last saw the patient in May 2021 at which time he was seen in GI service for his NASH cirrhosis with stage I esophageal varices.  He was diagnosed with throat cancer in 2021 and underwent partial resection and radiation therapy.  PET scan showed no evidence of metastasis.  Echocardiogram obtained on 02/08/2020 showed EF 60 to 65%, grade 2 DD, RVSP 39.2 mmHg, trivial MR, mild aortic stenosis.  Repeat Myoview in December 2022 was negative for ischemia, EF 67%.  Patient was seen by Dr. Debara Pickett in March 2023 at which time he was doing well.    I last saw the patient on 11/25/2021 for evaluation of neck edema that has been going on for 2 to 3 weeks.  His HCTZ was initially taken off in September due to low diastolic pressure and dizziness, however restarted in early October due to leg edema.  However given after resuming the hydrochlorothiazide, his leg edema has not gone down.  His EKG also shows he went back into atrial fibrillation, given the fact he does not have any cardiac  awareness of A-fib, therefore we do not know how long he has been in A-fib.  Given lack of symptom, we decided to focus on rate control.  I discontinued his hydrochlorothiazide and switch to Lasix 20 mg daily.  BNP was elevated at 237.3.  Blood work shows stable renal function and electrolytes.  Hemoglobin was 10.3.  Echocardiogram obtained on 12/16/2021 showed EF 50 to 55%, no regional wall motion abnormality, moderate LVH of basal septal segment, RV systolic function was normal, RVSP 41.6 mmHg, mild MR, mild to moderate AI.  The pumping function of the heart has decreased from the previous 60 to 65% in 2022 down to 50 to 55%.  Due to persistent leg swelling, eventually increase his Lasix to 40 mg daily.  He presents today for follow-up along with his wife.  His leg edema has significantly improved.  Weight is down as well.  He has been coughing for several weeks, however I do not think this is related to fluid.  He may have bronchitis.  I recommended repeat basic metabolic panel and follow-up in 4 to 6 months with Dr. Debara Pickett.  Past Medical History:  Diagnosis Date   BPH (benign prostatic hyperplasia)    CAD (coronary artery disease) of artery bypass graft 2006   Fatty liver    GERD (gastroesophageal reflux disease)  Gilbert's syndrome    Hiatal hernia    History of nuclear stress test 11/2007   bruce myoview; normal pattern of persuion in all regions; post-stress EF 70%; low risk    HTN (hypertension)    Neuropathy    PAF (paroxysmal atrial fibrillation) (HCC)    PVD (peripheral vascular disease) (HCC)    0-49% R & L ICA stenosis (2013)    Renal artery aneurysm (HCC)    S/P CABG (coronary artery bypass graft) 2006   Schatzki's ring    non critical   Thrombocytopenia ([place])     Past Surgical History:  Procedure Laterality Date   CARDIOVERSION N/A 01/14/2018   Procedure: CARDIOVERSION;  Surgeon: [name], MD;  Location: [facility] ENDOSCOPY;  Service: Cardiovascular;  Laterality: N/A;    CATARACT EXTRACTION  2011   COLONOSCOPY  2006   Dr. Margarito Courser pancolonic diverticula   COLONOSCOPY  01/07/2012   Dr. Abbe Amsterdam normal rectum, pancolonic diverticulosis, hyperplastic polyp   COLONOSCOPY N/A 03/20/2015   [id number] diverticulosis   CORONARY ARTERY BYPASS GRAFT  04/2004   LIMA to diagonal, SVG to ramus intermedius, SVG to PDA; placed stent for crossed coronary arteries (Dr. Prescott Gum)   ESOPHAGOGASTRODUODENOSCOPY  2006   Dr. Ruthell Rummage Schatzki's ring, s/p 58-F Maloney dilation   ESOPHAGOGASTRODUODENOSCOPY  01/07/2012   Dr. Gareth Morgan ring, hiatal hernia, fundic gland polyp   ESOPHAGOGASTRODUODENOSCOPY N/A 03/20/2015   FGH:WEXHB 1 varices/HH Gastric polpy s/p bx   ESOPHAGOGASTRODUODENOSCOPY N/A 07/28/2018   Dr. Gala Romney: 3 short columns of grade 1 esophageal varices.  Esophageal dilation performed for dysphagia.  Multiple gastric polyps previously proved benign.   MALONEY DILATION N/A 07/28/2018   Procedure: Venia Minks DILATION;  Surgeon: Daneil Dolin, MD;  Location: [facility] ENDO SUITE;  Service: Endoscopy;  Laterality: N/A;   rotater cuff     repair   TONSILLECTOMY      Current Medications: Current Meds  Medication Sig   albuterol (PROVENTIL HFA;VENTOLIN HFA) 108 (90 Base) MCG/ACT inhaler Inhale 1-2 puffs into the lungs every 6 (six) hours as needed for wheezing or shortness of breath.    allopurinol (ZYLOPRIM) 100 MG tablet Take 100 mg by mouth at bedtime.    Alpha-D-Galactosidase (BEANO ULTRA 800) TABS Take 1 tablet by mouth daily as needed (gas).   amLODipine (NORVASC) 5 MG tablet Take 5 mg by mouth daily.    apixaban (ELIQUIS) 5 MG TABS tablet Take 1 tablet (5 mg total) by mouth 2 (two) times daily.   cyclobenzaprine (FLEXERIL) 10 MG tablet Take 10 mg by mouth 3 (three) times daily as needed for muscle spasms.   finasteride (PROSCAR) 5 MG tablet Take 5 mg by mouth at bedtime.    fluticasone (FLONASE) 50 MCG/ACT nasal spray Place 1 spray into both  nostrils 2 (two) times a day.   furosemide (LASIX) 40 MG tablet Take 40 mg by mouth daily. 1 Tablet Daily.   gabapentin (NEURONTIN) 300 MG capsule Take 300 mg by mouth 3 (three) times daily.     ibuprofen (ADVIL) 200 MG tablet Take 200 mg by mouth every 6 (six) hours as needed.   Levothyroxine Sodium 112 MCG CAPS Take 112 mcg by mouth daily before breakfast.    losartan (COZAAR) 100 MG tablet Take 50 mg by mouth daily.   metoprolol tartrate (LOPRESSOR) 25 MG tablet Take 1 tablet (25 mg total) by mouth 2 (two) times daily.   mometasone (NASONEX) 50 MCG/ACT nasal spray Place 2 sprays into the nose daily.   nitroGLYCERIN (  NITROSTAT) 0.4 MG SL tablet Place 1 tablet (0.4 mg total) under the tongue every 5 (five) minutes as needed for chest pain. Max 3 doses   RABEprazole (ACIPHEX) 20 MG tablet Take 1 tablet (20 mg total) by mouth 2 (two) times daily. (Patient taking differently: Take 20 mg by mouth daily.)   tamsulosin (FLOMAX) 0.4 MG CAPS capsule Take 0.8 mg by mouth at bedtime.   triamcinolone cream (KENALOG) 0.1 % Apply 1 application topically 2 (two) times daily as needed (FOR ITCHING/).   vitamin B-12 (CYANOCOBALAMIN) 500 MCG tablet Take 500 mcg by mouth daily.   [DISCONTINUED] furosemide (LASIX) 20 MG tablet Take 1 tablet (20 mg total) by mouth daily.     Allergies:   Antihistamines, chlorpheniramine-type   Social History   Socioeconomic History   Marital status: Married    Spouse name: Not on file   Number of children: Not on file   Years of education: Not on file   Highest education level: Not on file  Occupational History   Not on file  Tobacco Use   Smoking status: Former    Packs/day: 1.00    Years: 40.00    Total pack years: 40.00    Types: Cigarettes    Quit date: 04/07/2004    Years since quitting: 17.7   Smokeless tobacco: Never  Substance and Sexual Activity   Alcohol use: Yes    Comment: occassional drinker   Drug use: No   Sexual activity: Never  Other Topics  Concern   Not on file  Social History Narrative   Not on file   Social Determinants of Health   Financial Resource Strain: Not on file  Food Insecurity: Not on file  Transportation Needs: Not on file  Physical Activity: Not on file  Stress: Not on file  Social Connections: Not on file     Family History: The patient's family history includes Diabetes in his brother; Heart attack (age of onset: 82) in his mother; Heart attack (age of onset: 45) in his father; Heart disease in his brother. There is no history of Colon cancer.  ROS:   Please see the history of present illness.     All other systems reviewed and are negative.  EKGs/Labs/Other Studies Reviewed:    The following studies were reviewed today:  Echo 12/16/2021  1. Left ventricular ejection fraction, by estimation, is 50 to 55%. Left  ventricular ejection fraction by 3D volume is 54 %. The left ventricle has  low normal function. The left ventricle has no regional wall motion  abnormalities. There is moderate left   ventricular hypertrophy of the basal-septal segment. Left ventricular  diastolic parameters are indeterminate. Elevated left ventricular  end-diastolic pressure. The average left ventricular global longitudinal  strain is -15.9 %. The global longitudinal  strain is abnormal.   2. Right ventricular systolic function is normal. The right ventricular  size is normal. A Prominent moderator band is visualized. There is mildly  elevated pulmonary artery systolic pressure. The estimated right  ventricular systolic pressure is 09.8  mmHg.   3. The mitral valve is degenerative. Mild mitral valve regurgitation. No  evidence of mitral stenosis. Moderate mitral annular calcification.   4. The aortic valve is tricuspid. There is moderate calcification of the  aortic valve. There is moderate thickening of the aortic valve. Aortic  valve regurgitation is not visualized. Mild to moderate aortic valve  stenosis. Aortic  valve area, by VTI  measures 1.29 cm. Aortic valve mean  gradient measures 10.0 mmHg. Aortic  valve Vmax measures 2.12 m/s.   5. The inferior vena cava is dilated in size with <50% respiratory  variability, suggesting right atrial pressure of 15 mmHg.   6. Compared to echo 02/08/20, mean AV gradient is unchanged but AVA by VTI  has decreased from 1.42cm2 to 1.29cm2 and DVI has decreased form 0.56 to  0.41. LVF also has decreased from 60-65% now to 50-55% and mildly abnormal  LV strain (not done on prior  echo).   EKG:  EKG is ordered today.  The ekg ordered today demonstrates normal sinus rhythm, first-degree AV block.  Recent Labs: 11/25/2021: BNP 237.3; Hemoglobin 10.3; Platelets 173 12/24/2021: BUN 15; Creatinine, Ser 1.24; Potassium 3.9; Sodium 135  Recent Lipid Panel    Component Value Date/Time   CHOL 117 05/10/2014 0955   TRIG 60 05/10/2014 0955   HDL 55 05/10/2014 0955   CHOLHDL 2.4 11/07/2007 0540   VLDL 8 11/07/2007 0540   LDLCALC 50 05/10/2014 0955     Risk Assessment/Calculations:    CHA2DS2-VASc Score = 4   This indicates a 4.8% annual risk of stroke. The patient's score is based upon: CHF History: 0 HTN History: 1 Diabetes History: 0 Stroke History: 0 Vascular Disease History: 1 Age Score: 2 Gender Score: 0          Physical Exam:    VS:  BP (!) 138/52 (BP Location: Left Arm, Patient Position: Sitting, Cuff Size: Normal)   Pulse 64   Ht 5' 8"  (1.727 m)   Wt 188 lb 12.8 oz (85.6 kg)   SpO2 98%   BMI 28.71 kg/m        Wt Readings from Last 3 Encounters:  12/24/21 188 lb 12.8 oz (85.6 kg)  11/25/21 194 lb (88 kg)  04/05/21 185 lb 3.2 oz (84 kg)     GEN:  Well nourished, well developed in no acute distress HEENT: Normal NECK: No JVD; No carotid bruits LYMPHATICS: No lymphadenopathy CARDIAC: RRR, no murmurs, rubs, gallops RESPIRATORY:  Clear to auscultation without rales, wheezing or rhonchi  ABDOMEN: Soft, non-tender,  non-distended MUSCULOSKELETAL:  No edema; No deformity  SKIN: Warm and dry NEUROLOGIC:  Alert and oriented x 3 PSYCHIATRIC:  Normal affect   ASSESSMENT:    1. Leg edema   2. Coronary artery disease involving coronary bypass graft of native heart without angina pectoris   3. Essential hypertension   4. PAF (paroxysmal atrial fibrillation) ([place])   5. Cirrhosis of liver without ascites, unspecified hepatic cirrhosis type (HCC)    PLAN:    In order of problems listed above:  Leg edema: Leg edema has significantly improved after starting on Lasix.  Obtain basic metabolic panel  CAD s/p CABG: Denies any recent chest pain.  Hypertension: Blood pressure stable  PAF: He was in atrial fibrillation during the last office visit, however he has since converted back to sinus rhythm.  Echocardiogram shows his EF is now borderline low around 50 to 55%.  Hopefully with restoration of normal sinus rhythm, will see improvement in the future.  He denies any chest pain  Cirrhosis: No obvious ascites.  Followed by GI service.  May consider switch metoprolol to nonselective beta-blocker in the future.           Medication Adjustments/Labs and Tests Ordered: Current medicines are reviewed at length with the patient today.  Concerns regarding medicines are outlined above.  Orders Placed This Encounter  Procedures   Basic metabolic panel  EKG 12-Lead   No orders of the defined types were placed in this encounter.   Patient Instructions  Medication Instructions:  No Changes *If you need a refill on your cardiac medications before your next appointment, please call your pharmacy*   Lab Work: BMET Today If you have labs (blood work) drawn today and your tests are completely normal, you will receive your results only by: [place] (if you have MyChart) OR A paper copy in the mail If you have any lab test that is abnormal or we need to change your treatment, we will call you to review  the results.   Testing/Procedures: No Testing   Follow-Up: At [facility], you and your health needs are our priority.  As part of our continuing mission to provide you with exceptional heart care, we have created designated Provider Care Teams.  These Care Teams include your primary Cardiologist (physician) and Advanced Practice Providers (APPs -  Physician Assistants and Nurse Practitioners) who all work together to provide you with the care you need, when you need it.  We recommend signing up for the patient portal called "MyChart".  Sign up information is provided on this After Visit Summary.  MyChart is used to connect with patients for Virtual Visits (Telemedicine).  Patients are able to view lab/test results, encounter notes, upcoming appointments, etc.  Non-urgent messages can be sent to your provider as well.   To learn more about what you can do with MyChart, go to [email or web address].    Your next appointment:   4-6 month(s)  The format for your next appointment:   In Person  Provider:   Pixie Casino, MD    Signed, Almyra Deforest, [place]  12/26/2021 9:24 PM    [place]

## 2021-12-25 LAB — BASIC METABOLIC PANEL
BUN/Creatinine Ratio: 12 (ref 10–24)
BUN: 15 mg/dL (ref 8–27)
CO2: 22 mmol/L (ref 20–29)
Calcium: 9.3 mg/dL (ref 8.6–10.2)
Chloride: 98 mmol/L (ref 96–106)
Creatinine, Ser: 1.24 mg/dL (ref 0.76–1.27)
Glucose: 100 mg/dL — ABNORMAL HIGH (ref 70–99)
Potassium: 3.9 mmol/L (ref 3.5–5.2)
Sodium: 135 mmol/L (ref 134–144)
eGFR: 57 mL/min/{1.73_m2} — ABNORMAL LOW (ref >59)

## 2021-12-26 ENCOUNTER — Encounter: Payer: Self-pay | Admitting: Physician Assistant

## 2021-12-30 ENCOUNTER — Other Ambulatory Visit: Payer: Self-pay | Admitting: *Deleted

## 2021-12-30 DIAGNOSIS — R053 Chronic cough: Secondary | ICD-10-CM | POA: Diagnosis not present

## 2021-12-30 MED ORDER — FUROSEMIDE 40 MG PO TABS: 40.0000 mg | ORAL_TABLET | Freq: Every day | ORAL | 1 refills | Status: DC

## 2022-01-07 ENCOUNTER — Telehealth: Payer: Self-pay | Admitting: Internal Medicine

## 2022-01-07 NOTE — Telephone Encounter (Signed)
Returned call to patient who states that he has been experiencing a cough for the last month or so since he has started lasix. Patient states that he looked up online that lasix could cause a cough. Patient states that he has been having issues sleeping due to this cough and reports that he has been to multiple "fast meds" due to this. Patient reports that he does not have any virus/other illness. Patient denies any swelling, weight gain, and denies coughing up any phlegm. Patient states he is going to see an ENT soon as well. Patient would like to know if Dr. Priscille Loveless  has any advice regarding this. Advised I would forward message over. Patient verbalized understanding.

## 2022-01-07 NOTE — Telephone Encounter (Signed)
Pt c/o medication issue:  1. Name of Medication:   furosemide (LASIX) 40 MG tablet   2. How are you currently taking this medication (dosage and times per day)? As prescribed  3. Are you having a reaction (difficulty breathing--STAT)?  No  4. What is your medication issue?   Patient stated he would like to get another medication as this medication has been causing him to be coughing all the time.

## 2022-01-09 ENCOUNTER — Emergency Department (HOSPITAL_COMMUNITY): Payer: No Typology Code available for payment source

## 2022-01-09 ENCOUNTER — Other Ambulatory Visit: Payer: Self-pay

## 2022-01-09 ENCOUNTER — Emergency Department (HOSPITAL_COMMUNITY)
Admission: EM | Admit: 2022-01-09 | Discharge: 2022-01-10 | Disposition: A | Discharge status: Home / Self Care | Payer: No Typology Code available for payment source | Attending: Emergency Medicine | Admitting: Emergency Medicine

## 2022-01-09 DIAGNOSIS — R052 Subacute cough: Secondary | ICD-10-CM | POA: Insufficient documentation

## 2022-01-09 DIAGNOSIS — I10 Essential (primary) hypertension: Secondary | ICD-10-CM | POA: Diagnosis not present

## 2022-01-09 DIAGNOSIS — Z79899 Other long term (current) drug therapy: Secondary | ICD-10-CM | POA: Diagnosis not present

## 2022-01-09 DIAGNOSIS — R0602 Shortness of breath: Secondary | ICD-10-CM | POA: Diagnosis present

## 2022-01-09 DIAGNOSIS — Z1152 Encounter for screening for COVID-19: Secondary | ICD-10-CM | POA: Insufficient documentation

## 2022-01-09 DIAGNOSIS — Z7901 Long term (current) use of anticoagulants: Secondary | ICD-10-CM | POA: Diagnosis not present

## 2022-01-09 LAB — BASIC METABOLIC PANEL
Anion gap: 10 (ref 5–15)
BUN: 18 mg/dL (ref 8–23)
CO2: 24 mmol/L (ref 22–32)
Calcium: 9 mg/dL (ref 8.9–10.3)
Chloride: 101 mmol/L (ref 98–111)
Creatinine, Ser: 1.23 mg/dL (ref 0.61–1.24)
GFR, Estimated: 57 mL/min — ABNORMAL LOW (ref >60)
Glucose, Bld: 129 mg/dL — ABNORMAL HIGH (ref 70–99)
Potassium: 3.8 mmol/L (ref 3.5–5.1)
Sodium: 135 mmol/L (ref 135–145)

## 2022-01-09 LAB — CBC WITH DIFFERENTIAL/PLATELET
Abs Immature Granulocytes: 0.02 10*3/uL (ref 0.00–0.07)
Basophils Absolute: 0 10*3/uL (ref 0.0–0.1)
Basophils Relative: 0 %
Eosinophils Absolute: 0.2 10*3/uL (ref 0.0–0.5)
Eosinophils Relative: 4 %
HCT: 31.1 % — ABNORMAL LOW (ref 39.0–52.0)
Hemoglobin: 10.6 g/dL — ABNORMAL LOW (ref 13.0–17.0)
Immature Granulocytes: 0 %
Lymphocytes Relative: 16 %
Lymphs Abs: 0.9 10*3/uL (ref 0.7–4.0)
MCH: 33.1 pg (ref 26.0–34.0)
MCHC: 34.1 g/dL (ref 30.0–36.0)
MCV: 97.2 fL (ref 80.0–100.0)
Monocytes Absolute: 0.5 10*3/uL (ref 0.1–1.0)
Monocytes Relative: 9 %
Neutro Abs: 4 10*3/uL (ref 1.7–7.7)
Neutrophils Relative %: 71 %
Platelets: 154 10*3/uL (ref 150–400)
RBC: 3.2 MIL/uL — ABNORMAL LOW (ref 4.22–5.81)
RDW: 12.5 % (ref 11.5–15.5)
WBC: 5.7 10*3/uL (ref 4.0–10.5)
nRBC: 0 % (ref 0.0–0.2)

## 2022-01-09 LAB — BRAIN NATRIURETIC PEPTIDE: B Natriuretic Peptide: 128 pg/mL — ABNORMAL HIGH (ref 0.0–100.0)

## 2022-01-09 MED ORDER — ALBUTEROL SULFATE HFA 108 (90 BASE) MCG/ACT IN AERS
2.0000 | INHALATION_SPRAY | Freq: Once | RESPIRATORY_TRACT | Status: AC
  Administered 2022-01-09: 2 via RESPIRATORY_TRACT
  Filled 2022-01-09: qty 6.7

## 2022-01-09 NOTE — ED Triage Notes (Signed)
Pt presents with SOB x 1 week, currently increased his lasix, per pt he's taking it for leg swelling.

## 2022-01-10 LAB — RESP PANEL BY RT-PCR (FLU A&B, COVID) ARPGX2
Influenza A by PCR: NEGATIVE
Influenza B by PCR: NEGATIVE
SARS Coronavirus 2 by RT PCR: NEGATIVE

## 2022-01-10 MED ORDER — PREDNISONE 20 MG PO TABS: 40.0000 mg | ORAL_TABLET | Freq: Every day | ORAL | 0 refills | Status: DC

## 2022-01-10 MED ORDER — PREDNISONE 50 MG PO TABS
60.0000 mg | ORAL_TABLET | Freq: Once | ORAL | Status: AC
  Administered 2022-01-10: 60 mg via ORAL
  Filled 2022-01-10: qty 1

## 2022-01-10 NOTE — ED Provider Notes (Signed)
[facility] EMERGENCY DEPARTMENT Provider Note   CSN: [id number] Arrival date & time: 01/09/22  1752     History  Chief Complaint  Patient presents with   Shortness of Breath    [name] is a 88 y.o. male.  HPI     This is an 88 year old male who presents with cough.  Patient reports 2-week history of nonproductive cough.  He also endorses some shortness of breath.  No chest pain.  Patient states that he did have some leg swelling.  He increased his Lasix and began to wear compression stockings and that has improved.  He has not had any fevers.  He has a remote history of strict smoking but no known history of COPD.  Home Medications Prior to Admission medications   Medication Sig Start Date End Date Taking? Authorizing Provider  predniSONE (DELTASONE) 20 MG tablet Take 2 tablets (40 mg total) by mouth daily. 01/10/22  Yes Guillaume Weninger, Barbette Hair, MD  albuterol (PROVENTIL HFA;VENTOLIN HFA) 108 (90 Base) MCG/ACT inhaler Inhale 1-2 puffs into the lungs every 6 (six) hours as needed for wheezing or shortness of breath.     [provider]  allopurinol (ZYLOPRIM) 100 MG tablet Take 100 mg by mouth at bedtime.     [provider]  Alpha-D-Galactosidase (BEANO ULTRA 800) TABS Take 1 tablet by mouth daily as needed (gas).    [provider]  amLODipine (NORVASC) 5 MG tablet Take 5 mg by mouth daily.     [provider]  apixaban (ELIQUIS) 5 MG TABS tablet Take 1 tablet (5 mg total) by mouth 2 (two) times daily. 01/24/16   Hilty, Nadean Corwin, MD  cyclobenzaprine (FLEXERIL) 10 MG tablet Take 10 mg by mouth 3 (three) times daily as needed for muscle spasms.    [provider]  finasteride (PROSCAR) 5 MG tablet Take 5 mg by mouth at bedtime.     [provider]  fluticasone (FLONASE) 50 MCG/ACT nasal spray Place 1 spray into both nostrils 2 (two) times a day.    [provider]  furosemide (LASIX) 40 MG tablet Take 1 tablet (40 mg  total) by mouth daily. 1 Tablet Daily. 12/30/21   Hilty, Nadean Corwin, MD  gabapentin (NEURONTIN) 300 MG capsule Take 300 mg by mouth 3 (three) times daily.      [provider]  ibuprofen (ADVIL) 200 MG tablet Take 200 mg by mouth every 6 (six) hours as needed.    [provider]  Levothyroxine Sodium 112 MCG CAPS Take 112 mcg by mouth daily before breakfast.     [provider]  losartan (COZAAR) 100 MG tablet Take 50 mg by mouth daily.    [provider]  metoprolol tartrate (LOPRESSOR) 25 MG tablet Take 1 tablet (25 mg total) by mouth 2 (two) times daily. 11/18/21   Hilty, Nadean Corwin, MD  mometasone (NASONEX) 50 MCG/ACT nasal spray Place 2 sprays into the nose daily.    [provider]  nitroGLYCERIN (NITROSTAT) 0.4 MG SL tablet Place 1 tablet (0.4 mg total) under the tongue every 5 (five) minutes as needed for chest pain. Max 3 doses 12/25/20   Hilty, Nadean Corwin, MD  RABEprazole (ACIPHEX) 20 MG tablet Take 1 tablet (20 mg total) by mouth 2 (two) times daily. Patient taking differently: Take 20 mg by mouth daily. 09/10/18   Rourk, Cristopher Estimable, MD  tamsulosin (FLOMAX) 0.4 MG CAPS capsule Take 0.8 mg by mouth at bedtime.  [provider]  triamcinolone cream (KENALOG) 0.1 % Apply 1 application topically 2 (two) times daily as needed (FOR ITCHING/).    [provider]  vitamin B-12 (CYANOCOBALAMIN) 500 MCG tablet Take 500 mcg by mouth daily.    [provider]      Allergies    Antihistamines, chlorpheniramine-type    Review of Systems   Review of Systems  Respiratory:  Positive for cough and shortness of breath.   Cardiovascular:  Negative for chest pain.  All other systems reviewed and are negative.   Physical Exam Updated Vital Signs BP (!) 156/56 (BP Location: Right Arm)   Pulse 74   Temp 98.2 F (36.8 C) (Oral)   Resp 18   Ht 1.727 m (5' 8" )   Wt 81.6 kg   SpO2 100%   BMI 27.37 kg/m  Physical Exam Vitals and  nursing note reviewed.  Constitutional:      Appearance: He is well-developed. He is obese. He is not ill-appearing.  HENT:     Head: Normocephalic and atraumatic.  Eyes:     Pupils: Pupils are equal, round, and reactive to light.  Cardiovascular:     Rate and Rhythm: Normal rate and regular rhythm.     Heart sounds: Normal heart sounds. No murmur heard. Pulmonary:     Effort: Pulmonary effort is normal. No respiratory distress.     Breath sounds: Normal breath sounds. No wheezing.     Comments: Occasional cough, Rales left midlung Abdominal:     General: Bowel sounds are normal.     Palpations: Abdomen is soft.     Tenderness: There is no abdominal tenderness. There is no rebound.  Musculoskeletal:     Cervical back: Neck supple.     Right lower leg: Edema present.     Left lower leg: Edema present.     Comments: Trace bilateral lower extremity edema  Lymphadenopathy:     Cervical: No cervical adenopathy.  Skin:    General: Skin is warm and dry.  Neurological:     Mental Status: He is alert and oriented to person, place, and time.  Psychiatric:        Mood and Affect: Mood normal.     ED Results / Procedures / Treatments   Labs (all labs ordered are listed, but only abnormal results are displayed) Labs Reviewed  CBC WITH DIFFERENTIAL/PLATELET - Abnormal; Notable for the following components:      Result Value   RBC 3.20 (*)    Hemoglobin 10.6 (*)    HCT 31.1 (*)    All other components within normal limits  BASIC METABOLIC PANEL - Abnormal; Notable for the following components:   Glucose, Bld 129 (*)    GFR, Estimated 57 (*)    All other components within normal limits  BRAIN NATRIURETIC PEPTIDE - Abnormal; Notable for the following components:   B Natriuretic Peptide 128.0 (*)    All other components within normal limits  RESP PANEL BY RT-PCR (FLU A&B, COVID) [id number]    EKG EKG Interpretation  Date/Time:  Thursday January 09 2022 18:34:14 EST Ventricular  Rate:  71 PR Interval:  360 QRS Duration: 106 QT Interval:  454 QTC Calculation: 493 R Axis:   -10 Text Interpretation: Sinus rhythm with 1st degree A-V block with occasional Premature ventricular complexes Septal infarct , age undetermined Abnormal ECG When compared with ECG of 03-Jan-2021 13:21, Premature ventricular complexes are now Present Septal infarct is now Present T wave inversion  now evident in Inferior leads T wave inversion now evident in Lateral leads Confirmed by Thayer Jew [phone]) on 01/09/2022 11:27:33 PM  Radiology DG Chest 2 View  Result Date: 01/09/2022 CLINICAL DATA:  Shortness of breath. EXAM: CHEST - 2 VIEW COMPARISON:  Chest radiograph dated 11/07/2015. FINDINGS: No focal consolidation, pleural effusion, or pneumothorax. The cardiac silhouette is within limits. Median sternotomy wires and CABG vascular clips. No acute osseous pathology. Degenerative changes of the spine. IMPRESSION: No active cardiopulmonary disease. Electronically Signed   By: Anner Crete M.D.   On: 01/09/2022 19:13    Procedures Procedures    Medications Ordered in ED Medications  predniSONE (DELTASONE) tablet 60 mg (has no administration in time range)  albuterol (VENTOLIN HFA) 108 (90 Base) MCG/ACT inhaler 2 puff (2 puffs Inhalation Given 01/09/22 2338)    ED Course/ Medical Decision Making/ A&P                           Medical Decision Making Amount and/or Complexity of Data Reviewed Labs: ordered. Radiology: ordered.  Risk Prescription drug management.   This patient presents to the ED for concern of cough, this involves an extensive number of treatment options, and is a complaint that carries with it a high risk of complications and morbidity.  I considered the following differential and admission for this acute, potentially life threatening condition.  The differential diagnosis includes viral infection such as COVID or influenza, pneumonia, CHF, bronchospasm  MDM:     This is a 88 year old male who presents with cough.  He is otherwise nontoxic and vital signs are reassuring.  He denies fevers.  Cough has been ongoing for several weeks.  Has reported some lower extremity swelling; however that has improved with Lasix.  Labs obtained and reviewed.  Largely reassuring.  Slightly elevated BN TP at 128 but chest x-ray does not show any evidence of volume overload or pulmonary edema.  EKG without acute ischemic changes.  COVID and influenza testing again today are negative.  He does have a remote history of smoking.  Although he is not wheezing, suspect he may have an element of bronchospasm.  He was given an inhaler.  Objectively had improvement of cough at bedside.  Will start on a 5-day course of steroids and have encouraged inhaler use.  Follow-up with primary physician.  (Labs, imaging, consults)  Labs: I Ordered, and personally interpreted labs.  The pertinent results include: CBC, BMP, BNP  Imaging Studies ordered: I ordered imaging studies including chest x-ray I independently visualized and interpreted imaging. I agree with the radiologist interpretation  Additional history obtained from wife at bedside.  External records from outside source obtained and reviewed including prior evaluations  Cardiac Monitoring: The patient was maintained on a cardiac monitor.  I personally viewed and interpreted the cardiac monitored which showed an underlying rhythm of: SInus rhythm  Reevaluation: After the interventions noted above, I reevaluated the patient and found that they have : Improved  Social Determinants of Health:  lives independently  Disposition: Discharge  Co morbidities that complicate the patient evaluation  Past Medical History:  Diagnosis Date   BPH (benign prostatic hyperplasia)    CAD (coronary artery disease) of artery bypass graft 2006   Fatty liver    GERD (gastroesophageal reflux disease)    Gilbert's syndrome    Hiatal hernia     History of nuclear stress test 11/2007   bruce myoview; normal pattern of persuion  in all regions; post-stress EF 70%; low risk    HTN (hypertension)    Neuropathy    PAF (paroxysmal atrial fibrillation) (HCC)    PVD (peripheral vascular disease) (HCC)    0-49% R & L ICA stenosis (2013)    Renal artery aneurysm (HCC)    S/P CABG (coronary artery bypass graft) 2006   Schatzki's ring    non critical   Thrombocytopenia (HCC)      Medicines Meds ordered this encounter  Medications   albuterol (VENTOLIN HFA) 108 (90 Base) MCG/ACT inhaler 2 puff   predniSONE (DELTASONE) tablet 60 mg   predniSONE (DELTASONE) 20 MG tablet    Sig: Take 2 tablets (40 mg total) by mouth daily.    Dispense:  8 tablet    Refill:  0    I have reviewed the patients home medicines and have made adjustments as needed  Problem List / ED Course: Problem List Items Addressed This Visit   None Visit Diagnoses     Subacute cough    -  Primary                   Final Clinical Impression(s) / ED Diagnoses Final diagnoses:  Subacute cough    Rx / DC Orders ED Discharge Orders          Ordered    predniSONE (DELTASONE) 20 MG tablet  Daily        01/10/22 0029              Merryl Hacker, MD 01/10/22 [phone]

## 2022-01-10 NOTE — Telephone Encounter (Signed)
Attempted to return call to patient, unable to reach or leave message at this time.    I've never seen lasix cause a cough - not sure where he saw that. I think an ENT referral is a good idea - would advise continuing lasix. Pulmonary edema causes cough - so its possible that is the issue if he is not on enough lasix.  Dr Lemmie Evens

## 2022-01-10 NOTE — Discharge Instructions (Signed)
You were seen today for ongoing cough.  Your workup is reassuring.  There is no evidence of pneumonia.  Your COVID and flu tests are negative.  Cough may be related to your prior history of smoking.  You will be placed on a short course of steroids.  Take your first dose tomorrow.  Use your inhaler as needed for cough.

## 2022-01-16 NOTE — Telephone Encounter (Signed)
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 He sees cancer doctor tomorrow and ENT next week. He will follow up if needed.

## 2022-01-17 DIAGNOSIS — Z8521 Personal history of malignant neoplasm of larynx: Secondary | ICD-10-CM | POA: Diagnosis not present

## 2022-01-17 DIAGNOSIS — Z87891 Personal history of nicotine dependence: Secondary | ICD-10-CM | POA: Diagnosis not present

## 2022-01-17 DIAGNOSIS — Z923 Personal history of irradiation: Secondary | ICD-10-CM | POA: Diagnosis not present

## 2022-01-20 DIAGNOSIS — K219 Gastro-esophageal reflux disease without esophagitis: Secondary | ICD-10-CM | POA: Diagnosis not present

## 2022-01-20 DIAGNOSIS — C329 Malignant neoplasm of larynx, unspecified: Secondary | ICD-10-CM | POA: Diagnosis not present

## 2022-01-20 DIAGNOSIS — J329 Chronic sinusitis, unspecified: Secondary | ICD-10-CM | POA: Diagnosis not present

## 2022-01-20 DIAGNOSIS — R053 Chronic cough: Secondary | ICD-10-CM | POA: Diagnosis not present

## 2022-02-06 ENCOUNTER — Telehealth: Payer: Self-pay | Admitting: Internal Medicine

## 2022-02-06 NOTE — Telephone Encounter (Signed)
Attempted to call back patient. Phone rang and then no one answered and call ended.

## 2022-02-06 NOTE — Telephone Encounter (Signed)
Patient is requesting to speak with Dr. Lysbeth Penner nurse ASAP. Patient wil not states what it is regarding.

## 2022-02-07 MED ORDER — AMLODIPINE BESYLATE 5 MG PO TABS: 5.0000 mg | ORAL_TABLET | Freq: Every day | ORAL | 0 refills | Status: AC

## 2022-02-07 NOTE — Telephone Encounter (Signed)
Vascular scheduler informed RN patient left message on voicemail to call back. Called patient, no answer-- left message to call back

## 2022-02-07 NOTE — Telephone Encounter (Signed)
Left message to call back  

## 2022-02-07 NOTE — Telephone Encounter (Signed)
Patient  to called . He states he needs prescription of Amlodipine 10 mg to [facility].  RN informed patient that unable to send to  pharmacy - our records show he is taking 5 mg daily.  [facility] RN informed patient will send in the 5 mg dose. Patient states he will contact [facility] - Monday.

## 2022-02-07 NOTE — Telephone Encounter (Signed)
Pt returning call

## 2022-02-11 DIAGNOSIS — J329 Chronic sinusitis, unspecified: Secondary | ICD-10-CM | POA: Diagnosis not present

## 2022-05-15 ENCOUNTER — Ambulatory Visit: Payer: No Typology Code available for payment source | Admitting: Internal Medicine

## 2022-05-28 ENCOUNTER — Encounter: Payer: Self-pay | Admitting: Internal Medicine

## 2022-06-03 NOTE — Progress Notes (Unsigned)
GI Office Note    Referring Provider: Donnita Falls* Primary Care Physician:  Viann Shove, MD  Primary Gastroenterologist: Roetta Sessions, MD   Chief Complaint   No chief complaint on file.    History of Present Illness   [name] is a 89 y.o. male presenting today at the request of [facility] [place] for fatty liver.  Patient last seen in 2021.  At that time he had recently been found to have throat cancer.  He has a prior history of well compensated NASH cirrhosis.  Labs received from March 2021:   Glucose 97, sodium 140, BUN 15, creatinine 1.13, hemoglobin 11.9 low, hematocrit 34.8 low, MCV 99.1 high, platelets 176,000, albumin 4.1, total bilirubin 1.1, alk phos 117, AST 32, ALT 31   H/H stable.    Due ov with RMR 10/2019 (anemia, cirrhosis). Please schedule.  EGD 07/2018: 3 short columns of grade 1 esophageal varices. Esophageal dilation performed for dysphagia. Multiple gastric polyps previously proved benign. Did not want to change metoprolol to a nonselective beta blocker because metoprolol is "life-saving".   Right upper quadrant ultrasound October 2020: Echogenic parenchyma, likely fatty infiltration though can be seen with cirrhosis.  History of varices as outlined.  Last labs in October 2020.  Creatinine 1.12, albumin 4.3, total bilirubin 0.9, alkaline phosphatase 25, AST 40, ALT 21, albumin 4.3.  Hemoglobin 11.5, hematocrit 32.7.  Instructed had I FOBT and repeat CBC along with iron studies.     Medications   Current Outpatient Medications  Medication Sig Dispense Refill   albuterol (PROVENTIL HFA;VENTOLIN HFA) 108 (90 Base) MCG/ACT inhaler Inhale 1-2 puffs into the lungs every 6 (six) hours as needed for wheezing or shortness of breath.      allopurinol (ZYLOPRIM) 100 MG tablet Take 100 mg by mouth at bedtime.      Alpha-D-Galactosidase (BEANO ULTRA 800) TABS Take 1 tablet by mouth daily as needed (gas).     amLODipine (NORVASC) 5 MG  tablet Take 1 tablet (5 mg total) by mouth daily. 90 tablet 0   apixaban (ELIQUIS) 5 MG TABS tablet Take 1 tablet (5 mg total) by mouth 2 (two) times daily. 180 tablet 3   cyclobenzaprine (FLEXERIL) 10 MG tablet Take 10 mg by mouth 3 (three) times daily as needed for muscle spasms.     finasteride (PROSCAR) 5 MG tablet Take 5 mg by mouth at bedtime.      fluticasone (FLONASE) 50 MCG/ACT nasal spray Place 1 spray into both nostrils 2 (two) times a day.     furosemide (LASIX) 40 MG tablet Take 1 tablet (40 mg total) by mouth daily. 1 Tablet Daily. 90 tablet 1   gabapentin (NEURONTIN) 300 MG capsule Take 300 mg by mouth 3 (three) times daily.       ibuprofen (ADVIL) 200 MG tablet Take 200 mg by mouth every 6 (six) hours as needed.     Levothyroxine Sodium 112 MCG CAPS Take 112 mcg by mouth daily before breakfast.      losartan (COZAAR) 100 MG tablet Take 50 mg by mouth daily.     metoprolol tartrate (LOPRESSOR) 25 MG tablet Take 1 tablet (25 mg total) by mouth 2 (two) times daily. 180 tablet 3   mometasone (NASONEX) 50 MCG/ACT nasal spray Place 2 sprays into the nose daily.     nitroGLYCERIN (NITROSTAT) 0.4 MG SL tablet Place 1 tablet (0.4 mg total) under the tongue every 5 (five) minutes as needed for chest  pain. Max 3 doses 25 tablet 3   predniSONE (DELTASONE) 20 MG tablet Take 2 tablets (40 mg total) by mouth daily. 8 tablet 0   RABEprazole (ACIPHEX) 20 MG tablet Take 1 tablet (20 mg total) by mouth 2 (two) times daily. (Patient taking differently: Take 20 mg by mouth daily.) 180 tablet 3   tamsulosin (FLOMAX) 0.4 MG CAPS capsule Take 0.8 mg by mouth at bedtime.     triamcinolone cream (KENALOG) 0.1 % Apply 1 application topically 2 (two) times daily as needed (FOR ITCHING/).     vitamin B-12 (CYANOCOBALAMIN) 500 MCG tablet Take 500 mcg by mouth daily.     No current facility-administered medications for this visit.    Allergies   Allergies as of 06/04/2022 - Review Complete 01/09/2022   Allergen Reaction Noted   Antihistamines, chlorpheniramine-type Rash 08/26/2011    Past Medical History   Past Medical History:  Diagnosis Date   BPH (benign prostatic hyperplasia)    CAD (coronary artery disease) of artery bypass graft 2006   Fatty liver    GERD (gastroesophageal reflux disease)    Gilbert's syndrome    Hiatal hernia    History of nuclear stress test 11/2007   bruce myoview; normal pattern of persuion in all regions; post-stress EF 70%; low risk    HTN (hypertension)    Neuropathy    PAF (paroxysmal atrial fibrillation) (HCC)    PVD (peripheral vascular disease) (HCC)    0-49% R & L ICA stenosis (2013)    Renal artery aneurysm (HCC)    S/P CABG (coronary artery bypass graft) 2006   Schatzki's ring    non critical   Thrombocytopenia (HCC)     Past Surgical History   Past Surgical History:  Procedure Laterality Date   CARDIOVERSION N/A 01/14/2018   Procedure: CARDIOVERSION;  Surgeon: Parke Poisson, MD;  Location: [facility] ENDOSCOPY;  Service: Cardiovascular;  Laterality: N/A;   CATARACT EXTRACTION  2011   COLONOSCOPY  2006   Dr. Houston Siren pancolonic diverticula   COLONOSCOPY  01/07/2012   Dr. Midge Minium normal rectum, pancolonic diverticulosis, hyperplastic polyp   COLONOSCOPY N/A 03/20/2015   [id number] diverticulosis   CORONARY ARTERY BYPASS GRAFT  04/2004   LIMA to diagonal, SVG to ramus intermedius, SVG to PDA; placed stent for crossed coronary arteries (Dr. Donata Clay)   ESOPHAGOGASTRODUODENOSCOPY  2006   Dr. Reita Chard Schatzki's ring, s/p 58-F Maloney dilation   ESOPHAGOGASTRODUODENOSCOPY  01/07/2012   Dr. Chauncey Cruel ring, hiatal hernia, fundic gland polyp   ESOPHAGOGASTRODUODENOSCOPY N/A 03/20/2015   [id number] 1 varices/HH Gastric polpy s/p bx   ESOPHAGOGASTRODUODENOSCOPY N/A 07/28/2018   Dr. Jena Gauss: 3 short columns of grade 1 esophageal varices.  Esophageal dilation performed for dysphagia.  Multiple gastric polyps  previously proved benign.   MALONEY DILATION N/A 07/28/2018   Procedure: Elease Hashimoto DILATION;  Surgeon: Corbin Ade, MD;  Location: [facility] ENDO SUITE;  Service: Endoscopy;  Laterality: N/A;   rotater cuff     repair   TONSILLECTOMY      Past Family History   Family History  Problem Relation Age of Onset   Heart attack Mother 67       deceased   Heart attack Father 71       deceased   Heart disease Brother        x2   Diabetes Brother        x3   Colon cancer Neg Hx     Past Social History   Social History  Socioeconomic History   Marital status: Married    Spouse name: Not on file   Number of children: Not on file   Years of education: Not on file   Highest education level: Not on file  Occupational History   Not on file  Tobacco Use   Smoking status: Former    Packs/day: 1.00    Years: 40.00    Additional pack years: 0.00    Total pack years: 40.00    Types: Cigarettes    Quit date: 04/07/2004    Years since quitting: 18.1   Smokeless tobacco: Never  Substance and Sexual Activity   Alcohol use: Yes    Comment: occassional drinker   Drug use: No   Sexual activity: Never  Other Topics Concern   Not on file  Social History Narrative   Not on file   Social Determinants of Health   Financial Resource Strain: Not on file  Food Insecurity: Not on file  Transportation Needs: Not on file  Physical Activity: Not on file  Stress: Not on file  Social Connections: Not on file  Intimate Partner Violence: Not on file    Review of Systems   General: Negative for anorexia, weight loss, fever, chills, fatigue, weakness. Eyes: Negative for vision changes.  ENT: Negative for hoarseness, difficulty swallowing , nasal congestion. CV: Negative for chest pain, angina, palpitations, dyspnea on exertion, peripheral edema.  Respiratory: Negative for dyspnea at rest, dyspnea on exertion, cough, sputum, wheezing.  GI: See history of present illness. GU:  Negative for dysuria,  hematuria, urinary incontinence, urinary frequency, nocturnal urination.  MS: Negative for joint pain, low back pain.  Derm: Negative for rash or itching.  Neuro: Negative for weakness, abnormal sensation, seizure, frequent headaches, memory loss,  confusion.  Psych: Negative for anxiety, depression, suicidal ideation, hallucinations.  Endo: Negative for unusual weight change.  Heme: Negative for bruising or bleeding. Allergy: Negative for rash or hives.  Physical Exam   There were no vitals taken for this visit.   General: Well-nourished, well-developed in no acute distress.  Head: Normocephalic, atraumatic.   Eyes: Conjunctiva pink, no icterus. Mouth: Oropharyngeal mucosa moist and pink , no lesions erythema or exudate. Neck: Supple without thyromegaly, masses, or lymphadenopathy.  Lungs: Clear to auscultation bilaterally.  Heart: Regular rate and rhythm, no murmurs rubs or gallops.  Abdomen: Bowel sounds are normal, nontender, nondistended, no hepatosplenomegaly or masses,  no abdominal bruits or hernia, no rebound or guarding.   Rectal: *** Extremities: No lower extremity edema. No clubbing or deformities.  Neuro: Alert and oriented x 4 , grossly normal neurologically.  Skin: Warm and dry, no rash or jaundice.   Psych: Alert and cooperative, normal mood and affect.  Labs   Lab Results  Component Value Date   CREATININE 1.23 01/09/2022   BUN 18 01/09/2022   NA 135 01/09/2022   K 3.8 01/09/2022   CL 101 01/09/2022   CO2 24 01/09/2022   Lab Results  Component Value Date   WBC 5.7 01/09/2022   HGB 10.6 (L) 01/09/2022   HCT 31.1 (L) 01/09/2022   MCV 97.2 01/09/2022   PLT 154 01/09/2022      Imaging Studies   No results found.  Assessment       PLAN   ***   Leanna Battles. Melvyn Neth, MHS, PA-C [facility]

## 2022-06-04 ENCOUNTER — Ambulatory Visit (INDEPENDENT_AMBULATORY_CARE_PROVIDER_SITE_OTHER): Payer: No Typology Code available for payment source | Admitting: Gastroenterology

## 2022-06-04 ENCOUNTER — Encounter: Payer: Self-pay | Admitting: Gastroenterology

## 2022-06-04 VITALS — BP 136/69 | HR 76 | Temp 98.1°F | Ht 68.0 in | Wt 181.4 lb

## 2022-06-04 DIAGNOSIS — R1319 Other dysphagia: Secondary | ICD-10-CM

## 2022-06-04 DIAGNOSIS — K7460 Unspecified cirrhosis of liver: Secondary | ICD-10-CM

## 2022-06-04 DIAGNOSIS — K219 Gastro-esophageal reflux disease without esophagitis: Secondary | ICD-10-CM | POA: Diagnosis not present

## 2022-06-04 DIAGNOSIS — I8510 Secondary esophageal varices without bleeding: Secondary | ICD-10-CM | POA: Diagnosis not present

## 2022-06-04 NOTE — Patient Instructions (Signed)
I have looked at the [facility] approval papers. You are cleared for labs and ultrasound pertaining to work up of the liver. You are also good for upper endoscopy. Please complete labs and ultrasound as scheduled. Upper endoscopy to be scheduled once approval from Dr.Hilty to hold your Eliquis has been received.  Continue rabeprazole 20 mg daily daily, 30 - 60 minutes before breakfast and evening meal. We may back you down to once daily after your endoscopy.

## 2022-06-05 ENCOUNTER — Telehealth: Payer: Self-pay | Admitting: *Deleted

## 2022-06-05 LAB — CBC WITH DIFFERENTIAL/PLATELET
Basophils Absolute: 0 10*3/uL (ref 0.0–0.2)
Basos: 1 %
EOS (ABSOLUTE): 0.2 10*3/uL (ref 0.0–0.4)
Eos: 5 %
Hematocrit: 30.3 % — ABNORMAL LOW (ref 37.5–51.0)
Hemoglobin: 10.2 g/dL — ABNORMAL LOW (ref 13.0–17.7)
Immature Grans (Abs): 0 10*3/uL (ref 0.0–0.1)
Immature Granulocytes: 0 %
Lymphocytes Absolute: 0.8 10*3/uL (ref 0.7–3.1)
Lymphs: 19 %
MCH: 31 pg (ref 26.6–33.0)
MCHC: 33.7 g/dL (ref 31.5–35.7)
MCV: 92 fL (ref 79–97)
Monocytes Absolute: 0.5 10*3/uL (ref 0.1–0.9)
Monocytes: 11 %
Neutrophils Absolute: 2.8 10*3/uL (ref 1.4–7.0)
Neutrophils: 64 %
Platelets: 152 10*3/uL (ref 150–450)
RBC: 3.29 x10E6/uL — ABNORMAL LOW (ref 4.14–5.80)
RDW: 12.4 % (ref 11.6–15.4)
WBC: 4.2 10*3/uL (ref 3.4–10.8)

## 2022-06-05 LAB — COMPREHENSIVE METABOLIC PANEL
ALT: 9 IU/L (ref 0–44)
AST: 20 IU/L (ref 0–40)
Albumin/Globulin Ratio: 1.9 (ref 1.2–2.2)
Albumin: 4.2 g/dL (ref 3.7–4.7)
Alkaline Phosphatase: 143 IU/L — ABNORMAL HIGH (ref 44–121)
BUN/Creatinine Ratio: 15 (ref 10–24)
BUN: 18 mg/dL (ref 8–27)
Bilirubin Total: 0.6 mg/dL (ref 0.0–1.2)
CO2: 22 mmol/L (ref 20–29)
Calcium: 9.4 mg/dL (ref 8.6–10.2)
Chloride: 103 mmol/L (ref 96–106)
Creatinine, Ser: 1.23 mg/dL (ref 0.76–1.27)
Globulin, Total: 2.2 g/dL (ref 1.5–4.5)
Glucose: 106 mg/dL — ABNORMAL HIGH (ref 70–99)
Potassium: 4.3 mmol/L (ref 3.5–5.2)
Sodium: 141 mmol/L (ref 134–144)
Total Protein: 6.4 g/dL (ref 6.0–8.5)
eGFR: 57 mL/min/{1.73_m2} — ABNORMAL LOW (ref >59)

## 2022-06-05 LAB — PROTIME-INR
INR: 1.1 (ref 0.9–1.2)
Prothrombin Time: 11.5 s (ref 9.1–12.0)

## 2022-06-05 LAB — AFP TUMOR MARKER: AFP, Serum, Tumor Marker: 2.9 ng/mL (ref 0.0–6.4)

## 2022-06-05 NOTE — Telephone Encounter (Signed)
  Request for patient to stop medication prior to procedure or is needing cleareance  06/05/22  [name] [birth year]  What type of surgery is being performed? Esophagogastroduodenoscopy (EGD) with esophageal dilation  When is surgery scheduled? TBD  What type of clearance is required (medical or pharmacy to hold medication or both? medication  Are there any medications that need to be held prior to surgery and how long? Eliquis x 2 day  Name of physician performing surgery?  Dr.Rourk [facility] [employer]: [phone] Fax: [phone]  Anethesia type (none, local, MAC, general)? MAC

## 2022-06-06 NOTE — Telephone Encounter (Signed)
Patient with diagnosis of afib on Eliquis for anticoagulation.    Procedure: EGD Date of procedure: TBD  CHA2DS2-VASc Score = 4  This indicates a 4.8% annual risk of stroke. The patient's score is based upon: CHF History: 0 HTN History: 1 Diabetes History: 0 Stroke History: 0 Vascular Disease History: 1 Age Score: 2 Gender Score: 0   CrCl 67mL/min Platelet count 152K  Per office protocol, patient can hold Eliquis for 2 days prior to procedure as requested.    **This guidance is not considered finalized until pre-operative APP has relayed final recommendations.**

## 2022-06-06 NOTE — Telephone Encounter (Signed)
Pt has appt with Dr. Rennis Golden 06/27/22. I will add pre op clearance needed to appt. Ok per pre op APP.

## 2022-06-06 NOTE — Telephone Encounter (Signed)
   Name: [name]  DOB: 1934/01/12  MRN: [id number]  Primary Cardiologist: Chrystie Nose, MD   Preoperative team, please contact this patient and set up a phone call appointment for further preoperative risk assessment. Please obtain consent and complete medication review. Thank you for your help.  I confirm that guidance regarding antiplatelet and oral anticoagulation therapy has been completed and, if necessary, noted below.  Pharmacy has provided recommendations for holding anticoagulation   Ronney Asters, NP 06/06/2022, 12:03 PM [facility]

## 2022-06-17 ENCOUNTER — Ambulatory Visit (HOSPITAL_COMMUNITY)
Admission: RE | Admit: 2022-06-17 | Discharge: 2022-06-17 | Disposition: A | Discharge status: Home / Self Care | Payer: No Typology Code available for payment source | Source: Ambulatory Visit | Attending: Gastroenterology | Admitting: Gastroenterology

## 2022-06-17 DIAGNOSIS — I8510 Secondary esophageal varices without bleeding: Secondary | ICD-10-CM | POA: Diagnosis present

## 2022-06-17 DIAGNOSIS — K7460 Unspecified cirrhosis of liver: Secondary | ICD-10-CM | POA: Diagnosis present

## 2022-06-17 DIAGNOSIS — R1319 Other dysphagia: Secondary | ICD-10-CM | POA: Diagnosis present

## 2022-06-17 DIAGNOSIS — K219 Gastro-esophageal reflux disease without esophagitis: Secondary | ICD-10-CM | POA: Insufficient documentation

## 2022-06-23 ENCOUNTER — Other Ambulatory Visit: Payer: Self-pay

## 2022-06-23 DIAGNOSIS — K7460 Unspecified cirrhosis of liver: Secondary | ICD-10-CM

## 2022-06-27 ENCOUNTER — Encounter: Payer: Self-pay | Admitting: Internal Medicine

## 2022-06-27 ENCOUNTER — Ambulatory Visit: Payer: No Typology Code available for payment source | Attending: Internal Medicine | Admitting: Internal Medicine

## 2022-06-27 VITALS — BP 124/60 | HR 62 | Ht 68.0 in | Wt 180.2 lb

## 2022-06-27 DIAGNOSIS — I493 Ventricular premature depolarization: Secondary | ICD-10-CM

## 2022-06-27 DIAGNOSIS — I25810 Atherosclerosis of coronary artery bypass graft(s) without angina pectoris: Secondary | ICD-10-CM | POA: Diagnosis not present

## 2022-06-27 DIAGNOSIS — I10 Essential (primary) hypertension: Secondary | ICD-10-CM

## 2022-06-27 DIAGNOSIS — Z951 Presence of aortocoronary bypass graft: Secondary | ICD-10-CM

## 2022-06-27 DIAGNOSIS — Z01810 Encounter for preprocedural cardiovascular examination: Secondary | ICD-10-CM

## 2022-06-27 DIAGNOSIS — I480 Paroxysmal atrial fibrillation: Secondary | ICD-10-CM

## 2022-06-27 NOTE — Progress Notes (Signed)
OFFICE NOTE  Chief Complaint:  Preoperative clearance  Primary Care Physician: Viann Shove, MD  HPI:  [name] is a 89 year old gentleman who had bypass in 2006, a normal nuclear study in 2009 and has done well without any episodes of angina. He has remained active and does exercise several times a week and has had no worsening shortness of breath, palpitations, presyncope or syncopal symptoms. He also has a history of right carotid disease which he was told to be about 60% at the time of bypass, however, recent Dopplers show only mild disease. He does have a very faint bruit. He last saw Wilburt Finlay, PA-C, in the office this past summer. He was having problems with elevated blood pressures at night. It was recommended that he change his blood pressure medications around, however he did not make that change.  He was recently admitted to [facility] for dehydration in the setting of pneumonia or viral influenza. He was taking his diuretics in addition to ongoing fluid losses. This cause orthostatic hypotension. His diuretic was held and his lisinopril was stopped due to worsening renal function. He was switched to amlodipine for better blood pressure control, but has not been taking the medicine due to confusion of his medicines. Currently reports his blood pressures have improved and is not have any significant swelling. In fact he is now hypertensive again. He is recently switched from warfarin over to Xarelto for better control of anticoagulation for his A. fib. He seems to be able to get the medication now with out undue cost.  [name] turns today for followup. He occasionally gets some lightheadedness. He's noted some blood pressures up into the 190s at home. He says however he feels bad when his blood pressure is more in the 120s to 130s.  I saw [name] back today in the office. Overall he reports doing fairly well. He decreased his exercise somewhat due to his wife  being sick but is interested in getting back into exercise routine. His last stress test was 7 years ago. His bypass was now 10 years ago. He denies any chest pain or worsening shortness of breath. Blood pressure is running somewhat high. His diuretics were stopped due to worsening renal function which seems to have normalized.  [name] returns today for follow-up. His main complaint has to do with pain in his back and decreased function in his legs. He says it's very difficult for him to get around. Although he has pain in his legs his symptoms are not necessarily consistent with claudication. He does have a history of decreased ABIs in the past and has not had lower extremity arterial Dopplers in some time. I was also recently reminded that he does have a history of renal artery aneurysm which was noted on the CT scan. This has not been reassessed in several years. He denies any significant symptoms with atrial fibrillation has had no bleeding problems on Savaysa.  01/04/16  [name] was seen today in follow-up. He denies any worsening chest pain or shortness of breath. He continues to have some tightness in his legs. He had normal ABIs by Dopplers last year however will need a repeat of that. His renal Dopplers also failed to indicate any stenosis. He is not in atrial fibrillation today and is concerned about the cost of Savaysa. He is interested in switching NOACs. He also brought lab work from the [place] today which showed adequate thyroid levels, elevated uric acid at 8.1  and LDL of 27 with total cholesterol of 115. He is not on statins.  12/23/2016  [name] returns today for follow-up.  Over the past year he has done well.  He denies any worsening shortness of breath or chest pain.  He had lower extremity Dopplers and carotid Dopplers this past year which indicate no significant insufficiency or stenosis.  He denies any recurrent atrial fibrillation.  He switched from [facility] over to Eliquis.  He  has not had any recent lab work.  He tells me his last lipid profile which I reviewed from the [place] showed his total cholesterol of 115.  He has not been on statin therapy due to this.  Fortunately, he has not had any recurrent coronary disease.  He has had no progressive peripheral arterial disease as well.  12/29/2017  [name] is seen today in annual follow-up.  Overall he is doing well denies any chest pain or worsening shortness of breath.  Carotid Doppler showed no significant carotid stenosis.  He does complain of some neck pain which is likely arthritis.  He has been tolerating Eliquis without bleeding problems.  He has had improvement in swelling on low-dose thiazide.  Cholesterol recently well controlled with total 189, triglycerides 65, HDL 56 and LDL 40.  01/03/2019  [name] is seen today for routine follow-up.  He continues to do well.  Weight is back up about 6 pounds.  He had at one point been below 190 pounds.  He denies chest pain or worsening shortness of breath.  His blood pressure this morning was 130/70 but is elevated some today in the office.  He does report some issues with ongoing urinary frequency particularly at night.  He asked about changing around his medications however this is primarily managed by the [place].  He denies any palpitations.  EKG today shows he is maintaining sinus rhythm with first-degree AV block.  He had no bleeding problems on Eliquis which she takes regularly.  Recent labs showed LDL of 51.  12/27/2019  [name] returns today for follow-up.  He recently was diagnosed with a throat cancer.  He underwent partial resection and radiation therapy and seems to be doing well.  He said a PET scan indicated no evidence of any spread of the cancer.  He struggled with hoarseness of his voice.  He denies any chest pain or shortness of breath.  He says he was able to play golf throughout the summer without any issues.  EKG shows a sinus bradycardia first-degree AV  block.  He is not had any recurrent A. fib that he is aware of.  12/25/2020  [name] is seen today for an acute visit regarding left arm pain.  There is no associated chest pain however he had primarily left arm pain and weakness prior to his bypass surgery in 2006.  His symptoms were significant enough that he went ahead and called EMS.  An EKG was performed and a rhythm strip which captured frequent PVCs.  I personally reviewed that today.  His EKG in the office however shows no ectopy.  In fact there is a sinus rhythm with first-degree AV block and LVH by voltage at 67, unchanged from prior EKG.  Blood pressures well controlled today.  He said he eventually took nitroglycerin and his symptoms eased off however the prescription is quite old and barely made any burning under his tongue.  He denies any worsening symptoms with exertion or lifting.  His last stress test  was in 2016 which was negative for ischemia  04/05/2021  [name] returns today for follow-up.  Overall he seems to be doing well.  He denies any worsening chest pain or shortness of breath.  When I last saw him he was having left arm pain.  We did do a Myoview stress test which was negative for ischemia, back in December 2022.  Since then he has not done any worse.  Blood pressure is well controlled today.  EKG shows sinus rhythm.  06/27/2022  [name] is seen today for preoperative clearance.  Overall he says he is doing well.  He saw Azalee Course, PA-C at the end of last year.  An echo was performed which showed low normal EF 50 to 55% and some lower extremity edema.  He was started on Lasix for this and has had significant improvement.  The etiology of his mild reduction in LV function is not clear but might be related to PVCs.  He has had some PAF but appears to be in sinus with PVCs today.  Blood pressure is well-controlled.  He denies any chest pain or shortness of breath.  He is scheduled to have an upcoming EGD.  It would be okay  to hold his Eliquis for 2 days prior to the procedure and restart after.   Past Medical History:  Diagnosis Date   BPH (benign prostatic hyperplasia)    CAD (coronary artery disease) of artery bypass graft 02/04/2004   Fatty liver    GERD (gastroesophageal reflux disease)    Gilbert's syndrome    Hiatal hernia    History of nuclear stress test 11/04/2007   bruce myoview; normal pattern of persuion in all regions; post-stress EF 70%; low risk    HTN (hypertension)    Neuropathy    PAF (paroxysmal atrial fibrillation) (HCC)    PVD (peripheral vascular disease) (HCC)    0-49% R & L ICA stenosis (2013)    Renal artery aneurysm (HCC)    S/P CABG (coronary artery bypass graft) 02/04/2004   Schatzki's ring    non critical   Throat cancer (HCC)    Thrombocytopenia (HCC)     Past Surgical History:  Procedure Laterality Date   CARDIOVERSION N/A 01/14/2018   Procedure: CARDIOVERSION;  Surgeon: Parke Poisson, MD;  Location: [facility] ENDOSCOPY;  Service: Cardiovascular;  Laterality: N/A;   CATARACT EXTRACTION  2011   COLONOSCOPY  2006   Dr. Houston Siren pancolonic diverticula   COLONOSCOPY  01/07/2012   Dr. Midge Minium normal rectum, pancolonic diverticulosis, hyperplastic polyp   COLONOSCOPY N/A 03/20/2015   WUJ:[id number] diverticulosis   CORONARY ARTERY BYPASS GRAFT  04/2004   LIMA to diagonal, SVG to ramus intermedius, SVG to PDA; placed stent for crossed coronary arteries (Dr. Donata Clay)   ESOPHAGOGASTRODUODENOSCOPY  2006   Dr. Reita Chard Schatzki's ring, s/p 58-F Maloney dilation   ESOPHAGOGASTRODUODENOSCOPY  01/07/2012   Dr. Chauncey Cruel ring, hiatal hernia, fundic gland polyp   ESOPHAGOGASTRODUODENOSCOPY N/A 03/20/2015   FAO:ZHYQM 1 varices/HH Gastric polpy s/p bx   ESOPHAGOGASTRODUODENOSCOPY N/A 07/28/2018   Dr. Jena Gauss: 3 short columns of grade 1 esophageal varices.  Esophageal dilation performed for dysphagia.  Multiple gastric polyps previously proved benign.    MALONEY DILATION N/A 07/28/2018   Procedure: Elease Hashimoto DILATION;  Surgeon: Corbin Ade, MD;  Location: [facility];  Service: Endoscopy;  Laterality: N/A;   rotater cuff     repair   TONSILLECTOMY      FAMHx:  Family History  Problem Relation Age of Onset   Heart attack Mother 78       deceased   Heart attack Father 72       deceased   Heart disease Brother        x2   Diabetes Brother        x3   Colon cancer Neg Hx     SOCHx:   reports that he quit smoking about 18 years ago. His smoking use included cigarettes. He has a 40.00 pack-year smoking history. He has never used smokeless tobacco. He reports current alcohol use. He reports that he does not use drugs.  ALLERGIES:  Allergies  Allergen Reactions   Antihistamines, Chlorpheniramine-Type Rash    Patient states he cant take large doses of antihistamines also causes prostate problems    ROS: Pertinent items noted in HPI and remainder of comprehensive ROS otherwise negative.  HOME MEDS: Current Outpatient Medications  Medication Sig Dispense Refill   allopurinol (ZYLOPRIM) 100 MG tablet Take 100 mg by mouth at bedtime.      amLODipine (NORVASC) 5 MG tablet Take 1 tablet (5 mg total) by mouth daily. 90 tablet 0   apixaban (ELIQUIS) 5 MG TABS tablet Take 1 tablet (5 mg total) by mouth 2 (two) times daily. 180 tablet 3   betamethasone valerate (VALISONE) 0.1 % cream Apply topically 2 (two) times daily.     Ensure Plus (ENSURE PLUS) LIQD Take 237 mLs by mouth.     finasteride (PROSCAR) 5 MG tablet Take 5 mg by mouth at bedtime.      fluticasone (FLONASE) 50 MCG/ACT nasal spray Place 1 spray into both nostrils 2 (two) times a day.     furosemide (LASIX) 40 MG tablet Take 1 tablet (40 mg total) by mouth daily. 1 Tablet Daily. 90 tablet 1   gabapentin (NEURONTIN) 300 MG capsule Take 300 mg by mouth 3 (three) times daily.       Levothyroxine Sodium 112 MCG CAPS Take 112 mcg by mouth daily before breakfast.      losartan  (COZAAR) 100 MG tablet Take 50 mg by mouth daily.     metoprolol tartrate (LOPRESSOR) 25 MG tablet Take 1 tablet (25 mg total) by mouth 2 (two) times daily. 180 tablet 3   Misc Natural Products (BEET ROOT PO) Take by mouth.     Multiple Vitamins-Minerals (PRESERVISION AREDS 2+MULTI VIT) CAPS Take 1 capsule by mouth 2 (two) times daily.     nitroGLYCERIN (NITROSTAT) 0.4 MG SL tablet Place 1 tablet (0.4 mg total) under the tongue every 5 (five) minutes as needed for chest pain. Max 3 doses 25 tablet 3   RABEprazole (ACIPHEX) 20 MG tablet Take 1 tablet (20 mg total) by mouth 2 (two) times daily. (Patient taking differently: Take 20 mg by mouth in the morning and at bedtime.) 180 tablet 3   sildenafil (VIAGRA) 100 MG tablet Take 100 mg by mouth daily as needed for erectile dysfunction.     tamsulosin (FLOMAX) 0.4 MG CAPS capsule Take 0.8 mg by mouth at bedtime.     vitamin B-12 (CYANOCOBALAMIN) 500 MCG tablet Take 500 mcg by mouth daily.     No current facility-administered medications for this visit.    LABS/IMAGING: No results found for this or any previous visit (from the past 48 hour(s)). No results found.  VITALS: BP 124/60   Pulse 62   Ht 5\' 8"  (1.727 m)   Wt 180 lb 3.2 oz (81.7 kg)  SpO2 98%   BMI 27.40 kg/m   EXAM: General appearance: alert and no distress Neck: no carotid bruit and no JVD Lungs: clear to auscultation bilaterally Heart: regular rate and rhythm, S1, S2 normal, no murmur, click, rub or gallop Abdomen: soft, non-tender; bowel sounds normal; no masses,  no organomegaly Extremities: extremities normal, atraumatic, no cyanosis or edema Pulses: 2+ and symmetric Skin: Skin color, texture, turgor normal. No rashes or lesions Neurologic: Grossly normal Psych: Mood, affect normal  EKG: Sinus/low atrial rhythm with PVC's, LVH with repol abnormality - personally reviewed  ASSESSMENT: Left arm pain -low risk Myoview stress test (01/2021) Frequent PVCs Coronary  artery disease status post three-vessel CABG in 2006 (LIMA to diagonal, SVG to PDA and SVG to ramus intermedius) Aortic murmur Negative Myoview stress test in 2016  Hypertension Mild carotid artery disease Mild dyspnea with marked exertion PAF-on Eliquis Leg pain and weakness with walking Renal Artery aneurysm NAFLD ?vocal cord tumor, s/p excision and radiation therapy  PLAN: 1.   [name] is at acceptable risk for upcoming endoscopy.  He can hold his Eliquis 2 days prior to the procedure.  He had a low normal EF on his last echo and was placed on Lasix with improvement in his edema.  I wonder if his A-fib or perhaps PVCs are contributing to some degree of cardiomyopathy.  Will need to continue to monitor that and adjust medications accordingly.  For now he has no more than NYHA class I symptoms.  Plan follow-up in 6 months or sooner as necessary.  Chrystie Nose, MD, [facility], FACP  [place]  [facility]  Medical Director of the Advanced Lipid Disorders &  Cardiovascular Risk Reduction Clinic Attending Cardiologist  Direct Dial: [phone]  Fax: [phone]  Website:  [email or web address] Nicely Fady Stamps 06/27/2022, 1:00 PM

## 2022-06-27 NOTE — Patient Instructions (Signed)
Medication Instructions:  Your physician recommends that you continue on your current medications as directed. Please refer to the Current Medication list given to you today.  *If you need a refill on your cardiac medications before your next appointment, please call your pharmacy*   Lab Work: None If you have labs (blood work) drawn today and your tests are completely normal, you will receive your results only by: MyChart Message (if you have MyChart) OR A paper copy in the mail If you have any lab test that is abnormal or we need to change your treatment, we will call you to review the results.   Testing/Procedures: None   Follow-Up: At [facility], you and your health needs are our priority.  As part of our continuing mission to provide you with exceptional heart care, we have created designated Provider Care Teams.  These Care Teams include your primary Cardiologist (physician) and Advanced Practice Providers (APPs -  Physician Assistants and Nurse Practitioners) who all work together to provide you with the care you need, when you need it.  We recommend signing up for the patient portal called "MyChart".  Sign up information is provided on this After Visit Summary.  MyChart is used to connect with patients for Virtual Visits (Telemedicine).  Patients are able to view lab/test results, encounter notes, upcoming appointments, etc.  Non-urgent messages can be sent to your provider as well.   To learn more about what you can do with MyChart, go to [email or web address].    Your next appointment:   6 month(s)  Provider:   Zoila Shutter, MD    Other Instructions

## 2022-07-01 NOTE — Telephone Encounter (Signed)
Pt seen cardiology on 06/27/22. Please advise. Thank you

## 2022-07-03 ENCOUNTER — Other Ambulatory Visit: Payer: Self-pay | Admitting: Internal Medicine

## 2022-07-03 NOTE — Telephone Encounter (Signed)
*  STAT* If patient is at the pharmacy, call can be transferred to refill team.   1. Which medications need to be refilled? (please list name of each medication and dose if known)   gabapentin (NEURONTIN) 300 MG capsule   2. Which pharmacy/location (including street and city if local pharmacy) is medication to be sent to?  [facility] [address], [place] - [address]   3. Do they need a 30 day or 90 day supply?   30 day  Patient stated he is completely out of this medication.

## 2022-07-03 NOTE — Telephone Encounter (Signed)
Ok to schedule EGD/ED with possible esophageal variceal banding.  Hold eliquis 48 hours ASA 3  PLEASE MAKE SURE HIS [facility] DATES ARE STILL VALID FOR WHATEVER PROCEDURE DATE IS CHOSEN

## 2022-07-04 NOTE — Telephone Encounter (Signed)
LMTRC

## 2022-07-08 ENCOUNTER — Encounter: Payer: Self-pay | Admitting: *Deleted

## 2022-07-08 NOTE — Telephone Encounter (Signed)
Pt has been scheduled for 08/20/22. Instructions mailed.

## 2022-07-09 ENCOUNTER — Encounter: Payer: Self-pay | Admitting: *Deleted

## 2022-07-18 DIAGNOSIS — C320 Malignant neoplasm of glottis: Secondary | ICD-10-CM | POA: Diagnosis not present

## 2022-08-18 ENCOUNTER — Encounter (HOSPITAL_COMMUNITY)
Admission: RE | Admit: 2022-08-18 | Discharge: 2022-08-18 | Disposition: A | Discharge status: Home / Self Care | Payer: No Typology Code available for payment source | Source: Ambulatory Visit | Attending: Internal Medicine | Admitting: Internal Medicine

## 2022-08-18 ENCOUNTER — Encounter (HOSPITAL_COMMUNITY): Payer: Self-pay

## 2022-08-18 ENCOUNTER — Other Ambulatory Visit: Payer: Self-pay

## 2022-08-18 HISTORY — DX: Unspecified hearing loss, unspecified ear: H91.90

## 2022-08-20 ENCOUNTER — Encounter (HOSPITAL_COMMUNITY)
Admission: RE | Disposition: A | Discharge status: Home / Self Care | Payer: Self-pay | Source: Home / Self Care | Attending: Internal Medicine

## 2022-08-20 ENCOUNTER — Ambulatory Visit (HOSPITAL_COMMUNITY): Payer: No Typology Code available for payment source | Admitting: Anesthesiology

## 2022-08-20 ENCOUNTER — Encounter (HOSPITAL_COMMUNITY): Payer: Self-pay | Admitting: Internal Medicine

## 2022-08-20 ENCOUNTER — Ambulatory Visit (HOSPITAL_COMMUNITY)
Admission: RE | Admit: 2022-08-20 | Discharge: 2022-08-20 | Disposition: A | Discharge status: Home / Self Care | Payer: No Typology Code available for payment source | Attending: Internal Medicine | Admitting: Internal Medicine

## 2022-08-20 DIAGNOSIS — K449 Diaphragmatic hernia without obstruction or gangrene: Secondary | ICD-10-CM | POA: Insufficient documentation

## 2022-08-20 DIAGNOSIS — I10 Essential (primary) hypertension: Secondary | ICD-10-CM | POA: Diagnosis not present

## 2022-08-20 DIAGNOSIS — Z87891 Personal history of nicotine dependence: Secondary | ICD-10-CM | POA: Diagnosis not present

## 2022-08-20 DIAGNOSIS — Z8719 Personal history of other diseases of the digestive system: Secondary | ICD-10-CM | POA: Diagnosis not present

## 2022-08-20 DIAGNOSIS — K317 Polyp of stomach and duodenum: Secondary | ICD-10-CM | POA: Diagnosis not present

## 2022-08-20 DIAGNOSIS — R1310 Dysphagia, unspecified: Secondary | ICD-10-CM | POA: Insufficient documentation

## 2022-08-20 DIAGNOSIS — I2510 Atherosclerotic heart disease of native coronary artery without angina pectoris: Secondary | ICD-10-CM | POA: Diagnosis not present

## 2022-08-20 DIAGNOSIS — K7460 Unspecified cirrhosis of liver: Secondary | ICD-10-CM

## 2022-08-20 HISTORY — PX: ESOPHAGEAL BANDING: SHX5518

## 2022-08-20 HISTORY — PX: ESOPHAGOGASTRODUODENOSCOPY (EGD) WITH PROPOFOL: SHX5813

## 2022-08-20 HISTORY — PX: MALONEY DILATION: SHX5535

## 2022-08-20 SURGERY — ESOPHAGOGASTRODUODENOSCOPY (EGD) WITH PROPOFOL
Anesthesia: General

## 2022-08-20 MED ORDER — PROPOFOL 10 MG/ML IV BOLUS
INTRAVENOUS | Status: DC | PRN
  Administered 2022-08-20 (×2): 10 mg via INTRAVENOUS
  Administered 2022-08-20: 20 mg via INTRAVENOUS
  Administered 2022-08-20: 40 mg via INTRAVENOUS

## 2022-08-20 MED ORDER — GLYCOPYRROLATE PF 0.2 MG/ML IJ SOSY
PREFILLED_SYRINGE | INTRAMUSCULAR | Status: DC | PRN
  Administered 2022-08-20: .2 mg via INTRAVENOUS

## 2022-08-20 MED ORDER — PHENYLEPHRINE HCL (PRESSORS) 10 MG/ML IV SOLN
INTRAVENOUS | Status: DC | PRN
  Administered 2022-08-20: 80 ug via INTRAVENOUS

## 2022-08-20 MED ORDER — PROPOFOL 500 MG/50ML IV EMUL
INTRAVENOUS | Status: DC | PRN
  Administered 2022-08-20: 100 ug/kg/min via INTRAVENOUS

## 2022-08-20 MED ORDER — LACTATED RINGERS IV SOLN: INTRAVENOUS | Status: DC

## 2022-08-20 MED ORDER — STERILE WATER FOR IRRIGATION IR SOLN
Status: DC | PRN
  Administered 2022-08-20: 100 mL

## 2022-08-20 MED ORDER — PROPOFOL 500 MG/50ML IV EMUL: INTRAVENOUS | Status: DC | PRN

## 2022-08-20 MED ORDER — LIDOCAINE HCL (CARDIAC) PF 100 MG/5ML IV SOSY
PREFILLED_SYRINGE | INTRAVENOUS | Status: DC | PRN
  Administered 2022-08-20: 100 mg via INTRATRACHEAL

## 2022-08-20 NOTE — Transfer of Care (Signed)
Immediate Anesthesia Transfer of Care Note  Patient: [name]  Procedure(s) Performed: ESOPHAGOGASTRODUODENOSCOPY (EGD) WITH PROPOFOL MALONEY DILATION ESOPHAGEAL BANDING  Patient Location: PACU  Anesthesia Type:General  Level of Consciousness: sedated  Airway & Oxygen Therapy: Patient Spontanous Breathing  Post-op Assessment: Report given to RN and Post -op Vital signs reviewed and stable  Post vital signs: Reviewed and stable  Last Vitals:  Vitals Value Taken Time  BP 127/49 08/20/22 1128  Temp 36.3 C 08/20/22 1128  Pulse 78 08/20/22 1128  Resp 15 08/20/22 1128  SpO2 16 % 08/20/22 1128    Last Pain:  Vitals:   08/20/22 1128  TempSrc: Axillary  PainSc: Asleep         Complications: No notable events documented.

## 2022-08-20 NOTE — H&P (Signed)
@LOGO @   Primary Care Physician:  Siy-Hian, Whitman Hero, MD Primary Gastroenterologist:  Dr. Jena Gauss  Pre-Procedure History & Physical: HPI:  [name] is a 89 y.o. male here for   Further evaluation management of recurrent esophageal dysphagia since he was last dilated he underwent radiation therapy for throat cancer.  History of dysphagia worked up with EGD a few years ago normal-appearing esophagus.  He responded nicely to passage of a 56 [place] Maloney dilator.  History of Elita Boone.  Has a history of grade 1 esophageal varices.  No history of bleeding.  Past Medical History:  Diagnosis Date   BPH (benign prostatic hyperplasia)    CAD (coronary artery disease) of artery bypass graft 02/04/2004   Fatty liver    GERD (gastroesophageal reflux disease)    Gilbert's syndrome    Hiatal hernia    History of nuclear stress test 11/04/2007   bruce myoview; normal pattern of persuion in all regions; post-stress EF 70%; low risk    HOH (hard of hearing)    HTN (hypertension)    Neuropathy    PAF (paroxysmal atrial fibrillation) (HCC)    PVD (peripheral vascular disease) (HCC)    0-49% R & L ICA stenosis (2013)    Renal artery aneurysm (HCC)    S/P CABG (coronary artery bypass graft) 02/04/2004   Schatzki's ring    non critical   Throat cancer (HCC)    Thrombocytopenia (HCC)     Past Surgical History:  Procedure Laterality Date   CARDIOVERSION N/A 01/14/2018   Procedure: CARDIOVERSION;  Surgeon: Parke Poisson, MD;  Location: [facility] ENDOSCOPY;  Service: Cardiovascular;  Laterality: N/A;   CATARACT EXTRACTION  2011   COLONOSCOPY  2006   Dr. Houston Siren pancolonic diverticula   COLONOSCOPY  01/07/2012   Dr. Midge Minium normal rectum, pancolonic diverticulosis, hyperplastic polyp   COLONOSCOPY N/A 03/20/2015   [id number] diverticulosis   CORONARY ARTERY BYPASS GRAFT  04/2004   LIMA to diagonal, SVG to ramus intermedius, SVG to PDA; placed stent for crossed coronary  arteries (Dr. Donata Clay)   ESOPHAGOGASTRODUODENOSCOPY  2006   Dr. Reita Chard Schatzki's ring, s/p 58-F Maloney dilation   ESOPHAGOGASTRODUODENOSCOPY  01/07/2012   Dr. Chauncey Cruel ring, hiatal hernia, fundic gland polyp   ESOPHAGOGASTRODUODENOSCOPY N/A 03/20/2015   JXB:[id number] 1 varices/HH Gastric polpy s/p bx   ESOPHAGOGASTRODUODENOSCOPY N/A 07/28/2018   Dr. Jena Gauss: 3 short columns of grade 1 esophageal varices.  Esophageal dilation performed for dysphagia.  Multiple gastric polyps previously proved benign.   MALONEY DILATION N/A 07/28/2018   Procedure: Elease Hashimoto DILATION;  Surgeon: Corbin Ade, MD;  Location: [facility] ENDO SUITE;  Service: Endoscopy;  Laterality: N/A;   rotater cuff Left    repair   TONSILLECTOMY      Prior to Admission medications   Medication Sig Start Date End Date Taking? Authorizing Provider  allopurinol (ZYLOPRIM) 100 MG tablet Take 100 mg by mouth at bedtime.    Yes [provider]  amLODipine (NORVASC) 5 MG tablet Take 1 tablet (5 mg total) by mouth daily. 02/07/22  Yes Hilty, Lisette Abu, MD  finasteride (PROSCAR) 5 MG tablet Take 5 mg by mouth at bedtime.    Yes [provider]  furosemide (LASIX) 40 MG tablet Take 1 tablet (40 mg total) by mouth daily. 1 Tablet Daily. 12/30/21  Yes Hilty, Lisette Abu, MD  gabapentin (NEURONTIN) 300 MG capsule Take 300 mg by mouth 3 (three) times daily.     Yes [provider]  Levothyroxine  Sodium 112 MCG CAPS Take 112 mcg by mouth daily before breakfast.    Yes [provider]  losartan (COZAAR) 100 MG tablet Take 50 mg by mouth daily.   Yes [provider]  metoprolol tartrate (LOPRESSOR) 25 MG tablet Take 1 tablet (25 mg total) by mouth 2 (two) times daily. 11/18/21  Yes Hilty, Lisette Abu, MD  RABEprazole (ACIPHEX) 20 MG tablet Take 1 tablet (20 mg total) by mouth 2 (two) times daily. Patient taking differently: Take 20 mg by mouth in the morning and at bedtime. 09/10/18  Yes Shaquill Iseman,  Gerrit Friends, MD  sildenafil (VIAGRA) 100 MG tablet Take 100 mg by mouth daily as needed for erectile dysfunction.   Yes [provider]  tamsulosin (FLOMAX) 0.4 MG CAPS capsule Take 0.8 mg by mouth at bedtime.   Yes [provider]  apixaban (ELIQUIS) 5 MG TABS tablet Take 1 tablet (5 mg total) by mouth 2 (two) times daily. 01/24/16   Hilty, Lisette Abu, MD  betamethasone valerate (VALISONE) 0.1 % cream Apply topically 2 (two) times daily.    [provider]  Ensure Plus (ENSURE PLUS) LIQD Take 237 mLs by mouth.    [provider]  fluticasone (FLONASE) 50 MCG/ACT nasal spray Place 1 spray into both nostrils 2 (two) times a day.    [provider]  Misc Natural Products (BEET ROOT PO) Take by mouth.    [provider]  Multiple Vitamins-Minerals (PRESERVISION AREDS 2+MULTI VIT) CAPS Take 1 capsule by mouth 2 (two) times daily.    [provider]  nitroGLYCERIN (NITROSTAT) 0.4 MG SL tablet Place 1 tablet (0.4 mg total) under the tongue every 5 (five) minutes as needed for chest pain. Max 3 doses 12/25/20   Hilty, Lisette Abu, MD  vitamin B-12 (CYANOCOBALAMIN) 500 MCG tablet Take 500 mcg by mouth daily.    [provider]    Allergies as of 07/08/2022 - Review Complete 06/27/2022  Allergen Reaction Noted   Antihistamines, chlorpheniramine-type Rash 08/26/2011    Family History  Problem Relation Age of Onset   Heart attack Mother 53       deceased   Heart attack Father 39       deceased   Heart disease Brother        x2   Diabetes Brother        x3   Colon cancer Neg Hx     Social History   Socioeconomic History   Marital status: Married    Spouse name: Not on file   Number of children: Not on file   Years of education: Not on file   Highest education level: Not on file  Occupational History   Not on file  Tobacco Use   Smoking status: Former    Current packs/day: 0.00    Average packs/day: 1 pack/day for 40.0  years (40.0 ttl pk-yrs)    Types: Cigarettes    Start date: 04/07/1964    Quit date: 04/07/2004    Years since quitting: 18.3   Smokeless tobacco: Never  Vaping Use   Vaping status: Never Used  Substance and Sexual Activity   Alcohol use: Yes    Comment: occassional drinker   Drug use: No   Sexual activity: Never  Other Topics Concern   Not on file  Social History Narrative   Not on file   Social Determinants of Health   Financial Resource Strain: Not on file  Food Insecurity: Not on file  Transportation Needs: Not on file  Physical Activity: Not on file  Stress: Not on file  Social Connections: Not on file  Intimate Partner Violence: Not on file    Review of Systems: See HPI, otherwise negative ROS  Physical Exam: BP (!) 170/61   Pulse (!) 50   Temp 97.9 F (36.6 C) (Oral)   Resp 16   Ht 5\' 8"  (1.727 m)   Wt 80.7 kg   SpO2 100%   BMI 27.06 kg/m  General:   Alert,  Well-developed, well-nourished, pleasant and cooperative in NAD Neck:  Supple; no masses or thyromegaly. No significant cervical adenopathy. Lungs:  Clear throughout to auscultation.   No wheezes, crackles, or rhonchi. No acute distress. Heart:  Regular rate and rhythm; no murmurs, clicks, rubs,  or gallops. Abdomen: Non-distended, normal bowel sounds.  Soft and nontender without appreciable mass or hepatosplenomegaly.   Impression/Plan:    89 year old gentleman with a history of Elita Boone cirrhosis and known grade 1 esophageal varices history of throat cancer recently  undergone radiation.  Has had recurrent esophageal dysphagia recently.  I have offered the patient a EGD with esophageal dilation is feasible/appropriate per plan.` The risks, benefits, limitations, alternatives and imponderables have been reviewed with the patient. Potential for esophageal dilation, biopsy, etc. have also been reviewed.  Questions have been answered. All parties agreeable.      Notice: This dictation was prepared with Dragon  dictation along with smaller phrase technology. Any transcriptional errors that result from this process are unintentional and may not be corrected upon review.

## 2022-08-20 NOTE — Anesthesia Postprocedure Evaluation (Signed)
Anesthesia Post Note  Patient: [name]  Procedure(s) Performed: ESOPHAGOGASTRODUODENOSCOPY (EGD) WITH PROPOFOL MALONEY DILATION ESOPHAGEAL BANDING  Patient location during evaluation: Phase II Anesthesia Type: General Level of consciousness: awake and alert and oriented Pain management: pain level controlled Vital Signs Assessment: post-procedure vital signs reviewed and stable Respiratory status: spontaneous breathing, nonlabored ventilation and respiratory function stable Cardiovascular status: blood pressure returned to baseline and stable Postop Assessment: no apparent nausea or vomiting Anesthetic complications: no  No notable events documented.   Last Vitals:  Vitals:   08/20/22 0851 08/20/22 1128  BP: (!) 170/61 (!) 127/49  Pulse: (!) 50 78  Resp: 16 15  Temp: 36.6 C (!) 36.3 C  SpO2: 100% (!) 16%    Last Pain:  Vitals:   08/20/22 1128  TempSrc: Axillary  PainSc: Asleep                 Eriel Doyon C Zonia Caplin

## 2022-08-20 NOTE — Anesthesia Preprocedure Evaluation (Addendum)
Anesthesia Evaluation  Patient identified by MRN, date of birth, ID band Patient awake    Reviewed: Allergy & Precautions, H&P , NPO status , Patient's Chart, lab work & pertinent test results  Airway Mallampati: III  TM Distance: >3 FB Neck ROM: Full    Dental  (+) Edentulous Upper, Edentulous Lower   Pulmonary former smoker   Pulmonary exam normal breath sounds clear to auscultation       Cardiovascular Exercise Tolerance: Good hypertension, Pt. on medications + CAD, + CABG and + Peripheral Vascular Disease  + dysrhythmias Atrial Fibrillation  Rhythm:Irregular Rate:Normal  Chrystie Nose, MD 01/03/2021  6:02 PM EST   Low risk stress test - normal LVEF   Dr Rexene Edison     Neuro/Psych  Neuromuscular disease  negative psych ROS   GI/Hepatic hiatal hernia,GERD (dysphagia)  Medicated and Controlled,,(+) Cirrhosis   Esophageal Varices      Endo/Other  negative endocrine ROS    Renal/GU Renal disease (renal artery aneurysm)  negative genitourinary   Musculoskeletal negative musculoskeletal ROS (+)    Abdominal   Peds negative pediatric ROS (+)  Hematology  (+) Blood dyscrasia, anemia   Anesthesia Other Findings   Reproductive/Obstetrics negative OB ROS                             Anesthesia Physical Anesthesia Plan  ASA: 3  Anesthesia Plan: General   Post-op Pain Management: Minimal or no pain anticipated   Induction: Intravenous  PONV Risk Score and Plan: Propofol infusion  Airway Management Planned: Nasal Cannula and Natural Airway  Additional Equipment:   Intra-op Plan:   Post-operative Plan:   Informed Consent: I have reviewed the patients History and Physical, chart, labs and discussed the procedure including the risks, benefits and alternatives for the proposed anesthesia with the patient or authorized representative who has indicated his/her understanding and acceptance.      Dental advisory given  Plan Discussed with: CRNA and Surgeon  Anesthesia Plan Comments:        Anesthesia Quick Evaluation

## 2022-08-20 NOTE — Op Note (Signed)
[facility] Patient Name: [name] Procedure Date: 08/20/2022 10:55 AM MRN: [id number] Date of Birth: 18-Oct-1933 Attending MD: Gennette Pac , MD, [id number] CSN: [id number] Age: 89 Admit Type: Outpatient Procedure:                Upper GI endoscopy Indications:              Dysphagia Providers:                Gennette Pac, MD, Buel Ream. Museum/gallery exhibitions officer, Charity fundraiser,                            Judeth Cornfield. Jessee Avers, Technician Referring MD:              Medicines:                Propofol per Anesthesia Complications:            No immediate complications. Estimated Blood Loss:     Estimated blood loss: none. Procedure:                Pre-Anesthesia Assessment:                           - Prior to the procedure, a History and Physical                            was performed, and patient medications and                            allergies were reviewed. The patient's tolerance of                            previous anesthesia was also reviewed. The risks                            and benefits of the procedure and the sedation                            options and risks were discussed with the patient.                            All questions were answered, and informed consent                            was obtained. Prior Anticoagulants: The patient                            last took Eliquis (apixaban) 3 days prior to the                            procedure. ASA Grade Assessment: III - A patient                            with severe systemic disease. After reviewing the  risks and benefits, the patient was deemed in                            satisfactory condition to undergo the procedure.                           After obtaining informed consent, the endoscope was                            passed under direct vision. Throughout the                            procedure, the patient's blood pressure, pulse, and                             oxygen saturations were monitored continuously. The                            [id number]-H190 ([id number]) scope was introduced through the                            mouth, and advanced to the second part of duodenum.                            The upper GI endoscopy was accomplished without                            difficulty. The patient tolerated the procedure                            well. Scope In: 11:16:02 AM Scope Out: 11:22:55 AM Total Procedure Duration: 0 hours 6 minutes 53 seconds  Findings:      The examined esophagus was normal. Patent esophagus throughout its       course. No discernible esophageal varices seen today. Stomach empty.      A small hiatal hernia was present. Gastric mucosa appeared normal aside       from previously noted and biopsied gastric polyps which appeared       stable.. Patent pylorus.      The duodenal bulb and second portion of the duodenum were normal. The       scope was withdrawn. Dilation was performed with a Maloney dilator with       mild resistance at 56 Fr. The dilation site was examined following       endoscope reinsertion and showed no change. Estimated blood loss: none. Impression:               - Normal esophagus. Dilated. No discernible varices                            found today                           - Small hiatal hernia. Gastric polyps.                           -  Normal duodenal bulb and second portion of the                            duodenum.                           - No specimens collected. Moderate Sedation:      Moderate (conscious) sedation was personally administered by an       anesthesia professional. The following parameters were monitored: oxygen       saturation, heart rate, blood pressure, respiratory rate, EKG, adequacy       of pulmonary ventilation, and response to care. Recommendation:           - Patient has a contact number available for                            emergencies. The signs and symptoms of  potential                            delayed complications were discussed with the                            patient. Return to normal activities tomorrow.                            Written discharge instructions were provided to the                            patient.                           - Advance diet as tolerated.                           - Continue present medications.                           - Return to my office in 6 months. Procedure Code(s):        --- Professional ---                           [id number], Esophagogastroduodenoscopy, flexible,                            transoral; diagnostic, including collection of                            specimen(s) by brushing or washing, when performed                            (separate procedure)                           43450, Dilation of esophagus, by unguided sound or                            bougie, single or  multiple passes Diagnosis Code(s):        --- Professional ---                           K44.9, Diaphragmatic hernia without obstruction or                            gangrene                           R13.10, Dysphagia, unspecified CPT copyright 2022 [employer]. All rights reserved. The codes documented in this report are preliminary and upon coder review may  be revised to meet current compliance requirements. Gerrit Friends. Riannah Stagner, MD Gennette Pac, MD 08/20/2022 11:32:22 AM This report has been signed electronically. Number of Addenda: 0

## 2022-08-20 NOTE — Discharge Instructions (Signed)
EGD Discharge instructions Please read the instructions outlined below and refer to this sheet in the next few weeks. These discharge instructions provide you with general information on caring for yourself after you leave the hospital. Your doctor may also give you specific instructions. While your treatment has been planned according to the most current medical practices available, unavoidable complications occasionally occur. If you have any problems or questions after discharge, please call your doctor. ACTIVITY You may resume your regular activity but move at a slower pace for the next 24 hours.  Take frequent rest periods for the next 24 hours.  Walking will help expel (get rid of) the air and reduce the bloated feeling in your abdomen.  No driving for 24 hours (because of the anesthesia (medicine) used during the test).  You may shower.  Do not sign any important legal documents or operate any machinery for 24 hours (because of the anesthesia used during the test).  NUTRITION Drink plenty of fluids.  You may resume your normal diet.  Begin with a light meal and progress to your normal diet.  Avoid alcoholic beverages for 24 hours or as instructed by your caregiver.  MEDICATIONS You may resume your normal medications unless your caregiver tells you otherwise.  WHAT YOU CAN EXPECT TODAY You may experience abdominal discomfort such as a feeling of fullness or "gas" pains.  FOLLOW-UP Your doctor will discuss the results of your test with you.  SEEK IMMEDIATE MEDICAL ATTENTION IF ANY OF THE FOLLOWING OCCUR: Excessive nausea (feeling sick to your stomach) and/or vomiting.  Severe abdominal pain and distention (swelling).  Trouble swallowing.  Temperature over 101 F (37.8 C).  Rectal bleeding or vomiting of blood.      Your esophagus was dilated today.  I found no esophageal varices today.    Resume Eliquis today.  Office follow-up appointment in 6 months   at patient request,  I called Octave Montrose at [phone] -  unable to reach.

## 2022-08-26 ENCOUNTER — Encounter (HOSPITAL_COMMUNITY): Payer: Self-pay | Admitting: Internal Medicine

## 2022-09-12 ENCOUNTER — Other Ambulatory Visit: Payer: Self-pay | Admitting: Internal Medicine

## 2022-09-15 ENCOUNTER — Other Ambulatory Visit: Payer: Self-pay

## 2022-09-15 DIAGNOSIS — K7460 Unspecified cirrhosis of liver: Secondary | ICD-10-CM

## 2022-09-23 DIAGNOSIS — K7460 Unspecified cirrhosis of liver: Secondary | ICD-10-CM | POA: Diagnosis not present

## 2022-10-22 DIAGNOSIS — E782 Mixed hyperlipidemia: Secondary | ICD-10-CM | POA: Diagnosis not present

## 2022-10-22 DIAGNOSIS — K219 Gastro-esophageal reflux disease without esophagitis: Secondary | ICD-10-CM | POA: Diagnosis not present

## 2022-10-22 DIAGNOSIS — I480 Paroxysmal atrial fibrillation: Secondary | ICD-10-CM | POA: Diagnosis not present

## 2022-10-22 DIAGNOSIS — E063 Autoimmune thyroiditis: Secondary | ICD-10-CM | POA: Diagnosis not present

## 2022-10-22 DIAGNOSIS — Z0001 Encounter for general adult medical examination with abnormal findings: Secondary | ICD-10-CM | POA: Diagnosis not present

## 2022-10-22 DIAGNOSIS — I2510 Atherosclerotic heart disease of native coronary artery without angina pectoris: Secondary | ICD-10-CM | POA: Diagnosis not present

## 2022-12-08 ENCOUNTER — Ambulatory Visit: Payer: No Typology Code available for payment source | Admitting: Gastroenterology

## 2022-12-08 NOTE — Progress Notes (Deleted)
GI Office Note    Referring Provider: Donnita Falls* Primary Care Physician:  Viann Shove, MD  Primary Gastroenterologist: Roetta Sessions, MD   Chief Complaint   No chief complaint on file.   History of Present Illness   [name] is a 89 y.o. male presenting today for follow up. Last seen 06/2022. History of MASH cirrhosis, dysphagia. MELD Na 9 in 06/2022.     RUQ U/S 06/2022: IMPRESSION: 1. No cholelithiasis or sonographic evidence for acute cholecystitis. 2. Common bile duct is prominent measuring 7.8 mm. Recommend correlation with LFTs. If there is concern for biliary obstruction, MRCP or ERCP may be considered. 3. Increased hepatic parenchymal echogenicity suggestive of steatosis and/or hepatocellular disease.  EGD 08/2022: -normal esophagus s/p dilation -no discernible esophageal varices -small hiatal hernia -gastric polyps  Colonoscopy 2017: -pancolonic diverticulosis  Medications   Current Outpatient Medications  Medication Sig Dispense Refill   allopurinol (ZYLOPRIM) 100 MG tablet Take 100 mg by mouth at bedtime.      amLODipine (NORVASC) 5 MG tablet Take 1 tablet (5 mg total) by mouth daily. 90 tablet 0   apixaban (ELIQUIS) 5 MG TABS tablet Take 1 tablet (5 mg total) by mouth 2 (two) times daily. 180 tablet 3   betamethasone valerate (VALISONE) 0.1 % cream Apply topically 2 (two) times daily.     Ensure Plus (ENSURE PLUS) LIQD Take 237 mLs by mouth.     finasteride (PROSCAR) 5 MG tablet Take 5 mg by mouth at bedtime.      fluticasone (FLONASE) 50 MCG/ACT nasal spray Place 1 spray into both nostrils 2 (two) times a day.     furosemide (LASIX) 40 MG tablet Take 1 tablet by mouth once daily 90 tablet 2   gabapentin (NEURONTIN) 300 MG capsule Take 300 mg by mouth 3 (three) times daily.       Levothyroxine Sodium 112 MCG CAPS Take 112 mcg by mouth daily before breakfast.      losartan (COZAAR) 100 MG tablet Take 50 mg by mouth  daily.     metoprolol tartrate (LOPRESSOR) 25 MG tablet Take 1 tablet (25 mg total) by mouth 2 (two) times daily. 180 tablet 3   Misc Natural Products (BEET ROOT PO) Take by mouth.     Multiple Vitamins-Minerals (PRESERVISION AREDS 2+MULTI VIT) CAPS Take 1 capsule by mouth 2 (two) times daily.     nitroGLYCERIN (NITROSTAT) 0.4 MG SL tablet Place 1 tablet (0.4 mg total) under the tongue every 5 (five) minutes as needed for chest pain. Max 3 doses 25 tablet 3   RABEprazole (ACIPHEX) 20 MG tablet Take 1 tablet (20 mg total) by mouth 2 (two) times daily. (Patient taking differently: Take 20 mg by mouth in the morning and at bedtime.) 180 tablet 3   sildenafil (VIAGRA) 100 MG tablet Take 100 mg by mouth daily as needed for erectile dysfunction.     tamsulosin (FLOMAX) 0.4 MG CAPS capsule Take 0.8 mg by mouth at bedtime.     vitamin B-12 (CYANOCOBALAMIN) 500 MCG tablet Take 500 mcg by mouth daily.     No current facility-administered medications for this visit.    Allergies   Allergies as of 12/08/2022 - Review Complete 08/20/2022  Allergen Reaction Noted   Antihistamines, chlorpheniramine-type Rash 08/26/2011     Past Medical History   Past Medical History:  Diagnosis Date   BPH (benign prostatic hyperplasia)    CAD (coronary artery disease) of artery bypass graft  02/04/2004   Fatty liver    GERD (gastroesophageal reflux disease)    Gilbert's syndrome    Hiatal hernia    History of nuclear stress test 11/04/2007   bruce myoview; normal pattern of persuion in all regions; post-stress EF 70%; low risk    HOH (hard of hearing)    HTN (hypertension)    Neuropathy    PAF (paroxysmal atrial fibrillation) (HCC)    PVD (peripheral vascular disease) (HCC)    0-49% R & L ICA stenosis (2013)    Renal artery aneurysm (HCC)    S/P CABG (coronary artery bypass graft) 02/04/2004   Schatzki's ring    non critical   Throat cancer (HCC)    Thrombocytopenia (HCC)     Past Surgical History    Past Surgical History:  Procedure Laterality Date   CARDIOVERSION N/A 01/14/2018   Procedure: CARDIOVERSION;  Surgeon: Parke Poisson, MD;  Location: [facility] ENDOSCOPY;  Service: Cardiovascular;  Laterality: N/A;   CATARACT EXTRACTION  2011   COLONOSCOPY  2006   Dr. Houston Siren pancolonic diverticula   COLONOSCOPY  01/07/2012   Dr. Midge Minium normal rectum, pancolonic diverticulosis, hyperplastic polyp   COLONOSCOPY N/A 03/20/2015   NWG:[id number] diverticulosis   CORONARY ARTERY BYPASS GRAFT  04/2004   LIMA to diagonal, SVG to ramus intermedius, SVG to PDA; placed stent for crossed coronary arteries (Dr. Donata Clay)   ESOPHAGEAL BANDING N/A 08/20/2022   Procedure: ESOPHAGEAL BANDING;  Surgeon: Corbin Ade, MD;  Location: [facility] ENDO SU[facility];  Service: Endoscopy;  Laterality: N/A;   ESOPHAGOGASTRODUODENOSCOPY  2006   Dr. Reita Chard Schatzki's ring, s/p 58-F Maloney dilation   ESOPHAGOGASTRODUODENOSCOPY  01/07/2012   Dr. Chauncey Cruel ring, hiatal hernia, fundic gland polyp   ESOPHAGOGASTRODUODENOSCOPY N/A 03/20/2015   HQI:ONGEX 1 varices/HH Gastric polpy s/p bx   ESOPHAGOGASTRODUODENOSCOPY N/A 07/28/2018   Dr. Jena Gauss: 3 short columns of grade 1 esophageal varices.  Esophageal dilation performed for dysphagia.  Multiple gastric polyps previously proved benign.   ESOPHAGOGASTRODUODENOSCOPY (EGD) WITH PROPOFOL N/A 08/20/2022   Procedure: ESOPHAGOGASTRODUODENOSCOPY (EGD) WITH PROPOFOL;  Surgeon: Corbin Ade, MD;  Location: [facility] ENDO SU[facility];  Service: Endoscopy;  Laterality: N/A;  11:00 am, asa 3   MALONEY DILATION N/A 07/28/2018   Procedure: MALONEY DILATION;  Surgeon: Corbin Ade, MD;  Location: [facility] ENDO SUITE;  Service: Endoscopy;  Laterality: N/A;   MALONEY DILATION N/A 08/20/2022   Procedure: Elease Hashimoto DILATION;  Surgeon: Corbin Ade, MD;  Location: [facility] ENDO [facility]ITE;  Service: Endoscopy;  Laterality: N/A;   rotater cuff Left    repair   TONSILLECTOMY      Past  Family History   Family History  Problem Relation Age of Onset   Heart attack Mother 59       deceased   Heart attack Father 19       deceased   Heart disease Brother        x2   Diabetes Brother        x3   Colon cancer Neg Hx     Past Social History   Social History   Socioeconomic History   Marital status: Married    Spouse name: Not on file   Number of children: Not on file   Years of education: Not on file   Highest education level: Not on file  Occupational History   Not on file  Tobacco Use   Smoking status: Former    Current packs/day: 0.00    Average packs/day: 1  pack/day for 40.0 years (40.0 ttl pk-yrs)    Types: Cigarettes    Start date: 04/07/1964    Quit date: 04/07/2004    Years since quitting: 18.6   Smokeless tobacco: Never  Vaping Use   Vaping status: Never Used  Substance and Sexual Activity   Alcohol use: Yes    Comment: occassional drinker   Drug use: No   Sexual activity: Never  Other Topics Concern   Not on file  Social History Narrative   Not on file   Social Determinants of Health   Financial Resource Strain: Not on file  Food Insecurity: Not on file  Transportation Needs: Not on file  Physical Activity: Not on file  Stress: Not on file  Social Connections: Not on file  Intimate Partner Violence: Not on file    Review of Systems   General: Negative for anorexia, weight loss, fever, chills, fatigue, weakness. ENT: Negative for hoarseness, difficulty swallowing , nasal congestion. CV: Negative for chest pain, angina, palpitations, dyspnea on exertion, peripheral edema.  Respiratory: Negative for dyspnea at rest, dyspnea on exertion, cough, sputum, wheezing.  GI: See history of present illness. GU:  Negative for dysuria, hematuria, urinary incontinence, urinary frequency, nocturnal urination.  Endo: Negative for unusual weight change.     Physical Exam   There were no vitals taken for this visit.   General: Well-nourished,  well-developed in no acute distress.  Eyes: No icterus. Mouth: Oropharyngeal mucosa moist and pink , no lesions erythema or exudate. Lungs: Clear to auscultation bilaterally.  Heart: Regular rate and rhythm, no murmurs rubs or gallops.  Abdomen: Bowel sounds are normal, nontender, nondistended, no hepatosplenomegaly or masses,  no abdominal bruits or hernia , no rebound or guarding.  Rectal: ***  Extremities: No lower extremity edema. No clubbing or deformities. Neuro: Alert and oriented x 4   Skin: Warm and dry, no jaundice.   Psych: Alert and cooperative, normal mood and affect.  Labs   Lab Results  Component Value Date   NA 141 06/04/2022   CL 103 06/04/2022   K 4.3 06/04/2022   CO2 22 06/04/2022   BUN 18 06/04/2022   CREATININE 1.23 06/04/2022   EGFR 57 (L) 06/04/2022   CALCIUM 9.4 06/04/2022   ALBUMIN 4.0 09/23/2022   GLUCOSE 106 (H) 06/04/2022   Lab Results  Component Value Date   ALT 7 09/23/2022   AST 15 09/23/2022   ALKPHOS 110 09/23/2022   BILITOT 0.5 09/23/2022   Lab Results  Component Value Date   WBC 4.2 06/04/2022   HGB 10.2 (L) 06/04/2022   HCT 30.3 (L) 06/04/2022   MCV 92 06/04/2022   PLT 152 06/04/2022   Lab Results  Component Value Date   INR 1.1 06/04/2022   INR 1.0 07/07/2017   INR 1.0 12/11/2016    Imaging Studies   No results found.  Assessment       PLAN   ***   Leanna Battles. Melvyn Neth, MHS, PA-C [facility]

## 2022-12-09 ENCOUNTER — Telehealth: Payer: Self-pay | Admitting: Internal Medicine

## 2022-12-09 NOTE — Telephone Encounter (Signed)
Patient cancelled his OV for 12/08/2022 because the [place] auth had expired in August. [facility] will [facility] prior to rescheduling with [place] if he is wanting the [place] to pay for his visits. I told him that the [place] auths aren't good for 6 months at a time, that changes were made recently and they are good for 60-90 days. Please let him know that he will need to [facility] with [facility] to get a new auth.

## 2022-12-26 ENCOUNTER — Other Ambulatory Visit: Payer: Self-pay | Admitting: Internal Medicine

## 2022-12-30 ENCOUNTER — Ambulatory Visit: Payer: Medicare Other | Attending: Internal Medicine | Admitting: Internal Medicine

## 2022-12-30 ENCOUNTER — Encounter: Payer: Self-pay | Admitting: Internal Medicine

## 2022-12-30 VITALS — BP 138/62 | HR 56 | Ht 68.0 in | Wt 189.0 lb

## 2022-12-30 DIAGNOSIS — I480 Paroxysmal atrial fibrillation: Secondary | ICD-10-CM | POA: Diagnosis not present

## 2022-12-30 DIAGNOSIS — Z951 Presence of aortocoronary bypass graft: Secondary | ICD-10-CM | POA: Diagnosis not present

## 2022-12-30 DIAGNOSIS — I10 Essential (primary) hypertension: Secondary | ICD-10-CM

## 2022-12-30 DIAGNOSIS — I350 Nonrheumatic aortic (valve) stenosis: Secondary | ICD-10-CM

## 2022-12-30 NOTE — Patient Instructions (Signed)
Medication Instructions:  NO CHANGES  *If you need a refill on your cardiac medications before your next appointment, please call your pharmacy*   Follow-Up: At [facility], you and your health needs are our priority.  As part of our continuing mission to provide you with exceptional heart care, we have created designated Provider Care Teams.  These Care Teams include your primary Cardiologist (physician) and Advanced Practice Providers (APPs -  Physician Assistants and Nurse Practitioners) who all work together to provide you with the care you need, when you need it.  We recommend signing up for the patient portal called "MyChart".  Sign up information is provided on this After Visit Summary.  MyChart is used to connect with patients for Virtual Visits (Telemedicine).  Patients are able to view lab/test results, encounter notes, upcoming appointments, etc.  Non-urgent messages can be sent to your provider as well.   To learn more about what you can do with MyChart, go to [email or web address].    Your next appointment:    6 months with Azalee Course PA or Dr. Rennis Golden

## 2022-12-30 NOTE — Progress Notes (Signed)
OFFICE NOTE  Chief Complaint:  No complaints  Primary Care Physician: Viann Shove, MD  HPI:  [name] is a 89 year old gentleman who had bypass in 2006, a normal nuclear study in 2009 and has done well without any episodes of angina. He has remained active and does exercise several times a week and has had no worsening shortness of breath, palpitations, presyncope or syncopal symptoms. He also has a history of right carotid disease which he was told to be about 60% at the time of bypass, however, recent Dopplers show only mild disease. He does have a very faint bruit. He last saw Shane Finlay, PA-C, in the office this past summer. He was having problems with elevated blood pressures at night. It was recommended that he change his blood pressure medications around, however he did not make that change.  He was recently admitted to [place] for dehydration in the setting of pneumonia or viral influenza. He was taking his diuretics in addition to ongoing fluid losses. This cause orthostatic hypotension. His diuretic was held and his lisinopril was stopped due to worsening renal function. He was switched to amlodipine for better blood pressure control, but has not been taking the medicine due to confusion of his medicines. Currently reports his blood pressures have improved and is not have any significant swelling. In fact he is now hypertensive again. He is recently switched from warfarin over to Xarelto for better control of anticoagulation for his A. fib. He seems to be able to get the medication now with out undue cost.  [name] turns today for followup. He occasionally gets some lightheadedness. He's noted some blood pressures up into the 190s at home. He says however he feels bad when his blood pressure is more in the 120s to 130s.  I saw [name] back today in the office. Overall he reports doing fairly well. He decreased his exercise somewhat due to his wife being  sick but is interested in getting back into exercise routine. His last stress test was 7 years ago. His bypass was now 10 years ago. He denies any chest pain or worsening shortness of breath. Blood pressure is running somewhat high. His diuretics were stopped due to worsening renal function which seems to have normalized.  [name] returns today for follow-up. His main complaint has to do with pain in his back and decreased function in his legs. He says it's very difficult for him to get around. Although he has pain in his legs his symptoms are not necessarily consistent with claudication. He does have a history of decreased ABIs in the past and has not had lower extremity arterial Dopplers in some time. I was also recently reminded that he does have a history of renal artery aneurysm which was noted on the CT scan. This has not been reassessed in several years. He denies any significant symptoms with atrial fibrillation has had no bleeding problems on Savaysa.  01/04/16  [name] was seen today in follow-up. He denies any worsening chest pain or shortness of breath. He continues to have some tightness in his legs. He had normal ABIs by Dopplers last year however will need a repeat of that. His renal Dopplers also failed to indicate any stenosis. He is not in atrial fibrillation today and is concerned about the cost of Savaysa. He is interested in switching NOACs. He also brought lab work from the [place] today which showed adequate thyroid levels, elevated uric acid at 8.1  and LDL of 27 with total cholesterol of 115. He is not on statins.  12/23/2016  [name] returns today for follow-up.  Over the past year he has done well.  He denies any worsening shortness of breath or chest pain.  He had lower extremity Dopplers and carotid Dopplers this past year which indicate no significant insufficiency or stenosis.  He denies any recurrent atrial fibrillation.  He switched from [facility] over to Eliquis.  He has  not had any recent lab work.  He tells me his last lipid profile which I reviewed from the [place] showed his total cholesterol of 115.  He has not been on statin therapy due to this.  Fortunately, he has not had any recurrent coronary disease.  He has had no progressive peripheral arterial disease as well.  12/29/2017  [name] is seen today in annual follow-up.  Overall he is doing well denies any chest pain or worsening shortness of breath.  Carotid Doppler showed no significant carotid stenosis.  He does complain of some neck pain which is likely arthritis.  He has been tolerating Eliquis without bleeding problems.  He has had improvement in swelling on low-dose thiazide.  Cholesterol recently well controlled with total 189, triglycerides 65, HDL 56 and LDL 40.  01/03/2019  [name] is seen today for routine follow-up.  He continues to do well.  Weight is back up about 6 pounds.  He had at one point been below 190 pounds.  He denies chest pain or worsening shortness of breath.  His blood pressure this morning was 130/70 but is elevated some today in the office.  He does report some issues with ongoing urinary frequency particularly at night.  He asked about changing around his medications however this is primarily managed by the [place].  He denies any palpitations.  EKG today shows he is maintaining sinus rhythm with first-degree AV block.  He had no bleeding problems on Eliquis which she takes regularly.  Recent labs showed LDL of 51.  12/27/2019  [name] returns today for follow-up.  He recently was diagnosed with a throat cancer.  He underwent partial resection and radiation therapy and seems to be doing well.  He said a PET scan indicated no evidence of any spread of the cancer.  He struggled with hoarseness of his voice.  He denies any chest pain or shortness of breath.  He says he was able to play golf throughout the summer without any issues.  EKG shows a sinus bradycardia first-degree AV block.   He is not had any recurrent A. fib that he is aware of.  12/25/2020  [name] is seen today for an acute visit regarding left arm pain.  There is no associated chest pain however he had primarily left arm pain and weakness prior to his bypass surgery in 2006.  His symptoms were significant enough that he went ahead and called EMS.  An EKG was performed and a rhythm strip which captured frequent PVCs.  I personally reviewed that today.  His EKG in the office however shows no ectopy.  In fact there is a sinus rhythm with first-degree AV block and LVH by voltage at 67, unchanged from prior EKG.  Blood pressures well controlled today.  He said he eventually took nitroglycerin and his symptoms eased off however the prescription is quite old and barely made any burning under his tongue.  He denies any worsening symptoms with exertion or lifting.  His last stress test  was in 2016 which was negative for ischemia  04/05/2021  [name] returns today for follow-up.  Overall he seems to be doing well.  He denies any worsening chest pain or shortness of breath.  When I last saw him he was having left arm pain.  We did do a Myoview stress test which was negative for ischemia, back in December 2022.  Since then he has not done any worse.  Blood pressure is well controlled today.  EKG shows sinus rhythm.  06/27/2022  [name] is seen today for preoperative clearance.  Overall he says he is doing well.  He saw Azalee Course, PA-C at the end of last year.  An echo was performed which showed low normal EF 50 to 55% and some lower extremity edema.  He was started on Lasix for this and has had significant improvement.  The etiology of his mild reduction in LV function is not clear but might be related to PVCs.  He has had some PAF but appears to be in sinus with PVCs today.  Blood pressure is well-controlled.  He denies any chest pain or shortness of breath.  He is scheduled to have an upcoming EGD.  It would be okay to hold  his Eliquis for 2 days prior to the procedure and restart after.  12/30/2022  [name] is seen today in follow-up.  Overall he seems to be doing fairly well.  Blood pressure is mildly elevated today.  Cholesterol however has been well-controlled.  Total recently was 117, HDL 55, triglycerides 77 and LDL 46.  He has a history of PAF and EKG today shows he is in A-fib although heart rates in the 50s.  He is asymptomatic with this.  He is anticoagulated on Eliquis.  No bleeding concerns with this.   Past Medical History:  Diagnosis Date   BPH (benign prostatic hyperplasia)    CAD (coronary artery disease) of artery bypass graft 02/04/2004   Fatty liver    GERD (gastroesophageal reflux disease)    Gilbert's syndrome    Hiatal hernia    History of nuclear stress test 11/04/2007   bruce myoview; normal pattern of persuion in all regions; post-stress EF 70%; low risk    HOH (hard of hearing)    HTN (hypertension)    Neuropathy    PAF (paroxysmal atrial fibrillation) (HCC)    PVD (peripheral vascular disease) (HCC)    0-49% R & L ICA stenosis (2013)    Renal artery aneurysm (HCC)    S/P CABG (coronary artery bypass graft) 02/04/2004   Schatzki's ring    non critical   Throat cancer (HCC)    Thrombocytopenia (HCC)     Past Surgical History:  Procedure Laterality Date   CARDIOVERSION N/A 01/14/2018   Procedure: CARDIOVERSION;  Surgeon: Parke Poisson, MD;  Location: [facility] ENDOSCOPY;  Service: Cardiovascular;  Laterality: N/A;   CATARACT EXTRACTION  2011   COLONOSCOPY  2006   Dr. Houston Siren pancolonic diverticula   COLONOSCOPY  01/07/2012   Dr. Midge Minium normal rectum, pancolonic diverticulosis, hyperplastic polyp   COLONOSCOPY N/A 03/20/2015   [id number] diverticulosis   CORONARY ARTERY BYPASS GRAFT  04/2004   LIMA to diagonal, SVG to ramus intermedius, SVG to PDA; placed stent for crossed coronary arteries (Dr. Donata Clay)   ESOPHAGEAL BANDING N/A 08/20/2022    Procedure: ESOPHAGEAL BANDING;  Surgeon: Corbin Ade, MD;  Location: [facility];  Service: Endoscopy;  Laterality: N/A;   ESOPHAGOGASTRODUODENOSCOPY  2006  Dr. Reita Chard Schatzki's ring, s/p 58-F Maloney dilation   ESOPHAGOGASTRODUODENOSCOPY  01/07/2012   Dr. Chauncey Cruel ring, hiatal hernia, fundic gland polyp   ESOPHAGOGASTRODUODENOSCOPY N/A 03/20/2015   WNU:UVOZD 1 varices/HH Gastric polpy s/p bx   ESOPHAGOGASTRODUODENOSCOPY N/A 07/28/2018   Dr. Jena Gauss: 3 short columns of grade 1 esophageal varices.  Esophageal dilation performed for dysphagia.  Multiple gastric polyps previously proved benign.   ESOPHAGOGASTRODUODENOSCOPY (EGD) WITH PROPOFOL N/A 08/20/2022   Procedure: ESOPHAGOGASTRODUODENOSCOPY (EGD) WITH PROPOFOL;  Surgeon: Corbin Ade, MD;  Location: [facility];  Service: Endoscopy;  Laterality: N/A;  11:00 am, asa 3   MALONEY DILATION N/A 07/28/2018   Procedure: MALONEY DILATION;  Surgeon: Corbin Ade, MD;  Location: [facility];  Service: Endoscopy;  Laterality: N/A;   MALONEY DILATION N/A 08/20/2022   Procedure: Elease Hashimoto DILATION;  Surgeon: Corbin Ade, MD;  Location: [facility];  Service: Endoscopy;  Laterality: N/A;   rotater cuff Left    repair   TONSILLECTOMY      FAMHx:  Family History  Problem Relation Age of Onset   Heart attack Mother 54       deceased   Heart attack Father 22       deceased   Heart disease Brother        x2   Diabetes Brother        x3   Colon cancer Neg Hx     SOCHx:   reports that he quit smoking about 18 years ago. His smoking use included cigarettes. He started smoking about 58 years ago. He has a 40 pack-year smoking history. He has never used smokeless tobacco. He reports current alcohol use. He reports that he does not use drugs.  ALLERGIES:  Allergies  Allergen Reactions   Antihistamines, Chlorpheniramine-Type Rash    Patient states he cant take large doses of antihistamines also causes prostate  problems    ROS: Pertinent items noted in HPI and remainder of comprehensive ROS otherwise negative.  HOME MEDS: Current Outpatient Medications  Medication Sig Dispense Refill   allopurinol (ZYLOPRIM) 100 MG tablet Take 100 mg by mouth at bedtime.      amLODipine (NORVASC) 5 MG tablet Take 1 tablet (5 mg total) by mouth daily. 90 tablet 0   apixaban (ELIQUIS) 5 MG TABS tablet Take 1 tablet (5 mg total) by mouth 2 (two) times daily. 180 tablet 3   betamethasone valerate (VALISONE) 0.1 % cream Apply topically 2 (two) times daily.     Ensure Plus (ENSURE PLUS) LIQD Take 237 mLs by mouth.     finasteride (PROSCAR) 5 MG tablet Take 5 mg by mouth at bedtime.      fluticasone (FLONASE) 50 MCG/ACT nasal spray Place 1 spray into both nostrils 2 (two) times a day.     furosemide (LASIX) 40 MG tablet Take 1 tablet by mouth once daily 90 tablet 2   gabapentin (NEURONTIN) 300 MG capsule Take 300 mg by mouth 3 (three) times daily.       Levothyroxine Sodium 112 MCG CAPS Take 112 mcg by mouth daily before breakfast.      losartan (COZAAR) 100 MG tablet Take 50 mg by mouth daily.     metoprolol tartrate (LOPRESSOR) 25 MG tablet Take 1 tablet (25 mg total) by mouth 2 (two) times daily. 180 tablet 3   Misc Natural Products (BEET ROOT PO) Take by mouth.     Multiple Vitamins-Minerals (PRESERVISION AREDS 2+MULTI VIT) CAPS Take 1 capsule by  mouth 2 (two) times daily.     nitroGLYCERIN (NITROSTAT) 0.4 MG SL tablet DISSOLVE ONE TABLET UNDER THE TONGUE EVERY 5 MINUTES AS NEEDED FOR CHEST PAIN.  DO NOT EXCEED A TOTAL OF 3 DOSES IN 15 MINUTES 25 tablet 0   RABEprazole (ACIPHEX) 20 MG tablet Take 1 tablet (20 mg total) by mouth 2 (two) times daily. (Patient taking differently: Take 20 mg by mouth in the morning and at bedtime.) 180 tablet 3   sildenafil (VIAGRA) 100 MG tablet Take 100 mg by mouth daily as needed for erectile dysfunction.     tamsulosin (FLOMAX) 0.4 MG CAPS capsule Take 0.8 mg by mouth at bedtime.      vitamin B-12 (CYANOCOBALAMIN) 500 MCG tablet Take 500 mcg by mouth daily.     No current facility-administered medications for this visit.    LABS/IMAGING: No results found for this or any previous visit (from the past 48 hour(s)). No results found.  VITALS: BP 138/62 (BP Location: Left Arm, Patient Position: Sitting, Cuff Size: Normal)   Pulse (!) 56   Ht 5\' 8"  (1.727 m)   Wt 189 lb (85.7 kg)   SpO2 99%   BMI 28.74 kg/m   EXAM: General appearance: alert and no distress Neck: no carotid bruit and no JVD Lungs: clear to auscultation bilaterally Heart: regular rate and rhythm, S1, S2 normal, and systolic murmur: systolic ejection 3/6, crescendo at 2nd left intercostal space Abdomen: soft, non-tender; bowel sounds normal; no masses,  no organomegaly Extremities: extremities normal, atraumatic, no cyanosis or edema Pulses: 2+ and symmetric Skin: Skin color, texture, turgor normal. No rashes or lesions Neurologic: Grossly normal Psych: Mood, affect normal  EKG: EKG Interpretation Date/Time:  Tuesday December 30 2022 13:44:57 EST Ventricular Rate:  60 PR Interval:    QRS Duration:  112 QT Interval:  440 QTC Calculation: 440 R Axis:   -10  Text Interpretation: Atrial fibrillation Moderate voltage criteria for LVH, may be normal variant ( R in aVL , Cornell product ) ST & T wave abnormality, consider inferior ischemia When compared with ECG of 09-Jan-2022 18:34, Atrial fibrillation has replaced Sinus rhythm Criteria for Septal infarct are no longer Present Confirmed by Zoila Shutter [phone]) on 12/30/2022 1:55:47 PM    ASSESSMENT: Left arm pain -low risk Myoview stress test (01/2021) Frequent PVCs Coronary artery disease status post three-vessel CABG in 2006 (LIMA to diagonal, SVG to PDA and SVG to ramus intermedius) Aortic murmur Negative Myoview stress test in 2016  Hypertension Mild carotid artery disease Mild dyspnea with marked exertion PAF-on Eliquis Leg pain and  weakness with walking Renal Artery aneurysm 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   [name] seems to be doing well although is in A-fib today.  He is asymptomatic with it.  Rate is well-controlled.  Will continue anticoagulation.  He denies any chest pain.  He does have some aortic stenosis seen on echo in November 2023.  He will need a repeat echo likely next year  Plan follow-up in 6 months or sooner as necessary.  Chrystie Nose, MD, [facility], FACP  [place]  [facility]  Medical Director of the [facility]  [facility] Attending Cardiologist  Direct Dial: [phone]  Fax: [phone]  Website:  [email or web address] Coran Dipaola 12/30/2022, 1:55 PM

## 2023-02-23 ENCOUNTER — Encounter: Payer: Self-pay | Admitting: Internal Medicine

## 2023-02-26 ENCOUNTER — Encounter: Payer: Self-pay | Admitting: Internal Medicine

## 2023-03-05 DIAGNOSIS — N138 Other obstructive and reflux uropathy: Secondary | ICD-10-CM | POA: Diagnosis not present

## 2023-03-16 ENCOUNTER — Other Ambulatory Visit: Payer: Self-pay | Admitting: Internal Medicine

## 2023-03-31 ENCOUNTER — Ambulatory Visit (INDEPENDENT_AMBULATORY_CARE_PROVIDER_SITE_OTHER): Payer: Medicare Other | Admitting: Internal Medicine

## 2023-03-31 ENCOUNTER — Encounter: Payer: Self-pay | Admitting: Internal Medicine

## 2023-03-31 VITALS — BP 130/60 | HR 58 | Temp 97.6°F | Ht 68.0 in | Wt 193.6 lb

## 2023-03-31 DIAGNOSIS — K7460 Unspecified cirrhosis of liver: Secondary | ICD-10-CM

## 2023-03-31 DIAGNOSIS — K219 Gastro-esophageal reflux disease without esophagitis: Secondary | ICD-10-CM | POA: Diagnosis not present

## 2023-03-31 DIAGNOSIS — R150 Incomplete defecation: Secondary | ICD-10-CM | POA: Diagnosis not present

## 2023-03-31 NOTE — Progress Notes (Unsigned)
 Primary Care Physician:  Viann Shove, MD Primary Gastroenterologist:  Dr. Jena Gauss  Pre-Procedure History & Physical: HPI:  [name] is a 89 y.o. male here for follow-up of well compensated mass old related cirrhosis.  LFTs last year were normal albumin 4.0.  Cirrhotic liver on ultrasound no evidence of hepatoma.  No varices on last EGD empirically passed a 56 French Maloney dilator no varices and no portal gastropathy.  Stable gastric polyp was benign on biopsies.  He is very active goes to the [facility] and plays golf weather permitting.  Notes bowels are move regularly once to 2 times daily.  Often times feels that he does not totally evacuate and has to do extra wiping.  No fiber supplementation does not have to strain or dwell on toilet  Past Medical History:  Diagnosis Date   BPH (benign prostatic hyperplasia)    CAD (coronary artery disease) of artery bypass graft 02/04/2004   Fatty liver    GERD (gastroesophageal reflux disease)    Gilbert's syndrome    Hiatal hernia    History of nuclear stress test 11/04/2007   bruce myoview; normal pattern of persuion in all regions; post-stress EF 70%; low risk    HOH (hard of hearing)    HTN (hypertension)    Neuropathy    PAF (paroxysmal atrial fibrillation) (HCC)    PVD (peripheral vascular disease) (HCC)    0-49% R & L ICA stenosis (2013)    Renal artery aneurysm (HCC)    S/P CABG (coronary artery bypass graft) 02/04/2004   Schatzki's ring    non critical   Throat cancer (HCC)    Thrombocytopenia (HCC)     Past Surgical History:  Procedure Laterality Date   CARDIOVERSION N/A 01/14/2018   Procedure: CARDIOVERSION;  Surgeon: Parke Poisson, MD;  Location: [facility] END[facility]COPY;  Service: Cardiovascular;  Laterality: N/A;   CATARACT EXTRACTION  2011   COLONOSCOPY  2006   Dr. Houston Siren pancolonic diverticula   COLONOSCOPY  01/07/2012   Dr. Midge Minium normal rectum, pancolonic diverticulosis, hyperplastic  polyp   COLONOSCOPY N/A 03/20/2015   JYN:[id number] diverticulosis   CORONARY ARTERY BYPASS GRAFT  04/2004   LIMA to diagonal, SVG to ramus intermedius, SVG to PDA; placed stent for crossed coronary arteries (Dr. Donata Clay)   ESOPHAGEAL BANDING N/A 08/20/2022   Procedure: ESOPHAGEAL BANDING;  Surgeon: Corbin Ade, MD;  Location: [facility];  Service: Endoscopy;  Laterality: N/A;   ESOPHAGOGASTRODUODENOSCOPY  2006   Dr. Reita Chard Schatzki's ring, s/p 58-F Maloney dilation   ESOPHAGOGASTRODUODENOSCOPY  01/07/2012   Dr. Chauncey Cruel ring, hiatal hernia, fundic gland polyp   ESOPHAGOGASTRODUODENOSCOPY N/A 03/20/2015   MVH:QIONG 1 varices/HH Gastric polpy s/p bx   ESOPHAGOGASTRODUODENOSCOPY N/A 07/28/2018   Dr. Jena Gauss: 3 short columns of grade 1 esophageal varices.  Esophageal dilation performed for dysphagia.  Multiple gastric polyps previously proved benign.   ESOPHAGOGASTRODUODENOSCOPY (EGD) WITH PROPOFOL N/A 08/20/2022   Procedure: ESOPHAGOGASTRODUODENOSCOPY (EGD) WITH PROPOFOL;  Surgeon: Corbin Ade, MD;  Location: [facility];  Service: Endoscopy;  Laterality: N/A;  11:00 am, asa 3   MALONEY DILATION N/A 07/28/2018   Procedure: MALONEY DILATION;  Surgeon: Corbin Ade, MD;  Location: [facility];  Service: Endoscopy;  Laterality: N/A;   MALONEY DILATION N/A 08/20/2022   Procedure: Elease Hashimoto DILATION;  Surgeon: Corbin Ade, MD;  Location: [facility];  Service: Endoscopy;  Laterality: N/A;   rotater cuff Left    repair  TONSILLECTOMY      Prior to Admission medications   Medication Sig Start Date End Date Taking? Authorizing Provider  allopurinol (ZYLOPRIM) 100 MG tablet Take 100 mg by mouth at bedtime.    Yes [provider]  amLODipine (NORVASC) 5 MG tablet Take 1 tablet (5 mg total) by mouth daily. 02/07/22  Yes Hilty, Lisette Abu, MD  apixaban (ELIQUIS) 5 MG TABS tablet Take 1 tablet (5 mg total) by mouth 2 (two) times daily. 01/24/16  Yes Hilty,  Lisette Abu, MD  betamethasone valerate (VALISONE) 0.1 % cream Apply topically 2 (two) times daily.   Yes [provider]  Ensure Plus (ENSURE PLUS) LIQD Take 237 mLs by mouth.   Yes [provider]  finasteride (PROSCAR) 5 MG tablet Take 5 mg by mouth at bedtime.    Yes [provider]  fluticasone (FLONASE) 50 MCG/ACT nasal spray Place 1 spray into both nostrils 2 (two) times a day.   Yes [provider]  furosemide (LASIX) 40 MG tablet Take 1 tablet by mouth once daily 09/12/22  Yes Hilty, Lisette Abu, MD  gabapentin (NEURONTIN) 300 MG capsule Take 300 mg by mouth 3 (three) times daily.     Yes [provider]  Levothyroxine Sodium 112 MCG CAPS Take 112 mcg by mouth daily before breakfast.    Yes [provider]  losartan (COZAAR) 100 MG tablet Take 50 mg by mouth daily.   Yes [provider]  metoprolol tartrate (LOPRESSOR) 25 MG tablet Take 1 tablet by mouth twice daily 03/17/23  Yes Hilty, Lisette Abu, MD  Misc Natural Products (BEET ROOT PO) Take by mouth.   Yes [provider]  Multiple Vitamins-Minerals (PRESERVISION AREDS 2+MULTI VIT) CAPS Take 1 capsule by mouth 2 (two) times daily.   Yes [provider]  nitroGLYCERIN (NITROSTAT) 0.4 MG SL tablet DISSOLVE ONE TABLET UNDER THE TONGUE EVERY 5 MINUTES AS NEEDED FOR CHEST PAIN.  DO NOT EXCEED A TOTAL OF 3 DOSES IN 15 MINUTES 12/26/22  Yes Hilty, Lisette Abu, MD  RABEprazole (ACIPHEX) 20 MG tablet Take 1 tablet (20 mg total) by mouth 2 (two) times daily. Patient taking differently: Take 20 mg by mouth in the morning and at bedtime. 09/10/18  Yes Desi Carby, Gerrit Friends, MD  sildenafil (VIAGRA) 100 MG tablet Take 100 mg by mouth daily as needed for erectile dysfunction.   Yes [provider]  tamsulosin (FLOMAX) 0.4 MG CAPS capsule Take 0.8 mg by mouth at bedtime.   Yes [provider]  vitamin B-12 (CYANOCOBALAMIN) 500 MCG tablet Take 500 mcg by mouth daily.   Yes  [provider]    Allergies as of 03/31/2023 - Review Complete 03/31/2023  Allergen Reaction Noted   Antihistamines, chlorpheniramine-type Rash 08/26/2011    Family History  Problem Relation Age of Onset   Heart attack Mother 79       deceased   Heart attack Father 72       deceased   Heart disease Brother        x2   Diabetes Brother        x3   Colon cancer Neg Hx     Social History   Socioeconomic History   Marital status: Married    Spouse name: Not on file   Number of children: Not on file   Years of education: Not on file   Highest education level: Not on file  Occupational History   Not on file  Tobacco  Use   Smoking status: Former    Current packs/day: 0.00    Average packs/day: 1 pack/day for 40.0 years (40.0 ttl pk-yrs)    Types: Cigarettes    Start date: 04/07/1964    Quit date: 04/07/2004    Years since quitting: 18.9   Smokeless tobacco: Never  Vaping Use   Vaping status: Never Used  Substance and Sexual Activity   Alcohol use: Yes    Comment: occassional drinker   Drug use: No   Sexual activity: Never  Other Topics Concern   Not on file  Social History Narrative   Not on file   Social Drivers of Health   Financial Resource Strain: Not on file  Food Insecurity: Not on file  Transportation Needs: Not on file  Physical Activity: Not on file  Stress: Not on file  Social Connections: Not on file  Intimate Partner Violence: Not on file    Review of Systems: See HPI, otherwise negative ROS  Physical Exam: BP 130/60 (BP Location: Right Arm, Patient Position: Sitting, Cuff Size: Normal)   Pulse (!) 58   Temp 97.6 F (36.4 C) (Oral)   Ht 5\' 8"  (1.727 m)   Wt 193 lb 9.6 oz (87.8 kg)   SpO2 97%   BMI 29.44 kg/m  General:   Alert,  Well-developed, well-nourished, pleasant and cooperative in NAD Lungs:  Clear throughout to auscultation.   No wheezes, crackles, or rhonchi. No acute distress. Heart:  Regular rate and rhythm; no  murmurs, clicks, rubs,  or gallops. Abdomen: Non-distended, normal bowel sounds.  Soft and nontender without appreciable mass or hepatosplenomegaly.   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  GERD well-controlled on twice daily PPI  Dysphagia resolved status post Maloney dilation of a normal-appearing esophagus.  This gentleman has a very well compensated cirrhosis.  No evidence of portal hypertension.  Future surveillance EGD will be guided by noninvasive studies.  Some difficulty with evacuation.  Suspect bolstering his daily fiber intake will help with those symptoms  Recommendations   We need updated labs c-Met, CBC, INR and AFP.  Ultrasound of your liver was 6 months  Continue exercise as you are doing  Continue drinking at least 1 to 2 cups of coffee daily  Continue taking rabeprazole 20 mg daily best taken 30 minutes before breakfast and supper  My goal for you is to lose 10 pounds in the next 6 months  Begin Benefiber for better evacuation.  Start with 1 tablespoon daily for 3 weeks; then increase to 2 tablespoons daily thereafter.  Stay on 2 tablespoons daily.   Notice: This dictation was prepared with Dragon dictation along with smaller phrase technology. Any transcriptional errors that result from this process are unintentional and may not be corrected upon review.

## 2023-03-31 NOTE — Patient Instructions (Signed)
 It was good to see you again today!  Your liver continues to do well.  We need updated labs c-Met, CBC, INR and AFP.  Ultrasound of your liver was 6 months  Continue exercise as you are doing  Continue drinking at least 1 to 2 cups of coffee daily  Continue taking rabeprazole 20 mg daily best taken 30 minutes before breakfast and supper  My goal for you is to lose 10 pounds in the next 6 months  Begin Benefiber for better evacuation.  Start with 1 tablespoon daily for 3 weeks; then increase to 2 tablespoons daily thereafter.  Stay on 2 tablespoons daily.  Office visit with me in 6 months

## 2023-04-01 LAB — CBC WITH DIFFERENTIAL/PLATELET
Basophils Absolute: 0 10*3/uL (ref 0.0–0.2)
Basos: 1 %
EOS (ABSOLUTE): 0.1 10*3/uL (ref 0.0–0.4)
Eos: 4 %
Hematocrit: 29.1 % — ABNORMAL LOW (ref 37.5–51.0)
Hemoglobin: 9.4 g/dL — ABNORMAL LOW (ref 13.0–17.7)
Immature Grans (Abs): 0 10*3/uL (ref 0.0–0.1)
Immature Granulocytes: 0 %
Lymphocytes Absolute: 0.9 10*3/uL (ref 0.7–3.1)
Lymphs: 23 %
MCH: 30.1 pg (ref 26.6–33.0)
MCHC: 32.3 g/dL (ref 31.5–35.7)
MCV: 93 fL (ref 79–97)
Monocytes Absolute: 0.4 10*3/uL (ref 0.1–0.9)
Monocytes: 10 %
Neutrophils Absolute: 2.4 10*3/uL (ref 1.4–7.0)
Neutrophils: 62 %
Platelets: 146 10*3/uL — ABNORMAL LOW (ref 150–450)
RBC: 3.12 x10E6/uL — ABNORMAL LOW (ref 4.14–5.80)
RDW: 13.7 % (ref 11.6–15.4)
WBC: 3.9 10*3/uL (ref 3.4–10.8)

## 2023-04-01 LAB — COMPREHENSIVE METABOLIC PANEL
ALT: 6 IU/L (ref 0–44)
AST: 15 IU/L (ref 0–40)
Albumin: 4.2 g/dL (ref 3.7–4.7)
Alkaline Phosphatase: 115 IU/L (ref 44–121)
BUN/Creatinine Ratio: 11 (ref 10–24)
BUN: 15 mg/dL (ref 8–27)
Bilirubin Total: 0.6 mg/dL (ref 0.0–1.2)
CO2: 23 mmol/L (ref 20–29)
Calcium: 9.1 mg/dL (ref 8.6–10.2)
Chloride: 103 mmol/L (ref 96–106)
Creatinine, Ser: 1.38 mg/dL — ABNORMAL HIGH (ref 0.76–1.27)
Globulin, Total: 2.1 g/dL (ref 1.5–4.5)
Glucose: 103 mg/dL — ABNORMAL HIGH (ref 70–99)
Potassium: 4.6 mmol/L (ref 3.5–5.2)
Sodium: 141 mmol/L (ref 134–144)
Total Protein: 6.3 g/dL (ref 6.0–8.5)
eGFR: 49 mL/min/{1.73_m2} — ABNORMAL LOW (ref >59)

## 2023-04-01 LAB — AFP TUMOR MARKER: AFP, Serum, Tumor Marker: 3.4 ng/mL (ref 0.0–6.4)

## 2023-04-01 LAB — PROTIME-INR
INR: 1.1 (ref 0.9–1.2)
Prothrombin Time: 12.2 s — ABNORMAL HIGH (ref 9.1–12.0)

## 2023-04-09 DIAGNOSIS — N1831 Chronic kidney disease, stage 3a: Secondary | ICD-10-CM | POA: Diagnosis not present

## 2023-04-09 DIAGNOSIS — N138 Other obstructive and reflux uropathy: Secondary | ICD-10-CM | POA: Diagnosis not present

## 2023-04-16 DIAGNOSIS — N1831 Chronic kidney disease, stage 3a: Secondary | ICD-10-CM | POA: Diagnosis not present

## 2023-07-23 ENCOUNTER — Ambulatory Visit: Attending: Physician Assistant | Admitting: Physician Assistant

## 2023-07-23 ENCOUNTER — Encounter: Payer: Self-pay | Admitting: Physician Assistant

## 2023-07-23 VITALS — BP 162/54 | HR 62 | Ht 68.0 in

## 2023-07-23 DIAGNOSIS — I25810 Atherosclerosis of coronary artery bypass graft(s) without angina pectoris: Secondary | ICD-10-CM

## 2023-07-23 DIAGNOSIS — K7581 Nonalcoholic steatohepatitis (NASH): Secondary | ICD-10-CM

## 2023-07-23 DIAGNOSIS — I4821 Permanent atrial fibrillation: Secondary | ICD-10-CM | POA: Diagnosis not present

## 2023-07-23 DIAGNOSIS — I351 Nonrheumatic aortic (valve) insufficiency: Secondary | ICD-10-CM | POA: Diagnosis not present

## 2023-07-23 DIAGNOSIS — K7460 Unspecified cirrhosis of liver: Secondary | ICD-10-CM | POA: Diagnosis not present

## 2023-07-23 DIAGNOSIS — I6529 Occlusion and stenosis of unspecified carotid artery: Secondary | ICD-10-CM

## 2023-07-23 DIAGNOSIS — I10 Essential (primary) hypertension: Secondary | ICD-10-CM

## 2023-07-23 DIAGNOSIS — I2510 Atherosclerotic heart disease of native coronary artery without angina pectoris: Secondary | ICD-10-CM

## 2023-07-23 NOTE — Patient Instructions (Signed)
 Medication Instructions:  NO CHANGES *If you need a refill on your cardiac medications before your next appointment, please call your pharmacy*  Lab Work: NO LABS If you have labs (blood work) drawn today and your tests are completely normal, you will receive your results only by: MyChart Message (if you have MyChart) OR A paper copy in the mail If you have any lab test that is abnormal or we need to change your treatment, we will call you to review the results.  Testing/Procedures:[address] Your physician has requested that you have an echocardiogram. Echocardiography is a painless test that uses sound waves to create images of your heart. It provides your doctor with information about the size and shape of your heart and how well your heart's chambers and valves are working. This procedure takes approximately one hour. There are no restrictions for this procedure. Please do NOT wear cologne, perfume, aftershave, or lotions (deodorant is allowed). Please arrive 15 minutes prior to your appointment time.  Please note: We ask at that you not bring children with you during ultrasound (echo/ vascular) testing. Due to room size and safety concerns, children are not allowed in the ultrasound rooms during exams. Our front office staff cannot provide observation of children in our lobby area while testing is being conducted. An adult accompanying a patient to their appointment will only be allowed in the ultrasound room at the discretion of the ultrasound technician under special circumstances. We apologize for any inconvenience.   Follow-Up: At [facility], you and your health needs are our priority.  As part of our continuing mission to provide you with exceptional heart care, our providers are all part of one team.  This team includes your primary Cardiologist (physician) and Advanced Practice Providers or APPs (Physician Assistants and Nurse Practitioners) who all work together to  provide you with the care you need, when you need it.  Your next appointment:   6 month(s)  Provider:   Hazle Lites, MD    We recommend signing up for the patient portal called MyChart.  Sign up information is provided on this After Visit Summary.  MyChart is used to connect with patients for Virtual Visits (Telemedicine).  Patients are able to view lab/test results, encounter notes, upcoming appointments, etc.  Non-urgent messages can be sent to your provider as well.   To learn more about what you can do with MyChart, go to [email or web address].

## 2023-07-23 NOTE — Progress Notes (Signed)
 Cardiology Office Note   Date:  07/25/2023  ID:  [name], DOB 1933-05-06, MRN [id number] PCP: Shayla Malcom Donalds, MD  [facility] Providers Cardiologist:  Vinie JAYSON Maxcy, MD     History of Present Illness [name] is a 89 y.o. male with a hx of CAD s/p CABG 2006, fatty liver, hypertension, hyperlipidemia, PAD, NASH cirrhosis and PAF.  He has a history of mild carotid artery disease.  Myoview  was obtained in April 2016 which showed EF 58%, normal stress nuclear study.  Echocardiogram obtained on 01/13/2018 showed EF 55 to 60%, moderate LVH, PA peak pressure 36 mmHg.  He was on Coumadin in the past for A. fib, this was later switched to Xarelto .  This was later switched to Sayvasa and eventually Eliquis  5 mg twice daily.  His last cardioversion was in December 2019.  I last saw the patient in May 2021 at which time he was seen by GI service for his NASH cirrhosis with stage I esophageal varices.  He was diagnosed with throat cancer in 2021 and underwent partial resection and radiation therapy.  PET scan showed no evidence of metastasis.  Echocardiogram obtained on 02/08/2020 showed EF 60 to 65%, grade 2 DD, RVSP 39.2 mmHg, trivial MR, mild aortic stenosis.  Repeat Myoview  in December 2022 was negative for ischemia, EF 67%.  He was back in rate controlled atrial fibrillation in October 2023, given lack of symptom, we ultimately decided to focus on rate control.  Echocardiogram obtained on 12/16/2021 showed EF 50 to 55%, no regional wall motion abnormality, moderate LVH of basal septal segment, RV systolic function was normal, RVSP 41.6 mmHg, mild MR, mild to moderate AI.  Over the years, he has been going in and out of atrial fibrillation.  Again, our strategy is going to continue with rate control therapy instead of rhythm control.  Patient presents today for follow-up.  He is back in sinus rhythm, however I expect him to keep going in and out of A-fib.  He does not have any  cardiac awareness of atrial fibrillation at this time.  He denies any chest pain or shortness of breath.  I will order a repeat echocardiogram this year to follow-up on his mild to moderate aortic regurgitation.  Otherwise, he can follow-up with Dr. Maxcy in 6 months.  ROS:   He denies chest pain, palpitations, dyspnea, pnd, orthopnea, n, v, dizziness, syncope, edema, weight gain, or early satiety. All other systems reviewed and are otherwise negative except as noted above.    Studies Reviewed EKG Interpretation Date/Time:  Thursday July 23 2023 09:59:06 EDT Ventricular Rate:  64 PR Interval:  330 QRS Duration:  120 QT Interval:  448 QTC Calculation: 462 R Axis:   -43  Text Interpretation: Sinus rhythm with marked sinus arrhythmia with 1st degree A-V block Left axis deviation Incomplete left bundle branch block Minimal voltage criteria for LVH, may be normal variant ( Cornell product ) Nonspecific ST and T wave abnormality When compared with ECG of 30-Dec-2022 13:44, Sinus rhythm has replaced Atrial fibrillation Incomplete left bundle branch block is now Present T wave inversion no longer evident in Inferior leads Confirmed by Shuna Tabor [phone]) on 07/25/2023 10:06:20 AM    Cardiac Studies & Procedures   ______________________________________________________________________________________________   STRESS TESTS  MYOCARDIAL PERFUSION IMAGING 01/03/2021  Narrative   The study is normal. The study is low risk.   No ST deviation was noted.   Left ventricular function is normal. Nuclear  stress EF: 67 %. The left ventricular ejection fraction is hyperdynamic (>65%). End diastolic cavity size is normal.   Prior study available for comparison from 05/16/2014.   ECHOCARDIOGRAM  ECHOCARDIOGRAM COMPLETE 12/16/2021  Narrative ECHOCARDIOGRAM REPORT    [name] Date of Exam: 12/16/2021 Medical Rec #:  [id number]       Height:       68.0 in Accession #:    [id number]       Weight:       194.0 lb Date of Birth:  [birth year]       BSA:          2.017 m Patient Age:    88 years        BP:           130/60 mmHg Patient Gender: M               HR:           55 bpm. Exam Location:  Outpatient  Procedure: 2D Echo, 3D Echo, Cardiac Doppler, Color Doppler and Strain Analysis  Indications:     Leg Edema  History:         Patient has prior history of Echocardiogram examinations, most recent 02/08/2020. CAD, Prior CABG, Arrythmias:Atrial Fibrillation; Risk Factors:Hypertension and Former Smoker.  Sonographer:     Orvil Holmes RDCS Referring Phys:  [id number] Rashad Obeid Diagnosing Phys: Wilbert Bihari MD  IMPRESSIONS   1. Left ventricular ejection fraction, by estimation, is 50 to 55%. Left ventricular ejection fraction by 3D volume is 54 %. The left ventricle has low normal function. The left ventricle has no regional wall motion abnormalities. There is moderate left ventricular hypertrophy of the basal-septal segment. Left ventricular diastolic parameters are indeterminate. Elevated left ventricular end-diastolic pressure. The average left ventricular global longitudinal strain is -15.9 %. The global longitudinal strain is abnormal. 2. Right ventricular systolic function is normal. The right ventricular size is normal. A Prominent moderator band is visualized. There is mildly elevated pulmonary artery systolic pressure. The estimated right ventricular systolic pressure is 41.6 mmHg. 3. The mitral valve is degenerative. Mild mitral valve regurgitation. No evidence of mitral stenosis. Moderate mitral annular calcification. 4. The aortic valve is tricuspid. There is moderate calcification of the aortic valve. There is moderate thickening of the aortic valve. Aortic valve regurgitation is not visualized. Mild to moderate aortic valve stenosis. Aortic valve area, by VTI measures 1.29 cm. Aortic valve mean gradient measures 10.0 mmHg. Aortic valve Vmax measures 2.12 m/s. 5.  The inferior vena cava is dilated in size with <50% respiratory variability, suggesting right atrial pressure of 15 mmHg. 6. Compared to echo 02/08/20, mean AV gradient is unchanged but AVA by VTI has decreased from 1.42cm2 to 1.29cm2 and DVI has decreased form 0.56 to 0.41. LVF also has decreased from 60-65% now to 50-55% and mildly abnormal LV strain (not done on prior echo).  FINDINGS Left Ventricle: Left ventricular ejection fraction, by estimation, is 50 to 55%. Left ventricular ejection fraction by 3D volume is 54 %. The left ventricle has low normal function. The left ventricle has no regional wall motion abnormalities. The average left ventricular global longitudinal strain is -15.9 %. The global longitudinal strain is abnormal. The left ventricular internal cavity size was normal in size. There is moderate left ventricular hypertrophy of the basal-septal segment. Left ventricular diastolic function could not be evaluated due to atrial fibrillation. Left ventricular diastolic parameters are indeterminate. Elevated left  ventricular end-diastolic pressure.  Right Ventricle: The right ventricular size is normal. No increase in right ventricular wall thickness. Right ventricular systolic function is normal. There is mildly elevated pulmonary artery systolic pressure. The tricuspid regurgitant velocity is 2.58 m/s, and with an assumed right atrial pressure of 15 mmHg, the estimated right ventricular systolic pressure is 41.6 mmHg.  Left Atrium: Left atrial size was normal in size.  Right Atrium: Right atrial size was normal in size.  Pericardium: There is no evidence of pericardial effusion.  Mitral Valve: The mitral valve is degenerative in appearance. There is mild calcification of the mitral valve leaflet(s). Moderate mitral annular calcification. Mild mitral valve regurgitation. No evidence of mitral valve stenosis.  Tricuspid Valve: The tricuspid valve is normal in structure. Tricuspid  valve regurgitation is mild . No evidence of tricuspid stenosis.  Aortic Valve: The aortic valve is tricuspid. There is moderate calcification of the aortic valve. There is moderate thickening of the aortic valve. Aortic valve regurgitation is not visualized. Mild to moderate aortic stenosis is present. Aortic valve mean gradient measures 10.0 mmHg. Aortic valve peak gradient measures 18.0 mmHg. Aortic valve area, by VTI measures 1.29 cm.  Pulmonic Valve: The pulmonic valve was normal in structure. Pulmonic valve regurgitation is not visualized. No evidence of pulmonic stenosis.  Aorta: The aortic root is normal in size and structure.  Venous: The inferior vena cava is dilated in size with less than 50% respiratory variability, suggesting right atrial pressure of 15 mmHg.  IAS/Shunts: No atrial level shunt detected by color flow Doppler.  Additional Comments: A Prominent moderator band is visualized.   LEFT VENTRICLE PLAX 2D LVIDd:         4.75 cm         Diastology LVIDs:         3.51 cm         LV e' medial:    4.43 cm/s LV PW:         1.16 cm         LV E/e' medial:  35.2 LV IVS:        1.41 cm         LV e' lateral:   5.81 cm/s LVOT diam:     2.00 cm         LV E/e' lateral: 26.9 LV SV:         63 LV SV Index:   31              2D LVOT Area:     3.14 cm        Longitudinal Strain 2D Strain GLS  -14.4 % (A2C): 2D Strain GLS  -13.9 % (A3C): 2D Strain GLS  -19.4 % (A4C): 2D Strain GLS  -15.9 % Avg:  3D Volume EF LV 3D EF:    Left ventricul ar ejection fraction by 3D volume is 54 %.  3D Volume EF: 3D EF:        54 % LV EDV:       177 ml LV ESV:       82 ml LV SV:        95 ml  RIGHT VENTRICLE RV Basal diam:  3.84 cm RV Mid diam:    3.15 cm RV S prime:     11.50 cm/s TAPSE (M-mode): 2.4 cm  LEFT ATRIUM             Index        RIGHT ATRIUM  Index LA diam:        4.50 cm 2.23 cm/m   RA Area:     17.60 cm LA Vol (A2C):   41.5 ml 20.57 ml/m  RA  Volume:   52.70 ml  26.12 ml/m LA Vol (A4C):   63.9 ml 31.67 ml/m LA Biplane Vol: 55.2 ml 27.36 ml/m AORTIC VALVE AV Area (Vmax):    1.16 cm AV Area (Vmean):   1.17 cm AV Area (VTI):     1.29 cm AV Vmax:           212.00 cm/s AV Vmean:          141.000 cm/s AV VTI:            0.490 m AV Peak Grad:      18.0 mmHg AV Mean Grad:      10.0 mmHg LVOT Vmax:         78.10 cm/s LVOT Vmean:        52.500 cm/s LVOT VTI:          0.201 m LVOT/AV VTI ratio: 0.41  AORTA Ao Root diam: 3.00 cm Ao Asc diam:  3.30 cm  MITRAL VALVE                TRICUSPID VALVE MV Area (PHT): 2.80 cm     TR Peak grad:   26.6 mmHg MV Decel Time: 271 msec     TR Vmax:        258.00 cm/s MV E velocity: 156.00 cm/s MV A velocity: 63.00 cm/s   SHUNTS MV E/A ratio:  2.48         Systemic VTI:  0.20 m Systemic Diam: 2.00 cm  Wilbert Bihari MD Electronically signed by Wilbert Bihari MD Signature Date/Time: 12/16/2021/2:24:40 PM    Final (Updated)          ______________________________________________________________________________________________      Risk Assessment/Calculations          Physical Exam VS:  BP (!) 162/54   Pulse 62   Ht 5' 8 (1.727 m)   SpO2 96%   BMI 29.44 kg/m    Wt Readings from Last 3 Encounters:  03/31/23 193 lb 9.6 oz (87.8 kg)  12/30/22 189 lb (85.7 kg)  08/20/22 178 lb (80.7 kg)    GEN: Well nourished, well developed in no acute distress NECK: No JVD; No carotid bruits CARDIAC: RRR, no murmurs, rubs, gallops RESPIRATORY:  Clear to auscultation without rales, wheezing or rhonchi  ABDOMEN: Soft, non-tender, non-distended EXTREMITIES:  No edema; No deformity   ASSESSMENT AND PLAN  CAD s/p CABG: Denies any recent chest pain.   Aortic valve insufficiency: Repeat echocardiogram this year for history of mild to moderate AI  Hypertension: Blood pressure stable  Permanent atrial fibrillation: Patient is still going in and out of atrial fibrillation at this  time, however previously the decision has been made to pursue rate control strategy only.  No plan for cardioversion.  Continue Eliquis .  NASH cirrhosis: Followed by GI service.        Dispo: Follow-up with Dr. Mona in 6 months  Signed, Breeana Sawtelle, [place]

## 2023-08-17 ENCOUNTER — Telehealth: Payer: Self-pay | Admitting: Internal Medicine

## 2023-08-17 NOTE — Telephone Encounter (Signed)
 Patient calling to speak to the nurse about getting assistant. Please advise

## 2023-08-17 NOTE — Telephone Encounter (Signed)
 Attempted to contact pt to discuss what kind of assistance he is in need of.  No answer and unable to leave message.

## 2023-09-02 ENCOUNTER — Encounter: Payer: Self-pay | Admitting: Internal Medicine

## 2023-09-04 ENCOUNTER — Ambulatory Visit (HOSPITAL_COMMUNITY)
Admission: RE | Admit: 2023-09-04 | Discharge: 2023-09-04 | Disposition: A | Discharge status: Home / Self Care | Source: Ambulatory Visit | Attending: Cardiology | Admitting: Cardiology

## 2023-09-04 DIAGNOSIS — I351 Nonrheumatic aortic (valve) insufficiency: Secondary | ICD-10-CM | POA: Insufficient documentation

## 2023-09-04 LAB — ECHOCARDIOGRAM COMPLETE
AR max vel: 1.13 cm2
AV Area VTI: 1.17 cm2
AV Area mean vel: 1.11 cm2
AV Mean grad: 10.6 mmHg
AV Peak grad: 18 mmHg
Ao pk vel: 2.12 m/s
Area-P 1/2: 2.12 cm2
MV VTI: 1.46 cm2
S' Lateral: 3.7 cm

## 2023-09-07 ENCOUNTER — Ambulatory Visit: Payer: Self-pay | Admitting: Physician Assistant

## 2023-09-07 NOTE — Progress Notes (Signed)
 Reviewed with Dr. Mona, pumping function of heart borderline low. Please arrange a sooner follow up in the next 2-3 weeks to titrate medication.

## 2023-09-15 NOTE — Telephone Encounter (Signed)
 Called patient to ensure he has been helped. Patient's inquiry was about assistance with making a payment and he was able to take care of payment via phone call. No other questions at this time.   Josie RN

## 2023-09-25 NOTE — Progress Notes (Unsigned)
 Cardiology Clinic Note   Patient Name: [name] Date of Encounter: 09/28/2023  Primary Care Provider:  Patient, No Pcp Per Primary Cardiologist:  Vinie JAYSON Maxcy, MD  Patient Profile    [name] 89 year old male presents the clinic today for review of his echocardiogram and medication up titration.  Past Medical History    Past Medical History:  Diagnosis Date   BPH (benign prostatic hyperplasia)    CAD (coronary artery disease) of artery bypass graft 02/04/2004   Fatty liver    GERD (gastroesophageal reflux disease)    Gilbert's syndrome    Hiatal hernia    History of nuclear stress test 11/04/2007   bruce myoview ; normal pattern of persuion in all regions; post-stress EF 70%; low risk    HOH (hard of hearing)    HTN (hypertension)    Neuropathy    PAF (paroxysmal atrial fibrillation) (HCC)    PVD (peripheral vascular disease) (HCC)    0-49% R & L ICA stenosis (2013)    Renal artery aneurysm (HCC)    S/P CABG (coronary artery bypass graft) 02/04/2004   Schatzki's ring    non critical   Throat cancer (HCC)    Thrombocytopenia (HCC)    Past Surgical History:  Procedure Laterality Date   CARDIOVERSION N/A 01/14/2018   Procedure: CARDIOVERSION;  Surgeon: Loni Soyla LABOR, MD;  Location: [facility] ENDOSCOPY;  Service: Cardiovascular;  Laterality: N/A;   CATARACT EXTRACTION  2011   COLONOSCOPY  2006   Dr. Regina pancolonic diverticula   COLONOSCOPY  01/07/2012   Dr. Newman normal rectum, pancolonic diverticulosis, hyperplastic polyp   COLONOSCOPY N/A 03/20/2015   MFM:ejwrnonwpr diverticulosis   CORONARY ARTERY BYPASS GRAFT  04/2004   LIMA to diagonal, SVG to ramus intermedius, SVG to PDA; placed stent for crossed coronary arteries (Dr. Fleeta Ochoa)   ESOPHAGEAL BANDING N/A 08/20/2022   Procedure: ESOPHAGEAL BANDING;  Surgeon: Shaaron Lamar HERO, MD;  Location: [facility] ENDO SUITE;  Service: Endoscopy;  Laterality: N/A;   ESOPHAGOGASTRODUODENOSCOPY   2006   Dr. Fuller Schatzki's ring, s/p 58-F Maloney dilation   ESOPHAGOGASTRODUODENOSCOPY  01/07/2012   Dr. Stephenie ring, hiatal hernia, fundic gland polyp   ESOPHAGOGASTRODUODENOSCOPY N/A 03/20/2015   MFM:hmjiz 1 varices/HH Gastric polpy s/p bx   ESOPHAGOGASTRODUODENOSCOPY N/A 07/28/2018   Dr. Shaaron: 3 short columns of grade 1 esophageal varices.  Esophageal dilation performed for dysphagia.  Multiple gastric polyps previously proved benign.   ESOPHAGOGASTRODUODENOSCOPY (EGD) WITH PROPOFOL  N/A 08/20/2022   Procedure: ESOPHAGOGASTRODUODENOSCOPY (EGD) WITH PROPOFOL ;  Surgeon: Shaaron Lamar HERO, MD;  Location: [facility] ENDO SUITE;  Service: Endoscopy;  Laterality: N/A;  11:00 am, asa 3   MALONEY DILATION N/A 07/28/2018   Procedure: MALONEY DILATION;  Surgeon: Shaaron Lamar HERO, MD;  Location: [facility] ENDO SUITE;  Service: Endoscopy;  Laterality: N/A;   MALONEY DILATION N/A 08/20/2022   Procedure: AGAPITO DILATION;  Surgeon: Shaaron Lamar HERO, MD;  Location: [facility] ENDO SUITE;  Service: Endoscopy;  Laterality: N/A;   rotater cuff Left    repair   TONSILLECTOMY      Allergies  Allergies  Allergen Reactions   Antihistamines, Chlorpheniramine-Type Rash    Patient states he cant take large doses of antihistamines also causes prostate problems    History of Present Illness    [name] has a PMH of coronary artery disease status post CABG 2006, HTN, HLD, PAD, NASH cirrhosis and paroxysmal atrial fibrillation.  His PMH also includes mild carotid artery disease.  He  had nuclear stress testing 4/16 which showed an EF of 58% and normal stress testing.  Echocardiogram 12/19 showed an EF of 55 to 60% moderate LVH.  He was placed on Coumadin for atrial fibrillation and later switched to Xarelto .  He was transition Sayvasa and eventually transition to Eliquis  at 5 mg twice daily.  He underwent DCCV 12/19.  He was seen in follow-up on 5/21.  During that time he was seen by GI for his Hollie cirrhosis  with stage I esophageal varices.  He was diagnosed with throat cancer 2021 and underwent partial resection and radiation therapy.  PET scan showed evidence of metastasis.  His echocardiogram 1/22 showed an EF of 60-65%, G2 DD, trivial mitral valve regurgitation, mild aortic stenosis.  Repeat stress testing 12/22 was negative for ischemia and showed an EF of 67%.  He was back in rate controlled atrial fibrillation 10/23.  Due to his lack of symptoms it was decided to focus on rate control.  Echocardiogram 11/23 showed an EF of 50-55%, no regional wall motion abnormalities, moderate LVH of basal septal segment, RV systolic function was normal.  He was noted to have mild MR, mild-moderate AI.  He was seen in follow-up by Scot Ford PA-C on 07/23/2023.  During that time he was back in sinus rhythm.  He reported that he was cardiac unaware.  He denied chest pain or shortness of breath.  Echocardiogram was repeated and showed an LVEF of 45-50%, mild concentric LVH, intermediate diastolic parameters, moderately dilated left atria, mild mitral regurgitation.  He presents to the clinic today to review his echocardiogram and for medication up titration.  He states he notices shortness of breath with increased activity but has normal breathing with normal activity.  He remains somewhat physically active gardening, golfing, and doing housework.  His blood pressure today is initially 156/56 and on recheck is 146/60.  We reviewed his echocardiogram.  He expressed understanding.  I will discontinue his losartan and start Entresto  twice daily.  We will repeat BMP in 1 week.  Will plan follow-up in 1 month..  Today he denies chest pain, shortness of breath, lower extremity edema, fatigue, palpitations, melena, hematuria, hemoptysis, diaphoresis, weakness, presyncope, syncope, orthopnea, and PND.    Home Medications    Prior to Admission medications   Medication Sig Start Date End Date Taking? Authorizing Provider   allopurinol (ZYLOPRIM) 100 MG tablet Take 100 mg by mouth at bedtime.     [provider]  amLODipine  (NORVASC ) 5 MG tablet Take 1 tablet (5 mg total) by mouth daily. 02/07/22   Hilty, Vinie BROCKS, MD  apixaban  (ELIQUIS ) 5 MG TABS tablet Take 1 tablet (5 mg total) by mouth 2 (two) times daily. 01/24/16   Hilty, Vinie BROCKS, MD  betamethasone valerate (VALISONE) 0.1 % cream Apply topically 2 (two) times daily.    [provider]  Ensure Plus (ENSURE PLUS) LIQD Take 237 mLs by mouth.    [provider]  finasteride (PROSCAR) 5 MG tablet Take 5 mg by mouth at bedtime.     [provider]  fluticasone  (FLONASE ) 50 MCG/ACT nasal spray Place 1 spray into both nostrils 2 (two) times a day.    [provider]  furosemide  (LASIX ) 40 MG tablet Take 1 tablet by mouth once daily 09/12/22   Hilty, Vinie BROCKS, MD  gabapentin  (NEURONTIN ) 300 MG capsule Take 300 mg by mouth 3 (three) times daily.      [provider]  Levothyroxine Sodium 112  MCG CAPS Take 112 mcg by mouth daily before breakfast.     [provider]  losartan (COZAAR) 100 MG tablet Take 50 mg by mouth daily.    [provider]  metoprolol  tartrate (LOPRESSOR ) 25 MG tablet Take 1 tablet by mouth twice daily 03/17/23   Hilty, Vinie BROCKS, MD  Misc Natural Products (BEET ROOT PO) Take by mouth.    [provider]  Multiple Vitamins-Minerals (PRESERVISION AREDS 2+MULTI VIT) CAPS Take 1 capsule by mouth 2 (two) times daily.    [provider]  nitroGLYCERIN  (NITROSTAT ) 0.4 MG SL tablet DISSOLVE ONE TABLET UNDER THE TONGUE EVERY 5 MINUTES AS NEEDED FOR CHEST PAIN.  DO NOT EXCEED A TOTAL OF 3 DOSES IN 15 MINUTES 12/26/22   Hilty, Vinie BROCKS, MD  RABEprazole  (ACIPHEX ) 20 MG tablet Take 1 tablet (20 mg total) by mouth 2 (two) times daily. Patient taking differently: Take 20 mg by mouth in the morning and at bedtime. 09/10/18   Rourk, Lamar HERO, MD  sildenafil (VIAGRA) 100 MG tablet  Take 100 mg by mouth daily as needed for erectile dysfunction.    [provider]  tamsulosin (FLOMAX) 0.4 MG CAPS capsule Take 0.8 mg by mouth at bedtime.    [provider]  vitamin B-12 (CYANOCOBALAMIN) 500 MCG tablet Take 500 mcg by mouth daily.    [provider]    Family History    Family History  Problem Relation Age of Onset   Heart attack Mother 72       deceased   Heart attack Father 38       deceased   Heart disease Brother        x2   Diabetes Brother        x3   Colon cancer Neg Hx    He indicated that the status of his mother is unknown. He indicated that the status of his father is unknown. He indicated that the status of his neg hx is unknown.  Social History    Social History   Socioeconomic History   Marital status: Married    Spouse name: Not on file   Number of children: Not on file   Years of education: Not on file   Highest education level: Not on file  Occupational History   Not on file  Tobacco Use   Smoking status: Former    Current packs/day: 0.00    Average packs/day: 1 pack/day for 40.0 years (40.0 ttl pk-yrs)    Types: Cigarettes    Start date: 04/07/1964    Quit date: 04/07/2004    Years since quitting: 19.4   Smokeless tobacco: Never  Vaping Use   Vaping status: Never Used  Substance and Sexual Activity   Alcohol use: Yes    Comment: occassional drinker   Drug use: No   Sexual activity: Never  Other Topics Concern   Not on file  Social History Narrative   Not on file   Social Drivers of Health   Financial Resource Strain: Not on file  Food Insecurity: Low Risk  (04/09/2023)   Received from [facility]   Hunger Vital Sign    Within the past 12 months, you worried that your food would run out before you got money to buy more: Never true    Within the past 12 months, the food you bought just didn't last and you didn't have money to get more. : Never true  Transportation Needs: No Transportation Needs  (  04/09/2023)   Received from [facility]   Transportation    In the past 12 months, has lack of reliable transportation kept you from medical appointments, meetings, work or from getting things needed for daily living? : No  Physical Activity: Not on file  Stress: Not on file  Social Connections: Not on file  Intimate Partner Violence: Not on file     Review of Systems    General:  No chills, fever, night sweats or weight changes.  Cardiovascular:  No chest pain, dyspnea on exertion, edema, orthopnea, palpitations, paroxysmal nocturnal dyspnea. Dermatological: No rash, lesions/masses Respiratory: No cough, dyspnea Urologic: No hematuria, dysuria Abdominal:   No nausea, vomiting, diarrhea, bright red blood per rectum, melena, or hematemesis Neurologic:  No visual changes, wkns, changes in mental status. All other systems reviewed and are otherwise negative except as noted above.  Physical Exam    VS:  BP (!) 146/60   Pulse 79   Ht 5' 8 (1.727 m)   Wt 185 lb 3.2 oz (84 kg)   SpO2 95%   BMI 28.16 kg/m  , BMI Body mass index is 28.16 kg/m. GEN: Well nourished, well developed, in no acute distress. HEENT: normal. Neck: Supple, no JVD, carotid bruits, or masses. Cardiac: RRR, 3-6 systolic murmur heard along left sternal border, rubs, or gallops. No clubbing, cyanosis, edema.  Radials/DP/PT 2+ and equal bilaterally.  Respiratory:  Respirations regular and unlabored, clear to auscultation bilaterally. GI: Soft, nontender, nondistended, BS + x 4. MS: no deformity or atrophy. Skin: warm and dry, no rash. Neuro:  Strength and sensation are intact. Psych: Normal affect.  Accessory Clinical Findings    Recent Labs: 03/31/2023: ALT 6; BUN 15; Creatinine, Ser 1.38; Hemoglobin 9.4; Platelets 146; Potassium 4.6; Sodium 141   Recent Lipid Panel    Component Value Date/Time   CHOL 117 05/10/2014 0955   TRIG 60 05/10/2014 0955   HDL 55 05/10/2014 0955   CHOLHDL 2.4 11/07/2007 0540    VLDL 8 11/07/2007 0540   LDLCALC 50 05/10/2014 0955    HYPERTENSION CONTROL Vitals:   09/28/23 0907 09/28/23 0931  BP: (!) 156/56 (!) 146/60    The patient's blood pressure is elevated above target today.  In order to address the patient's elevated BP: Blood pressure will be monitored at home to determine if medication changes need to be made.; A new medication was prescribed today.       ECG personally reviewed by me today-none today.    Echocardiogram 09/04/2023  IMPRESSIONS     1. Left ventricular ejection fraction, by estimation, is 45 to 50%. The  left ventricle has mildly decreased function. The left ventricle  demonstrates global hypokinesis. There is mild concentric left ventricular  hypertrophy. Left ventricular diastolic  parameters are indeterminate.   2. Right ventricular systolic function is normal. The right ventricular  size is mildly enlarged. There is mildly elevated pulmonary artery  systolic pressure. The estimated right ventricular systolic pressure is  42.8 mmHg.   3. Left atrial size was moderately dilated.   4. The mitral valve is degenerative. Mild mitral valve regurgitation. No  evidence of mitral stenosis. Moderate mitral annular calcification.   5. The aortic valve is tricuspid. There is moderate calcification of the  aortic valve. Aortic valve regurgitation is not visualized. Moderate  aortic valve stenosis. Aortic valve area, by VTI measures 1.17 cm. Aortic  valve mean gradient measures 10.6  mmHg. Aortic valve Vmax measures 2.12 m/s.   6. The  inferior vena cava is normal in size with <50% respiratory  variability, suggesting right atrial pressure of 8 mmHg.   FINDINGS   Left Ventricle: Left ventricular ejection fraction, by estimation, is 45  to 50%. The left ventricle has mildly decreased function. The left  ventricle demonstrates global hypokinesis. The left ventricular internal  cavity size was normal in size. There is   mild concentric  left ventricular hypertrophy. Left ventricular diastolic  parameters are indeterminate.   Right Ventricle: The right ventricular size is mildly enlarged. No  increase in right ventricular wall thickness. Right ventricular systolic  function is normal. There is mildly elevated pulmonary artery systolic  pressure. The tricuspid regurgitant  velocity is 2.95 m/s, and with an assumed right atrial pressure of 8 mmHg,  the estimated right ventricular systolic pressure is 42.8 mmHg.   Left [facility]: Left atrial size was moderately dilated.   Right [facility]: Right atrial size was normal in size.   Pericardium: There is no evidence of pericardial effusion.   The mitral valve is degenerative in appearance. There is mild  calcification of the mitral valve leaflet(s). Moderate mitral annular  calcification. Mild mitral valve regurgitation. No evidence of mitral  valve stenosis.  Tricuspid Valve: The tricuspid valve is normal in structure. Tricuspid  valve regurgitation is mild . No evidence of tricuspid stenosis.   The aortic valve is tricuspid. There is moderate calcification of the  aortic valve. Aortic valve regurgitation is not visualized. Moderate  aortic stenosis is present.  Pulmonic Valve: The pulmonic valve was normal in structure. Pulmonic valve  regurgitation is trivial. No evidence of pulmonic stenosis.   Aorta: The aortic root is normal in size and structure.   Venous: The inferior vena cava is normal in size with less than 50%  respiratory variability, suggesting right atrial pressure of 8 mmHg.   IAS/Shunts: No atrial level shunt detected by color flow Doppler.        Assessment & Plan   1.  HFrEF-no increased dyspnea on exertion or activity intolerance.  Euvolemic.  Weight today 185.2 lbs.  Echocardiogram 09/04/2023 showed an LVEF of 45-50%. Daily weights Heart healthy low-sodium diet Increase physical activity as tolerated Continue amlodipine , furosemide ,  metoprolol  Stop losartan Start Entresto  49/51 Repeat BMP in 1 week  Essential hypertension-BP today 146/60. Maintain blood pressure log Continue amlodipine , metoprolol  Start Entresto   Paroxysmal atrial fibrillation-heart rate today 79 bpm.  Continue rate control therapy.  Denies bleeding issues. Avoid triggers caffeine, chocolate, EtOH, dehydration etc. Continue apixaban , metoprolol   Disposition: Follow-up with Dr. Mona, Hao Meng PA-C he or me in 1 month.   Josefa HERO. Persais Ethridge NP-C     09/28/2023, 9:40 AM [facility] [address] [place], [place] [address] Office [phone]      I spent 14 minutes examining this patient, reviewing medications, and using patient centered shared decision making involving their cardiac care.   I spent  20 minutes reviewing past medical history,  medications, and prior cardiac tests.

## 2023-09-28 ENCOUNTER — Encounter: Payer: Self-pay | Admitting: General Practice

## 2023-09-28 ENCOUNTER — Ambulatory Visit: Attending: General Practice | Admitting: General Practice

## 2023-09-28 VITALS — BP 146/60 | HR 79 | Ht 68.0 in | Wt 185.2 lb

## 2023-09-28 DIAGNOSIS — I480 Paroxysmal atrial fibrillation: Secondary | ICD-10-CM

## 2023-09-28 DIAGNOSIS — I5020 Unspecified systolic (congestive) heart failure: Secondary | ICD-10-CM | POA: Diagnosis not present

## 2023-09-28 DIAGNOSIS — I10 Essential (primary) hypertension: Secondary | ICD-10-CM

## 2023-09-28 MED ORDER — SACUBITRIL-VALSARTAN 49-51 MG PO TABS: 1.0000 | ORAL_TABLET | Freq: Two times a day (BID) | ORAL | 3 refills | Status: AC

## 2023-09-28 NOTE — Patient Instructions (Addendum)
 Medication Instructions:  Start Entresto  49-51 twice a day Stop Losartan as directed by your provider *If you need a refill on your cardiac medications before your next appointment, please call your pharmacy*  Lab Work: [facility] in 2 weeks If you have labs (blood work) drawn today and your tests are completely normal, you will receive your results only by: MyChart Message (if you have MyChart) OR A paper copy in the mail If you have any lab test that is abnormal or we need to change your treatment, we will call you to review the results.  Testing/Procedures: none  Follow-Up: At [facility], you and your health needs are our priority.  As part of our continuing mission to provide you with exceptional heart care, our providers are all part of one team.  This team includes your primary Cardiologist (physician) and Advanced Practice Providers or APPs (Physician Assistants and Nurse Practitioners) who all work together to provide you with the care you need, when you need it.  Your next appointment:   1 month  Provider:   Josefa Beauvais  We recommend signing up for the patient portal called MyChart.  Sign up information is provided on this After Visit Summary.  MyChart is used to connect with patients for Virtual Visits (Telemedicine).  Patients are able to view lab/test results, encounter notes, upcoming appointments, etc.  Non-urgent messages can be sent to your provider as well.   To learn more about what you can do with MyChart, go to [email or web address].   Other Instructions   Continue physical Activity as directed by your provider [facility]:

## 2023-10-08 ENCOUNTER — Ambulatory Visit: Admitting: Physician Assistant

## 2023-10-12 ENCOUNTER — Other Ambulatory Visit: Payer: Self-pay

## 2023-10-12 DIAGNOSIS — I10 Essential (primary) hypertension: Secondary | ICD-10-CM

## 2023-10-13 ENCOUNTER — Ambulatory Visit: Payer: Self-pay | Admitting: General Practice

## 2023-10-13 LAB — BASIC METABOLIC PANEL WITH GFR
BUN/Creatinine Ratio: 11 (ref 10–24)
BUN: 15 mg/dL (ref 8–27)
CO2: 19 mmol/L — ABNORMAL LOW (ref 20–29)
Calcium: 8.9 mg/dL (ref 8.6–10.2)
Chloride: 101 mmol/L (ref 96–106)
Creatinine, Ser: 1.31 mg/dL — ABNORMAL HIGH (ref 0.76–1.27)
Glucose: 102 mg/dL — ABNORMAL HIGH (ref 70–99)
Potassium: 4.2 mmol/L (ref 3.5–5.2)
Sodium: 136 mmol/L (ref 134–144)
eGFR: 52 mL/min/1.73 — ABNORMAL LOW (ref >59)

## 2023-10-20 NOTE — Progress Notes (Unsigned)
 Cardiology Clinic Note   Patient Name: [name] Date of Encounter: 10/27/2023  Primary Care Provider:  Patient, No Pcp Per Primary Cardiologist:  Vinie JAYSON Maxcy, MD  Patient Profile    [name] 89 year old male presents the clinic today for follow-up evaluation of his HFrEF and medication management.  Past Medical History    Past Medical History:  Diagnosis Date   BPH (benign prostatic hyperplasia)    CAD (coronary artery disease) of artery bypass graft 02/04/2004   Fatty liver    GERD (gastroesophageal reflux disease)    Gilbert's syndrome    Hiatal hernia    History of nuclear stress test 11/04/2007   bruce myoview ; normal pattern of persuion in all regions; post-stress EF 70%; low risk    HOH (hard of hearing)    HTN (hypertension)    Neuropathy    PAF (paroxysmal atrial fibrillation) (HCC)    PVD (peripheral vascular disease)    0-49% R & L ICA stenosis (2013)    Renal artery aneurysm    S/P CABG (coronary artery bypass graft) 02/04/2004   Schatzki's ring    non critical   Throat cancer (HCC)    Thrombocytopenia    Past Surgical History:  Procedure Laterality Date   CARDIOVERSION N/A 01/14/2018   Procedure: CARDIOVERSION;  Surgeon: Loni Soyla LABOR, MD;  Location: [facility] ENDOSCOPY;  Service: Cardiovascular;  Laterality: N/A;   CATARACT EXTRACTION  2011   COLONOSCOPY  2006   Dr. Regina pancolonic diverticula   COLONOSCOPY  01/07/2012   Dr. Newman normal rectum, pancolonic diverticulosis, hyperplastic polyp   COLONOSCOPY N/A 03/20/2015   MFM:ejwrnonwpr diverticulosis   CORONARY ARTERY BYPASS GRAFT  04/2004   LIMA to diagonal, SVG to ramus intermedius, SVG to PDA; placed stent for crossed coronary arteries (Dr. Fleeta Ochoa)   ESOPHAGEAL BANDING N/A 08/20/2022   Procedure: ESOPHAGEAL BANDING;  Surgeon: Shaaron Lamar HERO, MD;  Location: [facility] ENDO SU[facility];  Service: Endoscopy;  Laterality: N/A;   ESOPHAGOGASTRODUODENOSCOPY  2006   Dr.  Fuller Schatzki's ring, s/p 58-F Maloney dilation   ESOPHAGOGASTRODUODENOSCOPY  01/07/2012   Dr. Stephenie ring, hiatal hernia, fundic gland polyp   ESOPHAGOGASTRODUODENOSCOPY N/A 03/20/2015   MFM:hmjiz 1 varices/HH Gastric polpy s/p bx   ESOPHAGOGASTRODUODENOSCOPY N/A 07/28/2018   Dr. Shaaron: 3 short columns of grade 1 esophageal varices.  Esophageal dilation performed for dysphagia.  Multiple gastric polyps previously proved benign.   ESOPHAGOGASTRODUODENOSCOPY (EGD) WITH PROPOFOL  N/A 08/20/2022   Procedure: ESOPHAGOGASTRODUODENOSCOPY (EGD) WITH PROPOFOL ;  Surgeon: Shaaron Lamar HERO, MD;  Location: [facility];  Service: Endoscopy;  Laterality: N/A;  11:00 am, asa 3   MALONEY DILATION N/A 07/28/2018   Procedure: MALONEY DILATION;  Surgeon: Shaaron Lamar HERO, MD;  Location: [facility];  Service: Endoscopy;  Laterality: N/A;   MALONEY DILATION N/A 08/20/2022   Procedure: AGAPITO DILATION;  Surgeon: Shaaron Lamar HERO, MD;  Location: [facility];  Service: Endoscopy;  Laterality: N/A;   rotater cuff Left    repair   TONSILLECTOMY      Allergies  Allergies  Allergen Reactions   Antihistamines, Chlorpheniramine-Type Rash    Patient states he cant take large doses of antihistamines also causes prostate problems    History of Present Illness    [name] has a PMH of coronary artery disease status post CABG 2006, HTN, HLD, PAD, NASH cirrhosis and paroxysmal atrial fibrillation.  His PMH also includes mild carotid artery disease.  He had nuclear stress  testing 4/16 which showed an EF of 58% and normal stress testing.  Echocardiogram 12/19 showed an EF of 55 to 60% moderate LVH.  He was placed on Coumadin for atrial fibrillation and later switched to Xarelto .  He was transition Sayvasa and eventually transition to Eliquis  at 5 mg twice daily.  He underwent DCCV 12/19.  He was seen in follow-up on 5/21.  During that time he was seen by GI for his Hollie cirrhosis with stage I  esophageal varices.  He was diagnosed with throat cancer 2021 and underwent partial resection and radiation therapy.  PET scan showed evidence of metastasis.  His echocardiogram 1/22 showed an EF of 60-65%, G2 DD, trivial mitral valve regurgitation, mild aortic stenosis.  Repeat stress testing 12/22 was negative for ischemia and showed an EF of 67%.  He was back in rate controlled atrial fibrillation 10/23.  Due to his lack of symptoms it was decided to focus on rate control.  Echocardiogram 11/23 showed an EF of 50-55%, no regional wall motion abnormalities, moderate LVH of basal septal segment, RV systolic function was normal.  He was noted to have mild MR, mild-moderate AI.  He was seen in follow-up by Scot Ford PA-C on 07/23/2023.  During that time he was back in sinus rhythm.  He reported that he was cardiac unaware.  He denied chest pain or shortness of breath.  Echocardiogram was repeated and showed an LVEF of 45-50%, mild concentric LVH, intermediate diastolic parameters, moderately dilated left atria, mild mitral regurgitation.  He presented to the 09/28/2023  to review his echocardiogram and for medication up titration.  He stated he noticed shortness of breath with increased activity but had normal breathing with normal activity.  He remained somewhat physically active gardening, golfing, and doing housework.  His blood pressure was initially 156/56 and on recheck was 146/60.  We reviewed his echocardiogram.  He expressed understanding.  I discontinued his losartan and started Entresto  twice daily.  His follow-up lab work was reassuring.  He presents to the clinic today for follow-up evaluation and states he is doing better.  He notices that his breathing has improved.  His blood pressure is much better controlled.  His blood pressure today is 99/70.  He does note that the Entresto  was much more expensive than his losartan.  He contacted the [facility] and has managed this on his own.  He also notes that [facility]  [facility] so [facility].  He reports that he needs some help with this.  I will place a social work consult for this.  He continues to be fairly physically active.  We reviewed his recent lab work.  He expressed understanding.  I will plan to repeat his echocardiogram in November and plan follow-up in 4 to 6 months..  Today he denies chest pain, shortness of breath, lower extremity edema, fatigue, palpitations, melena, hematuria, hemoptysis, diaphoresis, weakness, presyncope, syncope, orthopnea, and PND.    Home Medications    Prior to Admission medications   Medication Sig Start Date End Date Taking? Authorizing Provider  allopurinol (ZYLOPRIM) 100 MG tablet Take 100 mg by mouth at bedtime.     [provider]  amLODipine  (NORVASC ) 5 MG tablet Take 1 tablet (5 mg total) by mouth daily. 02/07/22   Hilty, Vinie BROCKS, MD  apixaban  (ELIQUIS ) 5 MG TABS tablet Take 1 tablet (5 mg total) by mouth 2 (two) times daily. 01/24/16   Hilty, Vinie BROCKS,  MD  betamethasone valerate (VALISONE) 0.1 % cream Apply topically 2 (two) times daily.    [provider]  Ensure Plus (ENSURE PLUS) LIQD Take 237 mLs by mouth.    [provider]  finasteride (PROSCAR) 5 MG tablet Take 5 mg by mouth at bedtime.     [provider]  fluticasone  (FLONASE ) 50 MCG/ACT nasal spray Place 1 spray into both nostrils 2 (two) times a day.    [provider]  furosemide  (LASIX ) 40 MG tablet Take 1 tablet by mouth once daily 09/12/22   Hilty, Vinie BROCKS, MD  gabapentin  (NEURONTIN ) 300 MG capsule Take 300 mg by mouth 3 (three) times daily.      [provider]  Levothyroxine Sodium 112 MCG CAPS Take 112 mcg by mouth daily before breakfast.     [provider]  losartan (COZAAR) 100 MG tablet Take 50 mg by mouth daily.    [provider]  metoprolol  tartrate (LOPRESSOR ) 25 MG tablet Take 1 tablet by mouth twice daily 03/17/23    Hilty, Vinie BROCKS, MD  Misc Natural Products (BEET ROOT PO) Take by mouth.    [provider]  Multiple Vitamins-Minerals (PRESERVISION AREDS 2+MULTI VIT) CAPS Take 1 capsule by mouth 2 (two) times daily.    [provider]  nitroGLYCERIN  (NITROSTAT ) 0.4 MG SL tablet DISSOLVE ONE TABLET UNDER THE TONGUE EVERY 5 MINUTES AS NEEDED FOR CHEST PAIN.  DO NOT EXCEED A TOTAL OF 3 DOSES IN 15 MINUTES 12/26/22   Hilty, Vinie BROCKS, MD  RABEprazole  (ACIPHEX ) 20 MG tablet Take 1 tablet (20 mg total) by mouth 2 (two) times daily. Patient taking differently: Take 20 mg by mouth in the morning and at bedtime. 09/10/18   Rourk, Lamar HERO, MD  sildenafil (VIAGRA) 100 MG tablet Take 100 mg by mouth daily as needed for erectile dysfunction.    [provider]  tamsulosin (FLOMAX) 0.4 MG CAPS capsule Take 0.8 mg by mouth at bedtime.    [provider]  vitamin B-12 (CYANOCOBALAMIN) 500 MCG tablet Take 500 mcg by mouth daily.    [provider]    Family History    Family History  Problem Relation Age of Onset   Heart attack Mother 85       deceased   Heart attack Father 63       deceased   Heart disease Brother        x2   Diabetes Brother        x3   Colon cancer Neg Hx    He indicated that the status of his mother is unknown. He indicated that the status of his father is unknown. He indicated that the status of his neg hx is unknown.  Social History    Social History   Socioeconomic History   Marital status: Married    Spouse name: Not on file   Number of children: Not on file   Years of education: Not on file   Highest education level: Not on file  Occupational History   Not on file  Tobacco Use   Smoking status: Former    Current packs/day: 0.00    Average packs/day: 1 pack/day for 40.0 years (40.0 ttl pk-yrs)    Types: Cigarettes    Start date: 04/07/1964    Quit date: 04/07/2004    Years since quitting: 19.5   Smokeless tobacco: Never  Vaping Use    Vaping status: Never Used  Substance and Sexual Activity  Alcohol use: Yes    Comment: occassional drinker   Drug use: No   Sexual activity: Never  Other Topics Concern   Not on file  Social History Narrative   Not on file   Social Drivers of Health   Financial Resource Strain: Not on file  Food Insecurity: Low Risk  (04/09/2023)   Received from [facility]   Hunger Vital Sign    Within the past 12 months, you worried that your food would run out before you got money to buy more: Never true    Within the past 12 months, the food you bought just didn't last and you didn't have money to get more. : Never true  Transportation Needs: No Transportation Needs (04/09/2023)   Received from [facility]    In the past 12 months, has lack of reliable transportation kept you from medical appointments, meetings, work or from getting things needed for daily living? : No  Physical Activity: Not on file  Stress: Not on file  Social Connections: Not on file  Intimate Partner Violence: Not on file     Review of Systems    General:  No chills, fever, night sweats or weight changes.  Cardiovascular:  No chest pain, dyspnea on exertion, edema, orthopnea, palpitations, paroxysmal nocturnal dyspnea. Dermatological: No rash, lesions/masses Respiratory: No cough, dyspnea Urologic: No hematuria, dysuria Abdominal:   No nausea, vomiting, diarrhea, bright red blood per rectum, melena, or hematemesis Neurologic:  No visual changes, wkns, changes in mental status. All other systems reviewed and are otherwise negative except as noted above.  Physical Exam    VS:  BP 99/70 (BP Location: Left Arm, Patient Position: Sitting)   Pulse 79   Ht 5' 8 (1.727 m)   Wt 184 lb 6.4 oz (83.6 kg)   SpO2 97%   BMI 28.04 kg/m  , BMI Body mass index is 28.04 kg/m. GEN: Well nourished, well developed, in no acute distress. HEENT: normal. Neck: Supple, no JVD, carotid bruits, or  masses. Cardiac: RRR, 3-6 systolic murmur heard along left sternal border, rubs, or gallops. No clubbing, cyanosis, edema.  Radials/DP/PT 2+ and equal bilaterally.  Respiratory:  Respirations regular and unlabored, clear to auscultation bilaterally. GI: Soft, nontender, nondistended, BS + x 4. MS: no deformity or atrophy. Skin: warm and dry, no rash. Neuro:  Strength and sensation are intact. Psych: Normal affect.  Accessory Clinical Findings    Recent Labs: 03/31/2023: ALT 6; Hemoglobin 9.4; Platelets 146 10/12/2023: BUN 15; Creatinine, Ser 1.31; Potassium 4.2; Sodium 136   Recent Lipid Panel    Component Value Date/Time   CHOL 117 05/10/2014 0955   TRIG 60 05/10/2014 0955   HDL 55 05/10/2014 0955   CHOLHDL 2.4 11/07/2007 0540   VLDL 8 11/07/2007 0540   LDLCALC 50 05/10/2014 0955         ECG personally reviewed by me today-none today.    Echocardiogram 09/04/2023  IMPRESSIONS     1. Left ventricular ejection fraction, by estimation, is 45 to 50%. The  left ventricle has mildly decreased function. The left ventricle  demonstrates global hypokinesis. There is mild concentric left ventricular  hypertrophy. Left ventricular diastolic  parameters are indeterminate.   2. Right ventricular systolic function is normal. The right ventricular  size is mildly enlarged. There is mildly elevated pulmonary artery  systolic pressure. The estimated right ventricular systolic pressure is  42.8 mmHg.   3. Left atrial size was moderately dilated.   4.  The mitral valve is degenerative. Mild mitral valve regurgitation. No  evidence of mitral stenosis. Moderate mitral annular calcification.   5. The aortic valve is tricuspid. There is moderate calcification of the  aortic valve. Aortic valve regurgitation is not visualized. Moderate  aortic valve stenosis. Aortic valve area, by VTI measures 1.17 cm. Aortic  valve mean gradient measures 10.6  mmHg. Aortic valve Vmax measures 2.12 m/s.   6.  The inferior vena cava is normal in size with <50% respiratory  variability, suggesting right atrial pressure of 8 mmHg.   FINDINGS   Left Ventricle: Left ventricular ejection fraction, by estimation, is 45  to 50%. The left ventricle has mildly decreased function. The left  ventricle demonstrates global hypokinesis. The left ventricular internal  cavity size was normal in size. There is   mild concentric left ventricular hypertrophy. Left ventricular diastolic  parameters are indeterminate.   Right Ventricle: The right ventricular size is mildly enlarged. No  increase in right ventricular wall thickness. Right ventricular systolic  function is normal. There is mildly elevated pulmonary artery systolic  pressure. The tricuspid regurgitant  velocity is 2.95 m/s, and with an assumed right atrial pressure of 8 mmHg,  the estimated right ventricular systolic pressure is 42.8 mmHg.   Left [facility]: Left atrial size was moderately dilated.   Right [facility]: Right atrial size was normal in size.   Pericardium: There is no evidence of pericardial effusion.   The mitral valve is degenerative in appearance. There is mild  calcification of the mitral valve leaflet(s). Moderate mitral annular  calcification. Mild mitral valve regurgitation. No evidence of mitral  valve stenosis.  Tricuspid Valve: The tricuspid valve is normal in structure. Tricuspid  valve regurgitation is mild . No evidence of tricuspid stenosis.   The aortic valve is tricuspid. There is moderate calcification of the  aortic valve. Aortic valve regurgitation is not visualized. Moderate  aortic stenosis is present.  Pulmonic Valve: The pulmonic valve was normal in structure. Pulmonic valve  regurgitation is trivial. No evidence of pulmonic stenosis.   Aorta: The aortic root is normal in size and structure.   Venous: The inferior vena cava is normal in size with less than 50%  respiratory variability, suggesting right atrial  pressure of 8 mmHg.   IAS/Shunts: No atrial level shunt detected by color flow Doppler.        Assessment & Plan   1.  HFrEF-well compensated.  Euvolemic.  Weight today 184 lbs.  Echocardiogram 09/04/2023 showed an LVEF of 45-50%. Continue Daily weights Heart healthy low-sodium diet-reviewed Increase physical activity as tolerated Continue amlodipine , furosemide , metoprolol  Continue Entresto  49/51 Plan for repeat echocardiogram 11/25  Essential hypertension-BP today 99/70. Maintain blood pressure log Continue amlodipine , metoprolol  Continue Entresto   Paroxysmal atrial fibrillation-heart rate today 100 bpm.  Continue rate control therapy.  Denies bleeding issues. Avoid triggers caffeine, chocolate, EtOH, dehydration etc. Continue apixaban , metoprolol   Disposition: Follow-up with Dr. Mona, Hao Meng PA-C he or me in 4-6 month.   Josefa HERO. Levina Boyack NP-C     10/27/2023, 11:09 AM [facility] [address] [place], [place] [address] Office [phone]      I spent 14 minutes examining this patient, reviewing medications, and using patient centered shared decision making involving their cardiac care.   I spent  20 minutes reviewing past medical history,  medications, and prior cardiac tests.

## 2023-10-27 ENCOUNTER — Encounter: Payer: Self-pay | Admitting: General Practice

## 2023-10-27 ENCOUNTER — Ambulatory Visit: Attending: General Practice | Admitting: General Practice

## 2023-10-27 VITALS — BP 99/70 | HR 79 | Ht 68.0 in | Wt 184.4 lb

## 2023-10-27 DIAGNOSIS — I10 Essential (primary) hypertension: Secondary | ICD-10-CM | POA: Diagnosis not present

## 2023-10-27 DIAGNOSIS — I5020 Unspecified systolic (congestive) heart failure: Secondary | ICD-10-CM

## 2023-10-27 DIAGNOSIS — I480 Paroxysmal atrial fibrillation: Secondary | ICD-10-CM

## 2023-10-27 NOTE — Patient Instructions (Signed)
 Medication Instructions:  .isntcu  *If you need a refill on your cardiac medications before your next appointment, please call your pharmacy*  Lab Work: None ordered  If you have labs (blood work) drawn today and your tests are completely normal, you will receive your results only by: MyChart Message (if you have MyChart) OR A paper copy in the mail If you have any lab test that is abnormal or we need to change your treatment, we will call you to review the results.  Testing/Procedures: Your physician has requested that you have an echocardiogram. Echocardiography is a painless test that uses sound waves to create images of your heart. It provides your doctor with information about the size and shape of your heart and how well your heart's chambers and valves are working. This procedure takes approximately one hour. There are no restrictions for this procedure. Please do NOT wear cologne, perfume, aftershave, or lotions (deodorant is allowed). Please arrive 15 minutes prior to your appointment time.  Please note: We ask at that you not bring children with you during ultrasound (echo/ vascular) testing. Due to room size and safety concerns, children are not allowed in the ultrasound rooms during exams. Our front office staff cannot provide observation of children in our lobby area while testing is being conducted. An adult accompanying a patient to their appointment will only be allowed in the ultrasound room at the discretion of the ultrasound technician under special circumstances. We apologize for any inconvenience.   Follow-Up: At [facility], you and your health needs are our priority.  As part of our continuing mission to provide you with exceptional heart care, our providers are all part of one team.  This team includes your primary Cardiologist (physician) and Advanced Practice Providers or APPs (Physician Assistants and Nurse Practitioners) who all work together to provide you  with the care you need, when you need it.  Your next appointment:   4-6 month(s)  Provider:   Vinie JAYSON Maxcy, MD or Josefa Beauvais, NP          We recommend signing up for the patient portal called MyChart.  Sign up information is provided on this After Visit Summary.  MyChart is used to connect with patients for Virtual Visits (Telemedicine).  Patients are able to view lab/test results, encounter notes, upcoming appointments, etc.  Non-urgent messages can be sent to your provider as well.   To learn more about what you can do with MyChart, go to [email or web address].   Other Instructions

## 2023-11-06 ENCOUNTER — Ambulatory Visit: Admitting: Internal Medicine

## 2023-11-06 ENCOUNTER — Encounter: Payer: Self-pay | Admitting: Internal Medicine

## 2023-11-06 VITALS — BP 149/71 | HR 73 | Temp 97.5°F | Ht 68.0 in | Wt 188.2 lb

## 2023-11-06 DIAGNOSIS — Z79899 Other long term (current) drug therapy: Secondary | ICD-10-CM | POA: Diagnosis not present

## 2023-11-06 DIAGNOSIS — K7460 Unspecified cirrhosis of liver: Secondary | ICD-10-CM

## 2023-11-06 DIAGNOSIS — K219 Gastro-esophageal reflux disease without esophagitis: Secondary | ICD-10-CM | POA: Diagnosis not present

## 2023-11-06 DIAGNOSIS — Z87891 Personal history of nicotine dependence: Secondary | ICD-10-CM

## 2023-11-06 DIAGNOSIS — K760 Fatty (change of) liver, not elsewhere classified: Secondary | ICD-10-CM

## 2023-11-06 MED ORDER — PANTOPRAZOLE SODIUM 40 MG PO TBEC: 40.0000 mg | DELAYED_RELEASE_TABLET | Freq: Every day | ORAL | 11 refills | Status: AC

## 2023-11-06 NOTE — Patient Instructions (Signed)
 It was good to see you again today as discussed normal rabeprazole .  Begin pantoprazole  Protonix  40 mg daily 30 minutes before breakfast for acid reflux dispense 30 with 11 refills  Hepatic ultrasound in the near future-history of cirrhosis-screening  Take probiotic like digestive advantage or similar product for gas bloat every day  Office visit with me in 6 months

## 2023-11-06 NOTE — Progress Notes (Signed)
 Gastroenterology Progress Note    Primary Care Physician:  Patient, No Pcp Per Primary Gastroenterologist:  Dr. Shaaron  Pre-Procedure History & Physical: HPI:  [name] is a 89 y.o. male here for follow-up gas bloat GERD history of MASH felt to have well compensated cirrhosis;  prior EGD-minimal grade 1 varices on EGD last EGD demonstrated no evidence of varices or portal hypertension.  He declined nonselective beta-blocker previously maintained on Mettoprolol.  Continues to be anticoagulated.  Read somewhere that Aciphex  was bad long-term-he stopped it.  Dysphagia.  Overdue for screening ultrasound  Past Medical History:  Diagnosis Date   BPH (benign prostatic hyperplasia)    CAD (coronary artery disease) of artery bypass graft 02/04/2004   Fatty liver    GERD (gastroesophageal reflux disease)    Gilbert's syndrome    Hiatal hernia    History of nuclear stress test 11/04/2007   bruce myoview ; normal pattern of persuion in all regions; post-stress EF 70%; low risk    HOH (hard of hearing)    HTN (hypertension)    Neuropathy    PAF (paroxysmal atrial fibrillation) (HCC)    PVD (peripheral vascular disease)    0-49% R & L ICA stenosis (2013)    Renal artery aneurysm    S/P CABG (coronary artery bypass graft) 02/04/2004   Schatzki's ring    non critical   Throat cancer (HCC)    Thrombocytopenia     Past Surgical History:  Procedure Laterality Date   CARDIOVERSION N/A 01/14/2018   Procedure: CARDIOVERSION;  Surgeon: Loni Soyla LABOR, MD;  Location: [facility] ENDOSCOPY;  Service: Cardiovascular;  Laterality: N/A;   CATARACT EXTRACTION  2011   COLONOSCOPY  2006   Dr. Regina pancolonic diverticula   COLONOSCOPY  01/07/2012   Dr. Newman normal rectum, pancolonic diverticulosis, hyperplastic polyp   COLONOSCOPY N/A 03/20/2015   MFM:ejwrnonwpr diverticulosis   CORONARY ARTERY BYPASS GRAFT  04/2004   LIMA to diagonal, SVG to ramus intermedius, SVG to PDA;  placed stent for crossed coronary arteries (Dr. Fleeta Ochoa)   ESOPHAGEAL BANDING N/A 08/20/2022   Procedure: ESOPHAGEAL BANDING;  Surgeon: Shaaron Lamar HERO, MD;  Location: [facility] ENDO SUITE;  Service: Endoscopy;  Laterality: N/A;   ESOPHAGOGASTRODUODENOSCOPY  2006   Dr. Fuller Schatzki's ring, s/p 58-F Maloney dilation   ESOPHAGOGASTRODUODENOSCOPY  01/07/2012   Dr. Stephenie ring, hiatal hernia, fundic gland polyp   ESOPHAGOGASTRODUODENOSCOPY N/A 03/20/2015   MFM:hmjiz 1 varices/HH Gastric polpy s/p bx   ESOPHAGOGASTRODUODENOSCOPY N/A 07/28/2018   Dr. Shaaron: 3 short columns of grade 1 esophageal varices.  Esophageal dilation performed for dysphagia.  Multiple gastric polyps previously proved benign.   ESOPHAGOGASTRODUODENOSCOPY (EGD) WITH PROPOFOL  N/A 08/20/2022   Procedure: ESOPHAGOGASTRODUODENOSCOPY (EGD) WITH PROPOFOL ;  Surgeon: Shaaron Lamar HERO, MD;  Location: [facility] END[facility] SU[facility];  Service: Endoscopy;  Laterality: N/A;  11:00 am, asa 3   MALONEY DILATION N/A 07/28/2018   Procedure: MALONEY DILATION;  Surgeon: Shaaron Lamar HERO, MD;  Location: [facility] ENDO [facility];  Service: Endoscopy;  Laterality: N/A;   MALONEY DILATION N/A 08/20/2022   Procedure: AGAPITO DILATION;  Surgeon: Shaaron Lamar HERO, MD;  Location: [facility] ENDO SU[facility];  Service: Endoscopy;  Laterality: N/A;   rotater cuff Left    repair   TONSILLECTOMY      Prior to Admission medications   Medication Sig Start Date End Date Taking? Authorizing Provider  allopurinol (ZYLOPRIM) 100 MG tablet Take 100 mg by mouth at bedtime.    Yes [provider]  amLODipine  (NORVASC ) 5 MG tablet Take 1 tablet (5 mg total) by mouth daily. 02/07/22  Yes Hilty, Vinie BROCKS, MD  apixaban  (ELIQUIS ) 5 MG TABS tablet Take 1 tablet (5 mg total) by mouth 2 (two) times daily. 01/24/16  Yes Hilty, Vinie BROCKS, MD  betamethasone valerate (VALISONE) 0.1 % cream Apply topically 2 (two) times daily.   Yes [provider]  Ensure Plus (ENSURE PLUS) LIQD Take  237 mLs by mouth.   Yes [provider]  fluticasone  (FLONASE ) 50 MCG/ACT nasal spray Place 1 spray into both nostrils 2 (two) times a day.   Yes [provider]  furosemide  (LASIX ) 40 MG tablet Take 1 tablet by mouth once daily 09/12/22  Yes Hilty, Vinie BROCKS, MD  gabapentin  (NEURONTIN ) 300 MG capsule Take 300 mg by mouth 3 (three) times daily.     Yes [provider]  Levothyroxine Sodium 112 MCG CAPS Take 112 mcg by mouth daily before breakfast.    Yes [provider]  metoprolol  tartrate (LOPRESSOR ) 25 MG tablet Take 1 tablet by mouth twice daily 03/17/23  Yes Hilty, Vinie BROCKS, MD  Misc Natural Products (BEET ROOT PO) Take by mouth.   Yes [provider]  Multiple Vitamins-Minerals (PRESERVISION AREDS 2+MULTI VIT) CAPS Take 1 capsule by mouth 2 (two) times daily.   Yes [provider]  nitroGLYCERIN  (NITROSTAT ) 0.4 MG SL tablet DISSOLVE ONE TABLET UNDER THE TONGUE EVERY 5 MINUTES AS NEEDED FOR CHEST PAIN.  DO NOT EXCEED A TOTAL OF 3 DOSES IN 15 MINUTES 12/26/22  Yes Hilty, Vinie BROCKS, MD  RABEprazole  (ACIPHEX ) 20 MG tablet Take 1 tablet (20 mg total) by mouth 2 (two) times daily. 09/10/18  Yes Jancie Kercher, Lamar HERO, MD  sacubitril -valsartan  (ENTRESTO ) 49-51 MG Take 1 tablet by mouth 2 (two) times daily. 09/28/23  Yes Emelia Josefa HERO, NP  sildenafil (VIAGRA) 100 MG tablet Take 100 mg by mouth daily as needed for erectile dysfunction.   Yes [provider]  tamsulosin (FLOMAX) 0.4 MG CAPS capsule Take 0.8 mg by mouth at bedtime.   Yes [provider]  vitamin B-12 (CYANOCOBALAMIN) 500 MCG tablet Take 500 mcg by mouth daily.   Yes [provider]    Allergies as of 11/06/2023 - Review Complete 11/06/2023  Allergen Reaction Noted   Antihistamines, chlorpheniramine-type Rash 08/26/2011    Family History  Problem Relation Age of Onset   Heart attack Mother 43       deceased   Heart attack Father 42       deceased   Heart  disease Brother        x2   Diabetes Brother        x3   Colon cancer Neg Hx     Social History   Socioeconomic History   Marital status: Married    Spouse name: Not on file   Number of children: Not on file   Years of education: Not on file   Highest education level: Not on file  Occupational History   Not on file  Tobacco Use   Smoking status: Former    Current packs/day: 0.00    Average packs/day: 1 pack/day for 40.0 years (40.0 ttl pk-yrs)    Types: Cigarettes    Start date: 04/07/1964    Quit date: 04/07/2004    Years since quitting: 19.5   Smokeless tobacco: Never  Vaping Use   Vaping status: Never Used  Substance and Sexual Activity   Alcohol use: Yes  Comment: occassional drinker   Drug use: No   Sexual activity: Never  Other Topics Concern   Not on file  Social History Narrative   Not on file   Social Drivers of Health   Financial Resource Strain: Not on file  Food Insecurity: Low Risk  (04/09/2023)   Received from [facility]   Hunger Vital Sign    Within the past 12 months, you worried that your food would run out before you got money to buy more: Never true    Within the past 12 months, the food you bought just didn't last and you didn't have money to get more. : Never true  Transportation Needs: No Transportation Needs (04/09/2023)   Received from [facility]    In the past 12 months, has lack of reliable transportation kept you from medical appointments, meetings, work or from getting things needed for daily living? : No  Physical Activity: Not on file  Stress: Not on file  Social Connections: Not on file  Intimate Partner Violence: Not on file    Review of Systems   See HPI, otherwise negative ROS  Physical Exam: BP (!) 149/71 (BP Location: Right Arm, Patient Position: Sitting, Cuff Size: Normal)   Pulse 73   Temp (!) 97.5 F (36.4 C) (Oral)   Ht 5' 8 (1.727 m)   Wt 188 lb 3.2 oz (85.4 kg)   SpO2 100%   BMI 28.62 kg/m   General:   Elderly well-nourished, pleasant and cooperative in NAD Lungs:  Clear throughout to auscultation.   No wheezes, crackles, or rhonchi. No acute distress. Heart:  Regular rate and rhythm; no murmurs, clicks, rubs,  or gallops. Abdomen: Non-distended, normal bowel sounds.  Soft and nontender without appreciable mass or hepatosplenomegaly.    Impression/Plan:   89 year old gentleman with well with MASLD.;  I am not impressive that he has significant/ advanced chronic liver disease.  No evidence of portal hypertension on prior EGD.  Overdue on screening ultrasound prior AFP normal.  Has GERD and gas bloat symptoms.  Needs to be on a PPI.  Has taken a probiotic off and on in the past.   Recommendations:  Begin pantoprazole  Protonix  40 mg daily 30 minutes before breakfast for acid reflux dispense 30 with 11 refills  Hepatic ultrasound in the near future-history of cirrhosis-screening  Take probiotic like digestive advantage or similar product for gas bloat every day  Office visit with me in 6 months   Notice: This dictation was prepared with Dragon dictation along with smaller phrase technology. Any transcriptional errors that result from this process are unintentional and may not be corrected upon review.

## 2023-11-10 ENCOUNTER — Other Ambulatory Visit: Payer: Self-pay | Admitting: *Deleted

## 2023-11-10 DIAGNOSIS — K7460 Unspecified cirrhosis of liver: Secondary | ICD-10-CM

## 2023-11-19 ENCOUNTER — Ambulatory Visit (HOSPITAL_COMMUNITY)
Admission: RE | Admit: 2023-11-19 | Discharge: 2023-11-19 | Disposition: A | Discharge status: Home / Self Care | Source: Ambulatory Visit | Attending: Internal Medicine | Admitting: Internal Medicine

## 2023-11-19 DIAGNOSIS — K7460 Unspecified cirrhosis of liver: Secondary | ICD-10-CM | POA: Insufficient documentation

## 2023-11-24 ENCOUNTER — Ambulatory Visit: Payer: Self-pay | Admitting: Internal Medicine

## 2023-11-30 ENCOUNTER — Telehealth: Payer: Self-pay

## 2023-11-30 NOTE — Telephone Encounter (Signed)
 Contacted the patient and told him we are going to wait on his Echo. [facility] at their facility. He states if he comes to our office that he has to pay and he does not want to pay. He states he will follow up with them this week and see what they recommend.

## 2023-11-30 NOTE — Telephone Encounter (Signed)
-----   Message from Josefa CHRISTELLA Beauvais sent at 11/30/2023 10:06 AM EDT ----- Regarding: RE: Echo Appointment Canceled He may wait on his echocardiogram.  Thank you.  Josefa CHRISTELLA. Cleaver NP-C  [image]   11/30/2023, 10:07 AM [facility] [address] [place], [place] [address] Office [phone] ----- Message ----- From: Sebastian Janese GRADE, CMA Sent: 11/30/2023   9:34 AM EDT To: Josefa CHRISTELLA Beauvais, NP Subject: FW: Echo Appointment Canceled                  Walterine Josefa,   Does the patient still need the echo.  Thanks,  Ula, CMA ----- Message ----- From: Buster Jeoffrey CROME Sent: 11/30/2023   9:00 AM EDT To: Janese GRADE Sebastian, CMA; Romaine JINNY Silvan Subject: Echo Appointment Canceled                      Hi,  Patient had an echocardiogram scheduled for 10/31, but he canceled the appointment to have the Echo performed at the [facility] in [place]. Please advise if the Echo is still needed.  Thank you!

## 2023-12-02 ENCOUNTER — Other Ambulatory Visit: Payer: Self-pay | Admitting: Internal Medicine

## 2023-12-04 ENCOUNTER — Ambulatory Visit (HOSPITAL_COMMUNITY)

## 2023-12-28 ENCOUNTER — Other Ambulatory Visit: Payer: Self-pay | Admitting: Internal Medicine
